# Patient Record
Sex: Female | Born: 1985 | Race: White | Hispanic: No | Smoking: Current every day smoker
Health system: Southern US, Community
[De-identification: ages and names within clinical notes are randomized; demographics above are authoritative.]

## PROBLEM LIST (undated history)

## (undated) DIAGNOSIS — C799 Secondary malignant neoplasm of unspecified site: Secondary | ICD-10-CM

## (undated) DIAGNOSIS — F419 Anxiety disorder, unspecified: Secondary | ICD-10-CM

## (undated) DIAGNOSIS — E079 Disorder of thyroid, unspecified: Secondary | ICD-10-CM

## (undated) DIAGNOSIS — T7840XA Allergy, unspecified, initial encounter: Secondary | ICD-10-CM

## (undated) HISTORY — DX: Allergy, unspecified, initial encounter: T78.40XA

## (undated) HISTORY — DX: Anxiety disorder, unspecified: F41.9

## (undated) HISTORY — DX: Disorder of thyroid, unspecified: E07.9

---
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
## 2006-05-11 ENCOUNTER — Inpatient Hospital Stay (HOSPITAL_COMMUNITY): Admission: AD | Admit: 2006-05-11 | Discharge: 2006-05-13 | Payer: Self-pay | Admitting: Obstetrics & Gynecology

## 2006-11-30 ENCOUNTER — Emergency Department (HOSPITAL_COMMUNITY): Admission: EM | Admit: 2006-11-30 | Discharge: 2006-11-30 | Payer: Self-pay | Admitting: Emergency Medicine

## 2007-07-12 ENCOUNTER — Emergency Department (HOSPITAL_COMMUNITY): Admission: EM | Admit: 2007-07-12 | Discharge: 2007-07-13 | Payer: Self-pay | Admitting: Emergency Medicine

## 2007-07-14 ENCOUNTER — Emergency Department (HOSPITAL_COMMUNITY): Admission: EM | Admit: 2007-07-14 | Discharge: 2007-07-14 | Payer: Self-pay | Admitting: Emergency Medicine

## 2007-09-23 ENCOUNTER — Emergency Department (HOSPITAL_COMMUNITY): Admission: EM | Admit: 2007-09-23 | Discharge: 2007-09-23 | Payer: Self-pay | Admitting: Emergency Medicine

## 2007-12-09 ENCOUNTER — Emergency Department (HOSPITAL_COMMUNITY): Admission: EM | Admit: 2007-12-09 | Discharge: 2007-12-09 | Payer: Self-pay | Admitting: Emergency Medicine

## 2008-09-08 ENCOUNTER — Emergency Department (HOSPITAL_COMMUNITY): Admission: EM | Admit: 2008-09-08 | Discharge: 2008-09-08 | Payer: Self-pay | Admitting: Emergency Medicine

## 2009-02-15 ENCOUNTER — Emergency Department (HOSPITAL_COMMUNITY): Admission: EM | Admit: 2009-02-15 | Discharge: 2009-02-15 | Payer: Self-pay | Admitting: Emergency Medicine

## 2011-03-25 LAB — I-STAT 8, (EC8 V) (CONVERTED LAB)
Acid-Base Excess: 1
BUN: 6
Bicarbonate: 25.5 — ABNORMAL HIGH
Glucose, Bld: 141 — ABNORMAL HIGH
HCT: 35 — ABNORMAL LOW
HCT: 43
Hemoglobin: 11.9 — ABNORMAL LOW
Hemoglobin: 14.6
Potassium: 3.4 — ABNORMAL LOW
Sodium: 136
Sodium: 137
pH, Ven: 7.41 — ABNORMAL HIGH

## 2011-03-25 LAB — CBC
Hemoglobin: 10.3 — ABNORMAL LOW
MCHC: 33.2
Platelets: 265
WBC: 12.5 — ABNORMAL HIGH

## 2011-03-25 LAB — POCT I-STAT CREATININE
Creatinine, Ser: 0.9
Creatinine, Ser: 0.9
Operator id: 294341

## 2011-03-25 LAB — URINALYSIS, ROUTINE W REFLEX MICROSCOPIC
Bilirubin Urine: NEGATIVE
Glucose, UA: NEGATIVE
Hgb urine dipstick: NEGATIVE
Ketones, ur: NEGATIVE
Nitrite: NEGATIVE
Protein, ur: NEGATIVE
Specific Gravity, Urine: 1.016
Urobilinogen, UA: 1
pH: 7.5

## 2011-03-25 LAB — DIFFERENTIAL
Basophils Absolute: 0
Eosinophils Absolute: 0
Eosinophils Absolute: 0.2
Eosinophils Relative: 0
Eosinophils Relative: 3
Monocytes Absolute: 0.3
Neutro Abs: 3.3

## 2011-03-25 LAB — WET PREP, GENITAL: Yeast Wet Prep HPF POC: NONE SEEN

## 2011-03-25 LAB — GC/CHLAMYDIA PROBE AMP, GENITAL: Chlamydia, DNA Probe: POSITIVE — AB

## 2011-03-29 LAB — URINALYSIS, ROUTINE W REFLEX MICROSCOPIC
Glucose, UA: NEGATIVE
Hgb urine dipstick: NEGATIVE
Ketones, ur: NEGATIVE
Protein, ur: NEGATIVE
Urobilinogen, UA: 0.2

## 2011-03-29 LAB — CBC
HCT: 40.5
Hemoglobin: 13.4
MCHC: 33.2
Platelets: 218
RDW: 16 — ABNORMAL HIGH
WBC: 5

## 2011-03-29 LAB — DIFFERENTIAL
Basophils Relative: 0
Eosinophils Absolute: 0.1
Eosinophils Relative: 3
Lymphs Abs: 1.8
Monocytes Relative: 6
Neutrophils Relative %: 55

## 2011-03-29 LAB — I-STAT 8, (EC8 V) (CONVERTED LAB)
BUN: 8
Glucose, Bld: 83
Hemoglobin: 14.6
Potassium: 4
Sodium: 141
pH, Ven: 7.354 — ABNORMAL HIGH

## 2011-03-29 LAB — POCT PREGNANCY, URINE: Preg Test, Ur: NEGATIVE

## 2011-03-29 LAB — POCT I-STAT CREATININE: Operator id: 265201

## 2011-04-01 LAB — DIFFERENTIAL
Basophils Relative: 0
Eosinophils Relative: 0
Lymphocytes Relative: 8 — ABNORMAL LOW
Monocytes Absolute: 0.5
Monocytes Relative: 4
Neutro Abs: 10 — ABNORMAL HIGH

## 2011-04-01 LAB — COMPREHENSIVE METABOLIC PANEL
AST: 20
Albumin: 4.1
Alkaline Phosphatase: 55
BUN: 6
GFR calc Af Amer: >60
Potassium: 3.5
Total Protein: 7.2

## 2011-04-01 LAB — POCT PREGNANCY, URINE
Operator id: 151321
Preg Test, Ur: NEGATIVE

## 2011-04-01 LAB — URINALYSIS, ROUTINE W REFLEX MICROSCOPIC
Ketones, ur: 15 — AB
Nitrite: NEGATIVE
Protein, ur: NEGATIVE
Urobilinogen, UA: 1

## 2011-04-01 LAB — CBC
HCT: 36.1
Platelets: 199
RDW: 13.8
WBC: 11.4 — ABNORMAL HIGH

## 2011-04-01 LAB — MONONUCLEOSIS SCREEN: Mono Screen: NEGATIVE

## 2019-05-06 ENCOUNTER — Emergency Department (HOSPITAL_COMMUNITY)
Admission: EM | Admit: 2019-05-06 | Discharge: 2019-05-06 | Disposition: A | Discharge status: Home / Self Care | Payer: Self-pay | Attending: Emergency Medicine | Admitting: Emergency Medicine

## 2019-05-06 ENCOUNTER — Encounter (HOSPITAL_COMMUNITY): Payer: Self-pay | Admitting: Emergency Medicine

## 2019-05-06 ENCOUNTER — Other Ambulatory Visit: Payer: Self-pay

## 2019-05-06 DIAGNOSIS — M25561 Pain in right knee: Secondary | ICD-10-CM | POA: Insufficient documentation

## 2019-05-06 DIAGNOSIS — Y999 Unspecified external cause status: Secondary | ICD-10-CM | POA: Insufficient documentation

## 2019-05-06 DIAGNOSIS — F17200 Nicotine dependence, unspecified, uncomplicated: Secondary | ICD-10-CM | POA: Insufficient documentation

## 2019-05-06 DIAGNOSIS — S39012A Strain of muscle, fascia and tendon of lower back, initial encounter: Secondary | ICD-10-CM | POA: Insufficient documentation

## 2019-05-06 DIAGNOSIS — Y92481 Parking lot as the place of occurrence of the external cause: Secondary | ICD-10-CM | POA: Insufficient documentation

## 2019-05-06 DIAGNOSIS — Y9389 Activity, other specified: Secondary | ICD-10-CM | POA: Insufficient documentation

## 2019-05-06 DIAGNOSIS — M25512 Pain in left shoulder: Secondary | ICD-10-CM | POA: Insufficient documentation

## 2019-05-06 MED ORDER — OXYCODONE HCL 5 MG PO TABS
5.0000 mg | ORAL_TABLET | Freq: Once | ORAL | Status: AC
  Administered 2019-05-06: 08:00:00 5 mg via ORAL
  Filled 2019-05-06: qty 1

## 2019-05-06 MED ORDER — ACETAMINOPHEN 500 MG PO TABS
1000.0000 mg | ORAL_TABLET | Freq: Once | ORAL | Status: AC
  Administered 2019-05-06: 08:00:00 1000 mg via ORAL
  Filled 2019-05-06: qty 2

## 2019-05-06 MED ORDER — KETOROLAC TROMETHAMINE 60 MG/2ML IM SOLN
15.0000 mg | Freq: Once | INTRAMUSCULAR | Status: DC
  Filled 2019-05-06: qty 2
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
## 2019-05-06 NOTE — ED Notes (Signed)
Patient verbalizes understanding of discharge instructions . Opportunity for questions and answers were provided . Armband removed by staff ,Pt discharged from ED. W/C  offered at D/C  and Declined W/C at D/C and was escorted to lobby by RN.  

## 2019-05-06 NOTE — Discharge Instructions (Signed)
Take 4 over the counter ibuprofen tablets 3 times a day or 2 over-the-counter naproxen tablets twice a day for pain. Also take tylenol 1000mg (2 extra strength) four times a day.   If your pain continues after a week then it may be reasonable to obtain imaging.  Please try and rest the areas as best you can.  Your back is something that needs to be up and moving so you cannot stay in a bed all day have to get up and move around.  Try not to lift anything greater than 10 pounds or bend or twist at the waist.
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
## 2019-12-18 ENCOUNTER — Emergency Department (HOSPITAL_COMMUNITY)
Admission: EM | Admit: 2019-12-18 | Discharge: 2019-12-18 | Discharge status: Against Medical Advice | Payer: Self-pay | Attending: Emergency Medicine | Admitting: Emergency Medicine

## 2019-12-18 ENCOUNTER — Emergency Department (HOSPITAL_COMMUNITY): Payer: Self-pay

## 2019-12-18 ENCOUNTER — Encounter (HOSPITAL_COMMUNITY): Payer: Self-pay | Admitting: Emergency Medicine

## 2019-12-18 ENCOUNTER — Other Ambulatory Visit: Payer: Self-pay

## 2019-12-18 DIAGNOSIS — R0789 Other chest pain: Secondary | ICD-10-CM | POA: Insufficient documentation

## 2019-12-18 DIAGNOSIS — Z5321 Procedure and treatment not carried out due to patient leaving prior to being seen by health care provider: Secondary | ICD-10-CM | POA: Insufficient documentation

## 2019-12-18 LAB — BASIC METABOLIC PANEL
Anion gap: 10 (ref 5–15)
BUN: <5 mg/dL — ABNORMAL LOW (ref 6–20)
CO2: 24 mmol/L (ref 22–32)
Calcium: 9 mg/dL (ref 8.9–10.3)
Chloride: 105 mmol/L (ref 98–111)
Creatinine, Ser: 0.54 mg/dL (ref 0.44–1.00)
GFR calc Af Amer: >60 mL/min (ref >60)
GFR calc non Af Amer: >60 mL/min (ref >60)
Glucose, Bld: 85 mg/dL (ref 70–99)
Potassium: 4 mmol/L (ref 3.5–5.1)
Sodium: 139 mmol/L (ref 135–145)

## 2019-12-18 LAB — CBC
HCT: 45.7 % (ref 36.0–46.0)
Hemoglobin: 15.6 g/dL — ABNORMAL HIGH (ref 12.0–15.0)
MCH: 31.6 pg (ref 26.0–34.0)
MCHC: 34.1 g/dL (ref 30.0–36.0)
MCV: 92.5 fL (ref 80.0–100.0)
Platelets: 203 10*3/uL (ref 150–400)
RBC: 4.94 MIL/uL (ref 3.87–5.11)
RDW: 12.8 % (ref 11.5–15.5)
WBC: 5.6 10*3/uL (ref 4.0–10.5)
nRBC: 0 % (ref 0.0–0.2)

## 2019-12-18 LAB — TROPONIN I (HIGH SENSITIVITY): Troponin I (High Sensitivity): 3 ng/L (ref <18)

## 2019-12-18 MED ORDER — SODIUM CHLORIDE 0.9% FLUSH: 3.0000 mL | Freq: Once | INTRAVENOUS | Status: DC

## 2019-12-18 NOTE — ED Triage Notes (Signed)
Patient arrives to ED with complaints of left sided hand pain starting last week. Patient states since the hand pain started she has gotten chest pains intermittently. The hand pain is constant. Patient stated that her left hand started to hurt today as well.

## 2019-12-18 NOTE — ED Notes (Signed)
Registration staff reported that patient left the ER.

## 2019-12-19 ENCOUNTER — Encounter (HOSPITAL_COMMUNITY): Payer: Self-pay

## 2019-12-19 ENCOUNTER — Emergency Department (HOSPITAL_COMMUNITY)
Admission: EM | Admit: 2019-12-19 | Discharge: 2019-12-19 | Disposition: A | Discharge status: Home / Self Care | Payer: PRIVATE HEALTH INSURANCE | Attending: Emergency Medicine | Admitting: Emergency Medicine

## 2019-12-19 ENCOUNTER — Other Ambulatory Visit: Payer: Self-pay

## 2019-12-19 DIAGNOSIS — Z880 Allergy status to penicillin: Secondary | ICD-10-CM | POA: Insufficient documentation

## 2019-12-19 DIAGNOSIS — G5601 Carpal tunnel syndrome, right upper limb: Secondary | ICD-10-CM

## 2019-12-19 DIAGNOSIS — F17210 Nicotine dependence, cigarettes, uncomplicated: Secondary | ICD-10-CM | POA: Insufficient documentation

## 2019-12-19 MED ORDER — DEXAMETHASONE SODIUM PHOSPHATE 10 MG/ML IJ SOLN
10.0000 mg | Freq: Once | INTRAMUSCULAR | Status: DC
  Filled 2019-12-19: qty 1

## 2019-12-19 MED ORDER — DEXAMETHASONE SODIUM PHOSPHATE 10 MG/ML IJ SOLN
10.0000 mg | Freq: Once | INTRAMUSCULAR | Status: AC
  Administered 2019-12-19: 10 mg via INTRAMUSCULAR

## 2019-12-19 NOTE — ED Triage Notes (Addendum)
Patient reports right and numbness and swelling and causes sharp pain to radiate up her arm. Patient states now her left hand is hurting.  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   10/10 pain   Patient seen yesterday at [place]. Blood work, Editor, commissioning, and chest xray in chart.

## 2019-12-19 NOTE — Discharge Instructions (Signed)
Please continue to take ibuprofen for management of your pain.  I would recommend 2 to 400 mg of ibuprofen 2-3 times per day.  Please be sure to take this medication with food to prevent stomach irritation.  Please wear the wrist splints as much as you can tolerate.  Particularly, while you are at work.  If your symptoms worsen you can always return to the emergency department.  Please follow-up with  community health and wellness regarding this visit as well as your symptoms.  It was a pleasure to meet you.

## 2019-12-19 NOTE — ED Provider Notes (Signed)
[place] [facility] DEPT Provider Note   CSN: [id number] Arrival date & time: 12/19/19  1248     History Chief Complaint  Patient presents with  . Hand Pain    Natalie Hinton is a 34 y.o. female.  HPI   Patient is a 34 year old female that is complaining with right greater than left wrist pain.  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  Her symptoms started shortly thereafter.  Patient reports 10/10 pain along the volar surface of the right wrist that radiates through her hand and upper arm.  In the last few days she additionally reports mild pain that began in the same region of her left hand.  Her symptoms are worst at night.  She reports associated numbness and tingling when the pain radiates.  She is right-hand dominant.  She denies fevers, chills, chest pain, shortness of breath.     History reviewed. No pertinent past medical history.  There are no problems to display for this patient.   History reviewed. No pertinent surgical history.   OB History   No obstetric history on file.     No family history on file.  Social History   Tobacco Use  . Smoking status: Current Every Day Smoker  . Smokeless tobacco: Never Used  Substance Use Topics  . Alcohol use: Never  . Drug use: Yes    Types: Marijuana    Home Medications Prior to Admission medications   Not on File    Allergies    Penicillins  Review of Systems   Review of Systems  Constitutional: Negative for chills and fever.  Respiratory: Negative for shortness of breath.   Cardiovascular: Negative for chest pain.  Musculoskeletal: Positive for arthralgias and myalgias.  Neurological: Positive for weakness and numbness.   Physical Exam Updated Vital Signs BP 116/81   Pulse 93   Temp 97.8 F (36.6 C) (Oral)   Resp 16   LMP 11/18/2019   SpO2 99%   Physical Exam Vitals and nursing note reviewed.  Constitutional:      General:  She is not in acute distress.    Appearance: Normal appearance. She is not ill-appearing, toxic-appearing or diaphoretic.  HENT:     Head: Normocephalic and atraumatic.     Right Ear: External ear normal.     Left Ear: External ear normal.     Nose: Nose normal.     Mouth/Throat:     Mouth: Mucous membranes are moist.     Pharynx: Oropharynx is clear. No oropharyngeal exudate or posterior oropharyngeal erythema.  Eyes:     Extraocular Movements: Extraocular movements intact.  Cardiovascular:     Rate and Rhythm: Normal rate and regular rhythm.     Pulses: Normal pulses.     Heart sounds: Normal heart sounds. No murmur heard.  No friction rub. No gallop.   Pulmonary:     Effort: Pulmonary effort is normal. No respiratory distress.     Breath sounds: Normal breath sounds. No stridor. No wheezing, rhonchi or rales.  Abdominal:     General: Abdomen is flat.     Tenderness: There is no abdominal tenderness.  Musculoskeletal:        General: Tenderness present. No swelling, deformity or signs of injury. Normal range of motion.     Cervical back: Normal range of motion and neck supple. No tenderness.     Comments: Exquisite TTP noted over the volar  surface of the bilateral wrists, right greater than left.  Positive Tinel's sign.  Positive Phalen test.  Full range of motion of the digits of both hands as well as both wrists.  Unable to assess grip strength of the right hand secondary to pain.  Skin:    General: Skin is warm and dry.  Neurological:     General: No focal deficit present.     Mental Status: She is alert and oriented to person, place, and time.     Comments: Distal sensation intact in all the fingers of the hands.  Psychiatric:        Mood and Affect: Mood normal.        Behavior: Behavior normal.    ED Results / Procedures / Treatments   Labs (all labs ordered are listed, but only abnormal results are displayed) Labs Reviewed - No data to  display  EKG None  Radiology DG Chest 2 View  Result Date: 12/18/2019 CLINICAL DATA:  Onset left hand pain 1 week ago. EXAM: CHEST - 2 VIEW COMPARISON:  PA and lateral chest 12/09/2007. FINDINGS: Lungs clear. Heart size normal. No pneumothorax or pleural fluid. No bony abnormality. IMPRESSION: Normal chest. Electronically Signed   By: Inge Rise M.D.   On: 12/18/2019 17:36   DG Hand 2 View Right  Result Date: 12/18/2019 CLINICAL DATA:  Hand pain EXAM: RIGHT HAND - 2 VIEW COMPARISON:  None. FINDINGS: Frontal and lateral views of the right hand are obtained. No fracture, subluxation, or dislocation. Joint spaces are well preserved. Soft tissues are normal. IMPRESSION: 1. Unremarkable right hand. Electronically Signed   By: Randa Ngo M.D.   On: 12/18/2019 17:53   Procedures Procedures   Medications Ordered in ED Medications - No data to display  ED Course  I have reviewed the triage vital signs and the nursing notes.  Pertinent labs & imaging results that were available during my care of the patient were reviewed by me and considered in my medical decision making (see chart for details).    MDM Rules/Calculators/A&P                          Patient is a 34 year old female that presents due to signs and symptoms consistent with carpal tunnel syndrome.  Patient does not have health insurance. She was seen yesterday in the emergency department but left prior to being evaluated by an EDP. She had an x-ray performed of the right hand which was negative. Physical exam today was significant for positive Tinel's sign and Phalen's test. Her symptoms seem to be worst at night.  I am going to give the patient a referral to [facility] community health and wellness so that she has PCP follow-up following this visit. She was given 10 mg of Decadron here in the emergency department. I recommended a course of ibuprofen for management of her pain and to reduce inflammation as well. We discussed  proper dosing. Patient was given bilateral wrist splints.  Patient was given return precautions. She understands she can return to the emergency department with any new or worsening symptoms. Her questions were answered and she was amicable at the time of discharge. Her vital signs are stable.  Patient discharged to home/self care.  Condition at discharge: Stable  Note: Portions of this report may have been transcribed using voice recognition software. Every effort was made to ensure accuracy; however, inadvertent computerized transcription errors may be present.  Final Clinical Impression(s) / ED Diagnoses Final diagnoses:  Carpal tunnel syndrome of right wrist    Rx / DC Orders ED Discharge Orders    None       Rayna Sexton, PA-C 12/19/19 1444    Long, [facility], MD 12/20/19 0740

## 2019-12-28 ENCOUNTER — Ambulatory Visit: Payer: Self-pay | Attending: Internal Medicine | Admitting: Family

## 2019-12-28 ENCOUNTER — Other Ambulatory Visit: Payer: Self-pay

## 2019-12-28 DIAGNOSIS — G5601 Carpal tunnel syndrome, right upper limb: Secondary | ICD-10-CM

## 2019-12-28 DIAGNOSIS — Z09 Encounter for follow-up examination after completed treatment for conditions other than malignant neoplasm: Secondary | ICD-10-CM

## 2019-12-28 NOTE — Progress Notes (Signed)
Virtual Visit via Telephone Note  I connected with Natalie Hinton, on 12/28/2019 at 9:49 AM by telephone due to the COVID-19 pandemic and verified that I am speaking with the correct person using two identifiers.  Due to current restrictions/limitations of in-office visits due to the COVID-19 pandemic, this scheduled clinical appointment was converted to a telehealth visit.   Consent: I discussed the limitations, risks, security and privacy concerns of performing an evaluation and management service by telephone and the availability of in person appointments. I also discussed with the patient that there may be a patient responsible charge related to this service. The patient expressed understanding and agreed to proceed.  Location of Patient: Home  Location of Provider: [facility] and [place] participating in Telemedicine visit: Sunday M Sedonia Small, NP Orlan Leavens, CMA  History of Present Illness: Natalie Hinton is a 34 year old female without significant medical history on file who presents for hospital follow-up.   1. ER FOLLOW UP: Time since discharge: 9 days Hospital/facility: [facility] emergency department  Diagnosis: carpal tunnel syndrome of right wrist Procedures/tests: chest x-ray, right hand x-ray Consultants: none New medications: none Discharge instructions:  Ibuprofen for management of pain and to reduce inflammation also given bilateral wrist splints Status: stable  Today reports right hand is still numb and lasts all day. Radiation to right arm. Reports swelling is gone. Reports she has been using splints and taking Ibuprofen as prescribed and that it is helping some. Reports aggravating factor is when the right wrist is touched. Reports she does lay on hands at night. 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   No past medical history on file. Allergies  Allergen Reactions  . Penicillins     No  current outpatient medications on file prior to visit.   No current facility-administered medications on file prior to visit.    Observations/Objective: Alert and oriented x 3. Not in acute distress. Physical examination not completed as this is a telemedicine visit.  Assessment and Plan: 1. Hospital discharge follow-up: -Last visit at the [facility] emergency department. During that encounter patient discharged home with Ibuprofen for management of pain and to reduce inflammation also given bilateral wrist splints. -Today patient reports she is in stable condition since discharge from hospital.  2. Carpal tunnel syndrome of right wrist: -Patient with right wrist carpal tunnel syndrome. Reports continued numbness with radiation to the right forearm. Numbness lasts all day. Denies swelling. 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 -Continue Ibuprofen, ice, rest, hand exercises, and wrist splint as needed. -Follow-up with primary physician if symptoms worsen and or becomes severe. Patient verbalized understanding. -Patient was given clear instructions to go to Emergency Department or return to medical center if symptoms don't improve, worsen, or new problems develop.The patient verbalized understanding.  Follow Up Instructions: Patient was given clear instructions to go to Emergency Department or return to medical center if symptoms don't improve, worsen, or new problems develop.The patient verbalized understanding.  I discussed the assessment and treatment plan with the patient. The patient was provided an opportunity to ask questions and all were answered. The patient agreed with the plan and demonstrated an understanding of the instructions.   The patient was advised to call back or seek an in-person evaluation if the symptoms worsen or if the condition fails to improve as anticipated.  I provided 10 minutes total of non-face-to-face time during this encounter  including median  intraservice time, reviewing previous notes, labs, imaging, medications, management and patient verbalized understanding.   Camillia Herter, NP  [facility] [place], [place]   12/28/2019, 9:37 AM

## 2019-12-28 NOTE — Patient Instructions (Addendum)
Carpal Tunnel Syndrome  Carpal tunnel syndrome is a condition that causes pain in your hand and arm. The carpal tunnel is a narrow area that is on the palm side of your wrist. Repeated wrist motion or certain diseases may cause swelling in the tunnel. This swelling can pinch the main nerve in the wrist (median nerve). What are the causes? This condition may be caused by:  Repeated wrist motions.  Wrist injuries.  Arthritis.  A sac of fluid (cyst) or abnormal growth (tumor) in the carpal tunnel.  Fluid buildup during pregnancy. Sometimes the cause is not known. What increases the risk? The following factors may make you more likely to develop this condition:  Having a job in which you move your wrist in the same way many times. This includes jobs like being a butcher or a cashier.  Being a woman.  Having other health conditions, such as: ? Diabetes. ? Obesity. ? A thyroid gland that is not active enough (hypothyroidism). ? Kidney failure. What are the signs or symptoms? Symptoms of this condition include:  A tingling feeling in your fingers.  Tingling or a loss of feeling (numbness) in your hand.  Pain in your entire arm. This pain may get worse when you bend your wrist and elbow for a long time.  Pain in your wrist that goes up your arm to your shoulder.  Pain that goes down into your palm or fingers.  A weak feeling in your hands. You may find it hard to grab and hold items. You may feel worse at night. How is this diagnosed? This condition is diagnosed with a medical history and physical exam. You may also have tests, such as:  Electromyogram (EMG). This test checks the signals that the nerves send to the muscles.  Nerve conduction study. This test checks how well signals pass through your nerves.  Imaging tests, such as X-rays, ultrasound, and MRI. These tests check for what might be the cause of your condition. How is this treated? This condition may be treated  with:  Lifestyle changes. You will be asked to stop or change the activity that caused your problem.  Doing exercise and activities that make bones and muscles stronger (physical therapy).  Learning how to use your hand again (occupational therapy).  Medicines for pain and swelling (inflammation). You may have injections in your wrist.  A wrist splint.  Surgery. Follow these instructions at home: If you have a splint:  Wear the splint as told by your doctor. Remove it only as told by your doctor.  Loosen the splint if your fingers: ? Tingle. ? Lose feeling (become numb). ? Turn cold and blue.  Keep the splint clean.  If the splint is not waterproof: ? Do not let it get wet. ? Cover it with a watertight covering when you take a bath or a shower. Managing pain, stiffness, and swelling   If told, put ice on the painful area: ? If you have a removable splint, remove it as told by your doctor. ? Put ice in a plastic bag. ? Place a towel between your skin and the bag. ? Leave the ice on for 20 minutes, 2-3 times per day. General instructions  Take over-the-counter and prescription medicines only as told by your doctor.  Rest your wrist from any activity that may cause pain. If needed, talk with your boss at work about changes that can help your wrist heal.  Do any exercises as told by your doctor,   physical therapist, or occupational therapist.  Keep all follow-up visits as told by your doctor. This is important. Contact a doctor if:  You have new symptoms.  Medicine does not help your pain.  Your symptoms get worse. Get help right away if:  You have very bad numbness or tingling in your wrist or hand. Summary  Carpal tunnel syndrome is a condition that causes pain in your hand and arm.  It is often caused by repeated wrist motions.  Lifestyle changes and medicines are used to treat this problem. Surgery may help in very bad cases.  Follow your doctor's  instructions about wearing a splint, resting your wrist, keeping follow-up visits, and calling for help. This information is not intended to replace advice given to you by your health care provider. Make sure you discuss any questions you have with your health care provider. Document Revised: 10/28/2017 Document Reviewed: 10/28/2017 Elsevier Patient Education  2020 [employer]  

## 2020-01-16 ENCOUNTER — Emergency Department (HOSPITAL_COMMUNITY): Payer: No Typology Code available for payment source

## 2020-01-16 ENCOUNTER — Encounter (HOSPITAL_COMMUNITY): Payer: Self-pay

## 2020-01-16 ENCOUNTER — Emergency Department (HOSPITAL_COMMUNITY)
Admission: EM | Admit: 2020-01-16 | Discharge: 2020-01-16 | Disposition: A | Discharge status: Home / Self Care | Payer: No Typology Code available for payment source | Attending: Emergency Medicine | Admitting: Emergency Medicine

## 2020-01-16 ENCOUNTER — Other Ambulatory Visit: Payer: Self-pay

## 2020-01-16 DIAGNOSIS — Y929 Unspecified place or not applicable: Secondary | ICD-10-CM | POA: Insufficient documentation

## 2020-01-16 DIAGNOSIS — Y939 Activity, unspecified: Secondary | ICD-10-CM | POA: Diagnosis not present

## 2020-01-16 DIAGNOSIS — F17200 Nicotine dependence, unspecified, uncomplicated: Secondary | ICD-10-CM | POA: Diagnosis not present

## 2020-01-16 DIAGNOSIS — S39012A Strain of muscle, fascia and tendon of lower back, initial encounter: Secondary | ICD-10-CM

## 2020-01-16 DIAGNOSIS — Y999 Unspecified external cause status: Secondary | ICD-10-CM | POA: Diagnosis not present

## 2020-01-16 DIAGNOSIS — W010XXA Fall on same level from slipping, tripping and stumbling without subsequent striking against object, initial encounter: Secondary | ICD-10-CM | POA: Diagnosis not present

## 2020-01-16 DIAGNOSIS — S34109A Unspecified injury to unspecified level of lumbar spinal cord, initial encounter: Secondary | ICD-10-CM | POA: Diagnosis present

## 2020-01-16 LAB — PREGNANCY, URINE: Preg Test, Ur: NEGATIVE

## 2020-01-16 MED ORDER — NAPROXEN 500 MG PO TABS
500.0000 mg | ORAL_TABLET | Freq: Two times a day (BID) | ORAL | 0 refills | Status: DC
Start: 2020-01-16 — End: 2022-07-14

## 2020-01-16 NOTE — Discharge Instructions (Signed)
Take the medications to help with your pain. Follow-up with your primary care provider. Return to the ER for worsening pain, additional injuries or falls, losing control of your bowels or bladder, numbness or weakness in your legs.

## 2020-01-16 NOTE — ED Triage Notes (Addendum)
Pt states Monday night at work she fell on her back. Pt states since then she has had lower right back pain that radiates to her tail bone. Pt states she took ibuprofen for her back pain yesterday with some relief. Pt denies hitting her head of LOC. Pt is ambulatory and A&Ox4.

## 2020-01-16 NOTE — ED Provider Notes (Signed)
[facility] DEPT Provider Note   CSN: [id number] Arrival date & time: 01/16/20  1537     History Chief Complaint  Patient presents with  . Back Pain    Natalie Hinton is a 34 y.o. female who presents to ED with a chief complaint of lower back pain and tailbone pain.  States that 2 days ago she slipped on a tomato on the floor at work and landed on her back.  She denies any head injury or loss of consciousness.  Since then she has had aching pain in her lower back near her tailbone.  She took ibuprofen yesterday with some improvement in her symptoms.  Pain is worse with palpation and ambulation.  Denies any loss of bowel or bladder function, fever, prior back surgeries, numbness in arms or legs, dysuria.  She denies possibility of pregnancy.  HPI     History reviewed. No pertinent past medical history.  There are no problems to display for this patient.   History reviewed. No pertinent surgical history.   OB History   No obstetric history on file.     History reviewed. No pertinent family history.  Social History   Tobacco Use  . Smoking status: Current Every Day Smoker  . Smokeless tobacco: Never Used  Substance Use Topics  . Alcohol use: Never  . Drug use: Yes    Types: Marijuana    Home Medications Prior to Admission medications   Medication Sig Start Date End Date Taking? Authorizing Provider  naproxen (NAPROSYN) 500 MG tablet Take 1 tablet (500 mg total) by mouth 2 (two) times daily. 01/16/20   Sherrye Puga, PA-C    Allergies    Penicillins  Review of Systems   Review of Systems  Constitutional: Negative for chills and fever.  Genitourinary: Negative for dysuria.  Musculoskeletal: Positive for back pain.  Skin: Negative for wound.  Neurological: Negative for weakness and numbness.    Physical Exam Updated Vital Signs BP 140/89   Pulse 97   Temp 98.1 F (36.7 C)   Ht 5' (1.524 m)   Wt 49.9 kg   SpO2 99%   BMI  21.48 kg/m   Physical Exam Vitals and nursing note reviewed.  Constitutional:      General: She is not in acute distress.    Appearance: She is well-developed. She is not diaphoretic.  HENT:     Head: Normocephalic and atraumatic.  Eyes:     General: No scleral icterus.    Conjunctiva/sclera: Conjunctivae normal.  Pulmonary:     Effort: Pulmonary effort is normal. No respiratory distress.  Musculoskeletal:     Cervical back: Normal range of motion.     Lumbar back: Tenderness present.       Back:     Comments: TTP of the lumbar spine at the midline.  No step-off palpated. No visible bruising, edema or temperature change noted. No objective signs of numbness present. No saddle anesthesia. 2+ DP pulses bilaterally. Sensation intact to light touch. Strength 5/5 in bilateral lower extremities.  Skin:    Findings: No rash.  Neurological:     Mental Status: She is alert.     ED Results / Procedures / Treatments   Labs (all labs ordered are listed, but only abnormal results are displayed) Labs Reviewed  PREGNANCY, URINE    EKG None  Radiology DG Lumbar Spine Complete  Result Date: 01/16/2020 CLINICAL DATA:  [place] at work 2 days ago, lower right back pain  radiating to coccyx EXAM: LUMBAR SPINE - COMPLETE 4+ VIEW COMPARISON:  None. FINDINGS: Frontal, bilateral oblique, lateral views of the lumbar spine are obtained. There are 5 non-rib-bearing lumbar type vertebral bodies in anatomic alignment. No fractures. Disc spaces are well preserved. Sacroiliac joints are normal. IMPRESSION: 1. Unremarkable lumbar spine. Electronically Signed   By: Randa Ngo M.D.   On: 01/16/2020 17:46   DG Sacrum/Coccyx  Result Date: 01/16/2020 CLINICAL DATA:  Fall on back. Fell Monday night at work. Lower right back pain radiating to the tailbone. EXAM: SACRUM AND COCCYX - 2+ VIEW COMPARISON:  None. FINDINGS: The cortical margins of the sacrum and coccyx are intact. The sacral ala are maintained. There  is no evidence of fracture or other focal bone lesions. Sacroiliac joints are congruent. IMPRESSION: Negative radiographs of the sacrum and coccyx. Electronically Signed   By: Keith Rake M.D.   On: 01/16/2020 17:45    Procedures Procedures (including critical care time)  Medications Ordered in ED Medications - No data to display  ED Course  I have reviewed the triage vital signs and the nursing notes.  Pertinent labs & imaging results that were available during my care of the patient were reviewed by me and considered in my medical decision making (see chart for details).    MDM Rules/Calculators/A&P                          34 year old female presenting to the ED with a chief complaint of lower back pain and tailbone pain after fall onto her back 2 days ago while at work.  Reports mechanical fall.  Denies any head injury or loss of consciousness.  Some improvement noted with NSAIDs.  No loss of bowel or bladder function, fever, prior back surgeries, numbness in arms or legs.  On exam there is tenderness palpation of the lower lumbar spine at the midline.  She remains ambulatory without weakness or objective signs of numbness noted in her lower extremities.  X-rays of the sacrum, coccyx and lumbar spine show no acute abnormalities.  Suspect that symptoms could be due to contusion.  No evidence of cauda equina, other infectious or vascular cause of symptoms.  Will treat with NSAIDs, heat and PCP follow-up. Pregnancy test negative here.  All imaging, if done today, including plain films, CT scans, and ultrasounds, independently reviewed by me, and interpretations confirmed via formal radiology reads.  Patient is hemodynamically stable, in NAD, and able to ambulate in the ED. Evaluation does not show pathology that would require ongoing emergent intervention or inpatient treatment. I explained the diagnosis to the patient. Pain has been managed and has no complaints prior to discharge.  Patient is comfortable with above plan and is stable for discharge at this time. All questions were answered prior to disposition. Strict return precautions for returning to the ED were discussed. Encouraged follow up with PCP.   An After Visit Summary was printed and given to the patient.   Portions of this note were generated with [employer]. Dictation errors may occur despite best attempts at proofreading.  Final Clinical Impression(s) / ED Diagnoses Final diagnoses:  Strain of lumbar region, initial encounter    Rx / DC Orders ED Discharge Orders         Ordered    naproxen (NAPROSYN) 500 MG tablet  2 times daily     Discontinue  Reprint     01/16/20 1820  Delia Heady, PA-C 01/16/20 1821    Lacretia Leigh, MD 01/17/20 [phone]

## 2020-02-09 ENCOUNTER — Encounter (HOSPITAL_COMMUNITY): Payer: Self-pay

## 2020-02-09 ENCOUNTER — Emergency Department (HOSPITAL_COMMUNITY): Payer: Self-pay

## 2020-02-09 ENCOUNTER — Emergency Department (HOSPITAL_COMMUNITY)
Admission: EM | Admit: 2020-02-09 | Discharge: 2020-02-10 | Disposition: A | Discharge status: Home / Self Care | Payer: Self-pay | Attending: Emergency Medicine | Admitting: Emergency Medicine

## 2020-02-09 DIAGNOSIS — Y9389 Activity, other specified: Secondary | ICD-10-CM | POA: Insufficient documentation

## 2020-02-09 DIAGNOSIS — R0781 Pleurodynia: Secondary | ICD-10-CM | POA: Insufficient documentation

## 2020-02-09 DIAGNOSIS — Y9289 Other specified places as the place of occurrence of the external cause: Secondary | ICD-10-CM | POA: Insufficient documentation

## 2020-02-09 DIAGNOSIS — Y998 Other external cause status: Secondary | ICD-10-CM | POA: Insufficient documentation

## 2020-02-09 DIAGNOSIS — F17200 Nicotine dependence, unspecified, uncomplicated: Secondary | ICD-10-CM | POA: Insufficient documentation
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
## 2020-02-10 MED ORDER — OXYCODONE-ACETAMINOPHEN 5-325 MG PO TABS
1.0000 | ORAL_TABLET | Freq: Once | ORAL | Status: AC
  Administered 2020-02-10: 1 via ORAL
  Filled 2020-02-10: qty 1

## 2020-02-10 MED ORDER — KETOROLAC TROMETHAMINE 60 MG/2ML IM SOLN
30.0000 mg | Freq: Once | INTRAMUSCULAR | Status: AC
  Administered 2020-02-10: 30 mg via INTRAMUSCULAR
  Filled 2020-02-10: qty 2

## 2020-02-10 MED ORDER — IBUPROFEN 400 MG PO TABS
400.0000 mg | ORAL_TABLET | Freq: Three times a day (TID) | ORAL | 0 refills | Status: AC
Start: 2020-02-10 — End: 2020-02-17

## 2020-02-10 NOTE — ED Notes (Signed)
Discharge instructions discussed with pt. Pt verbalized understanding. Pt stable and ambulatory. No signature pad avialable 
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
## 2021-11-20 IMAGING — CR DG LUMBAR SPINE COMPLETE 4+V
5 series · 5 of 5 positions shown · non-contrast
Comparison: None.

CLINICAL DATA: Fell at work 2 days ago, lower right back pain
radiating to coccyx

EXAM:
LUMBAR SPINE - COMPLETE 4+ VIEW

[t lumbar spine ap]
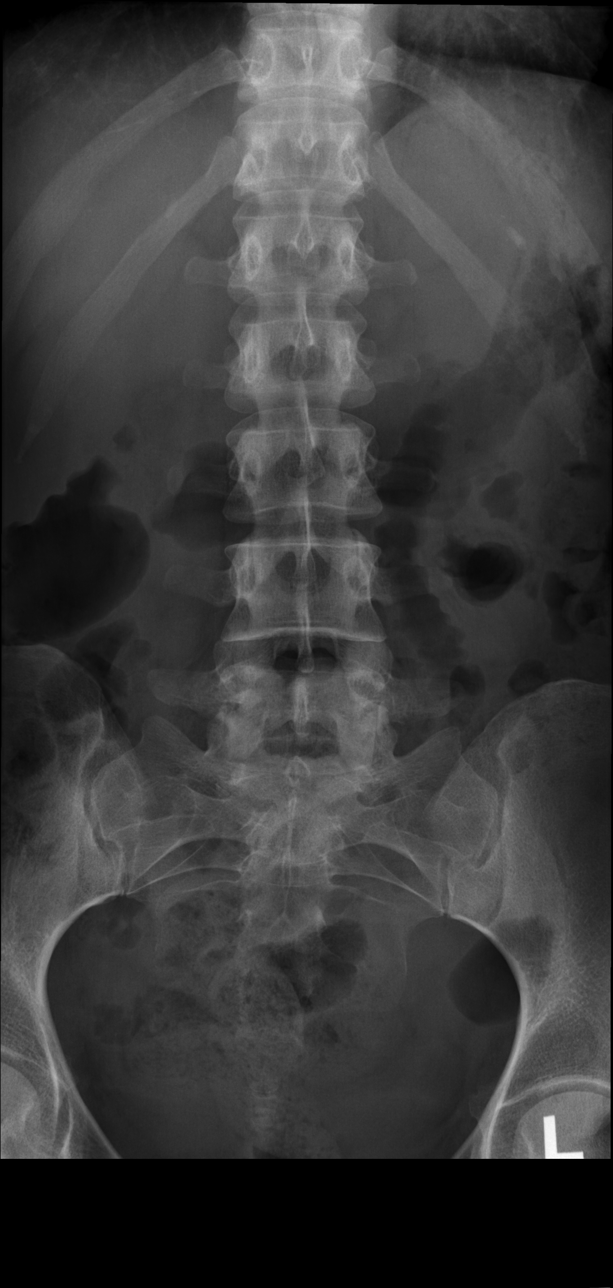

[t lumbar spine obl (1 of 2)]
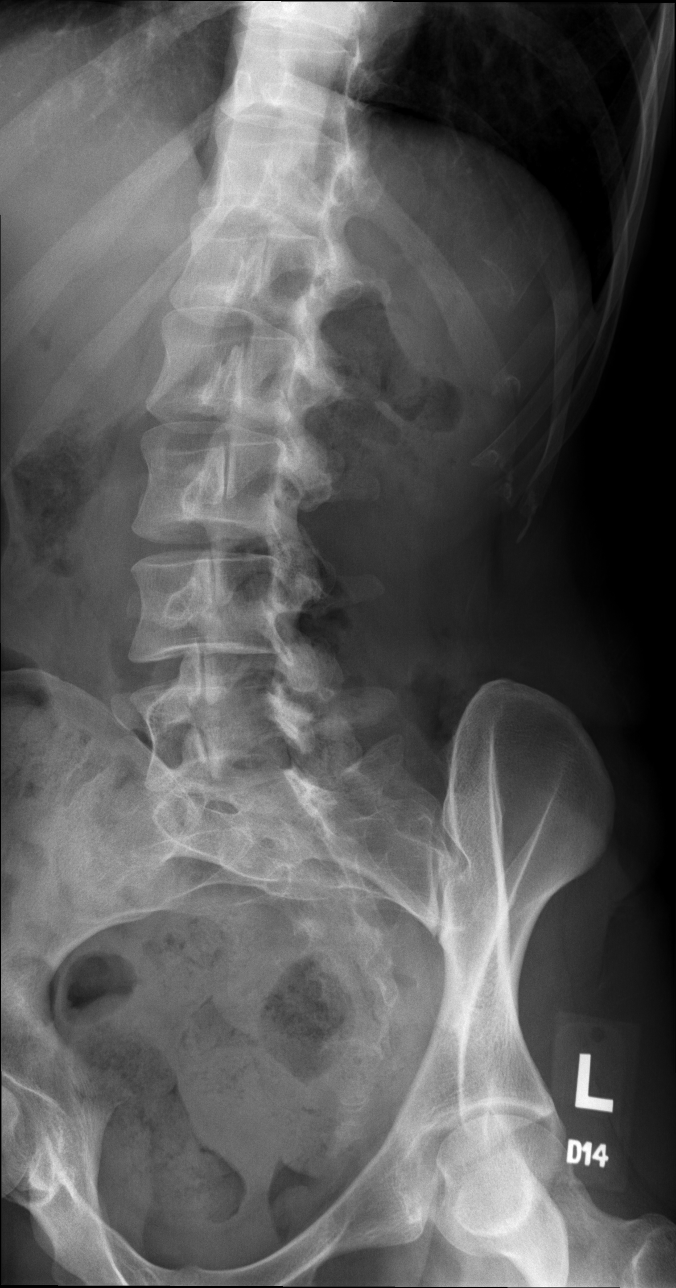

[t lumbar spine obl (2 of 2)]
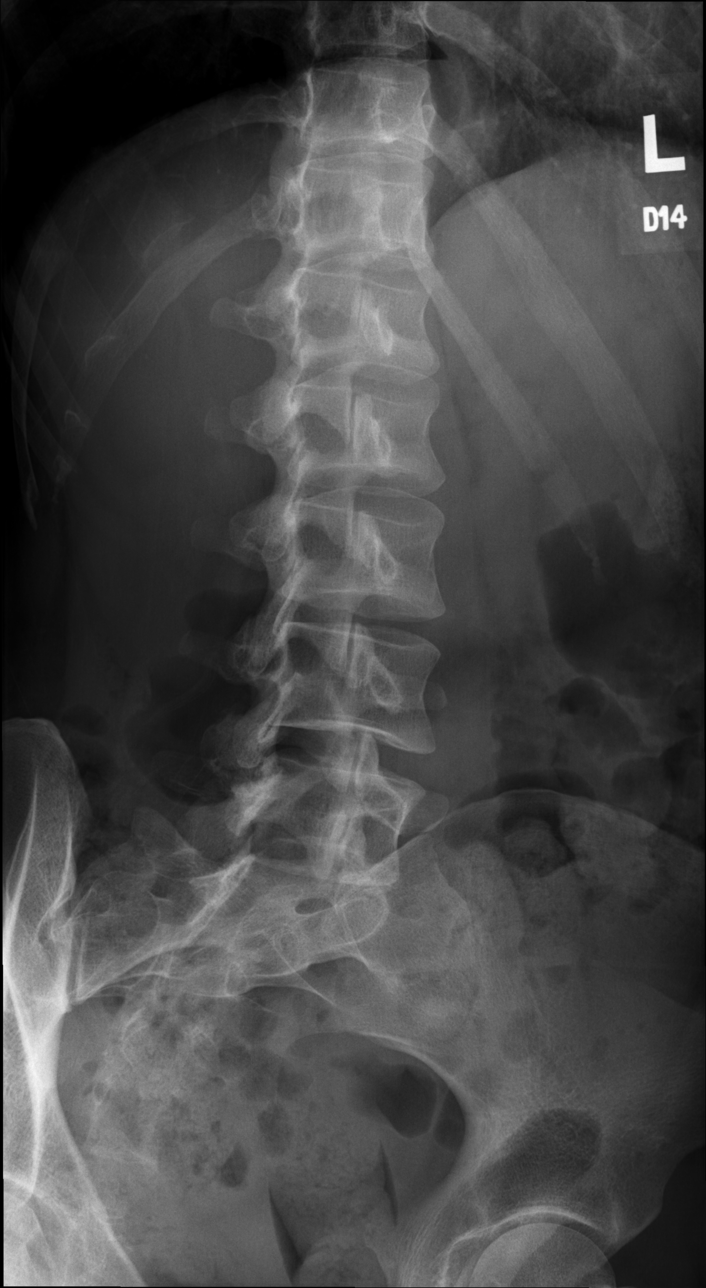

[t lumbar spine lat]
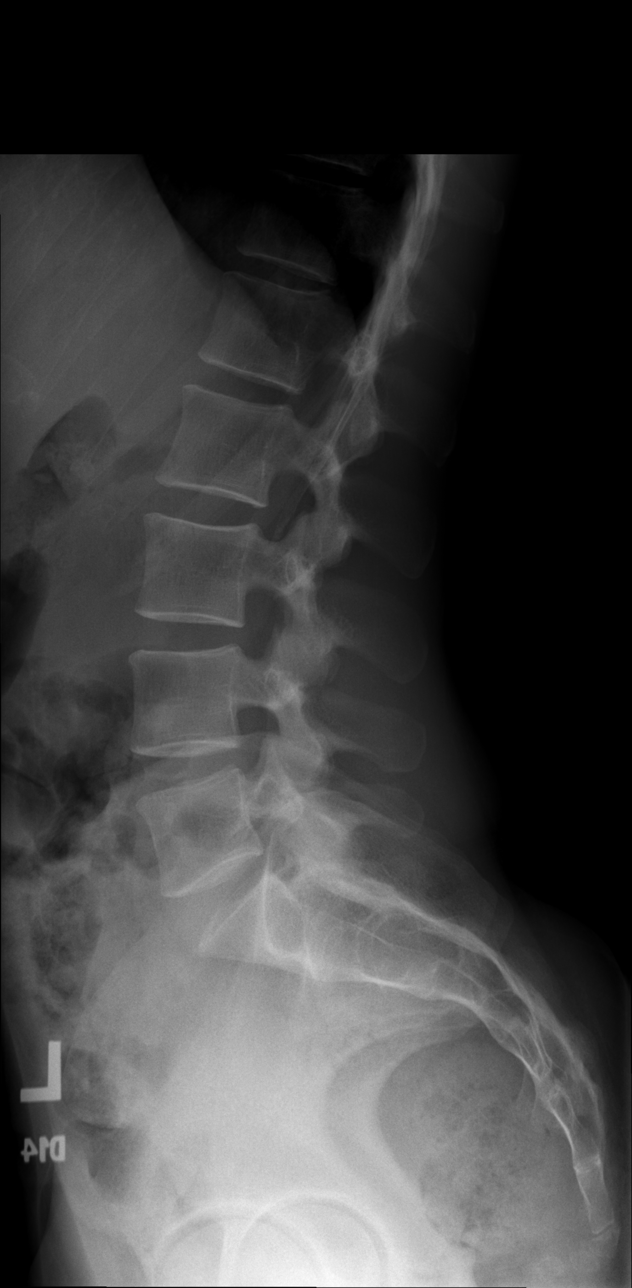

[t lumbar l-5 s-1 spot]
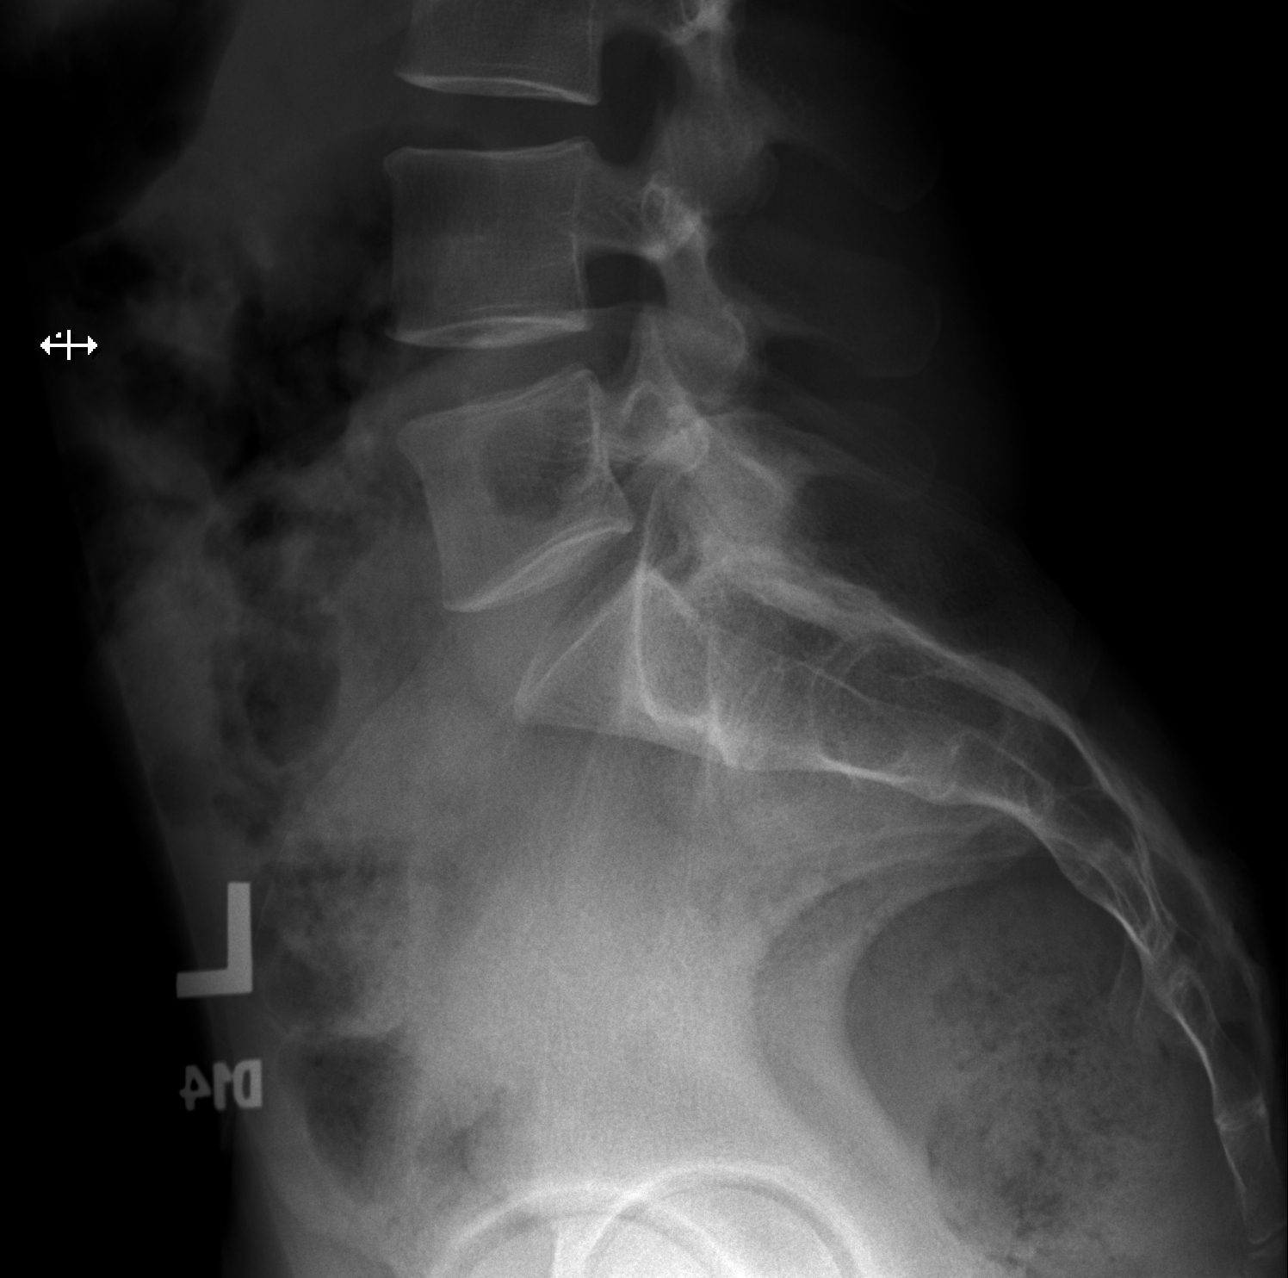

[5 of 5 positions shown; findings below may reference images not displayed]

FINDINGS: Frontal, bilateral oblique, lateral views of the lumbar spine are
obtained. There are 5 non-rib-bearing lumbar type vertebral bodies
in anatomic alignment. No fractures. Disc spaces are well preserved.
Sacroiliac joints are normal.
IMPRESSION: 1. Unremarkable lumbar spine.

## 2021-12-14 IMAGING — CR DG RIBS W/ CHEST 3+V*R*
3 series · 3 of 3 positions shown · non-contrast
Comparison: December 18, 2019

CLINICAL DATA: Pain.

EXAM:
RIGHT RIBS AND CHEST - 3+ VIEW

[chest pa]
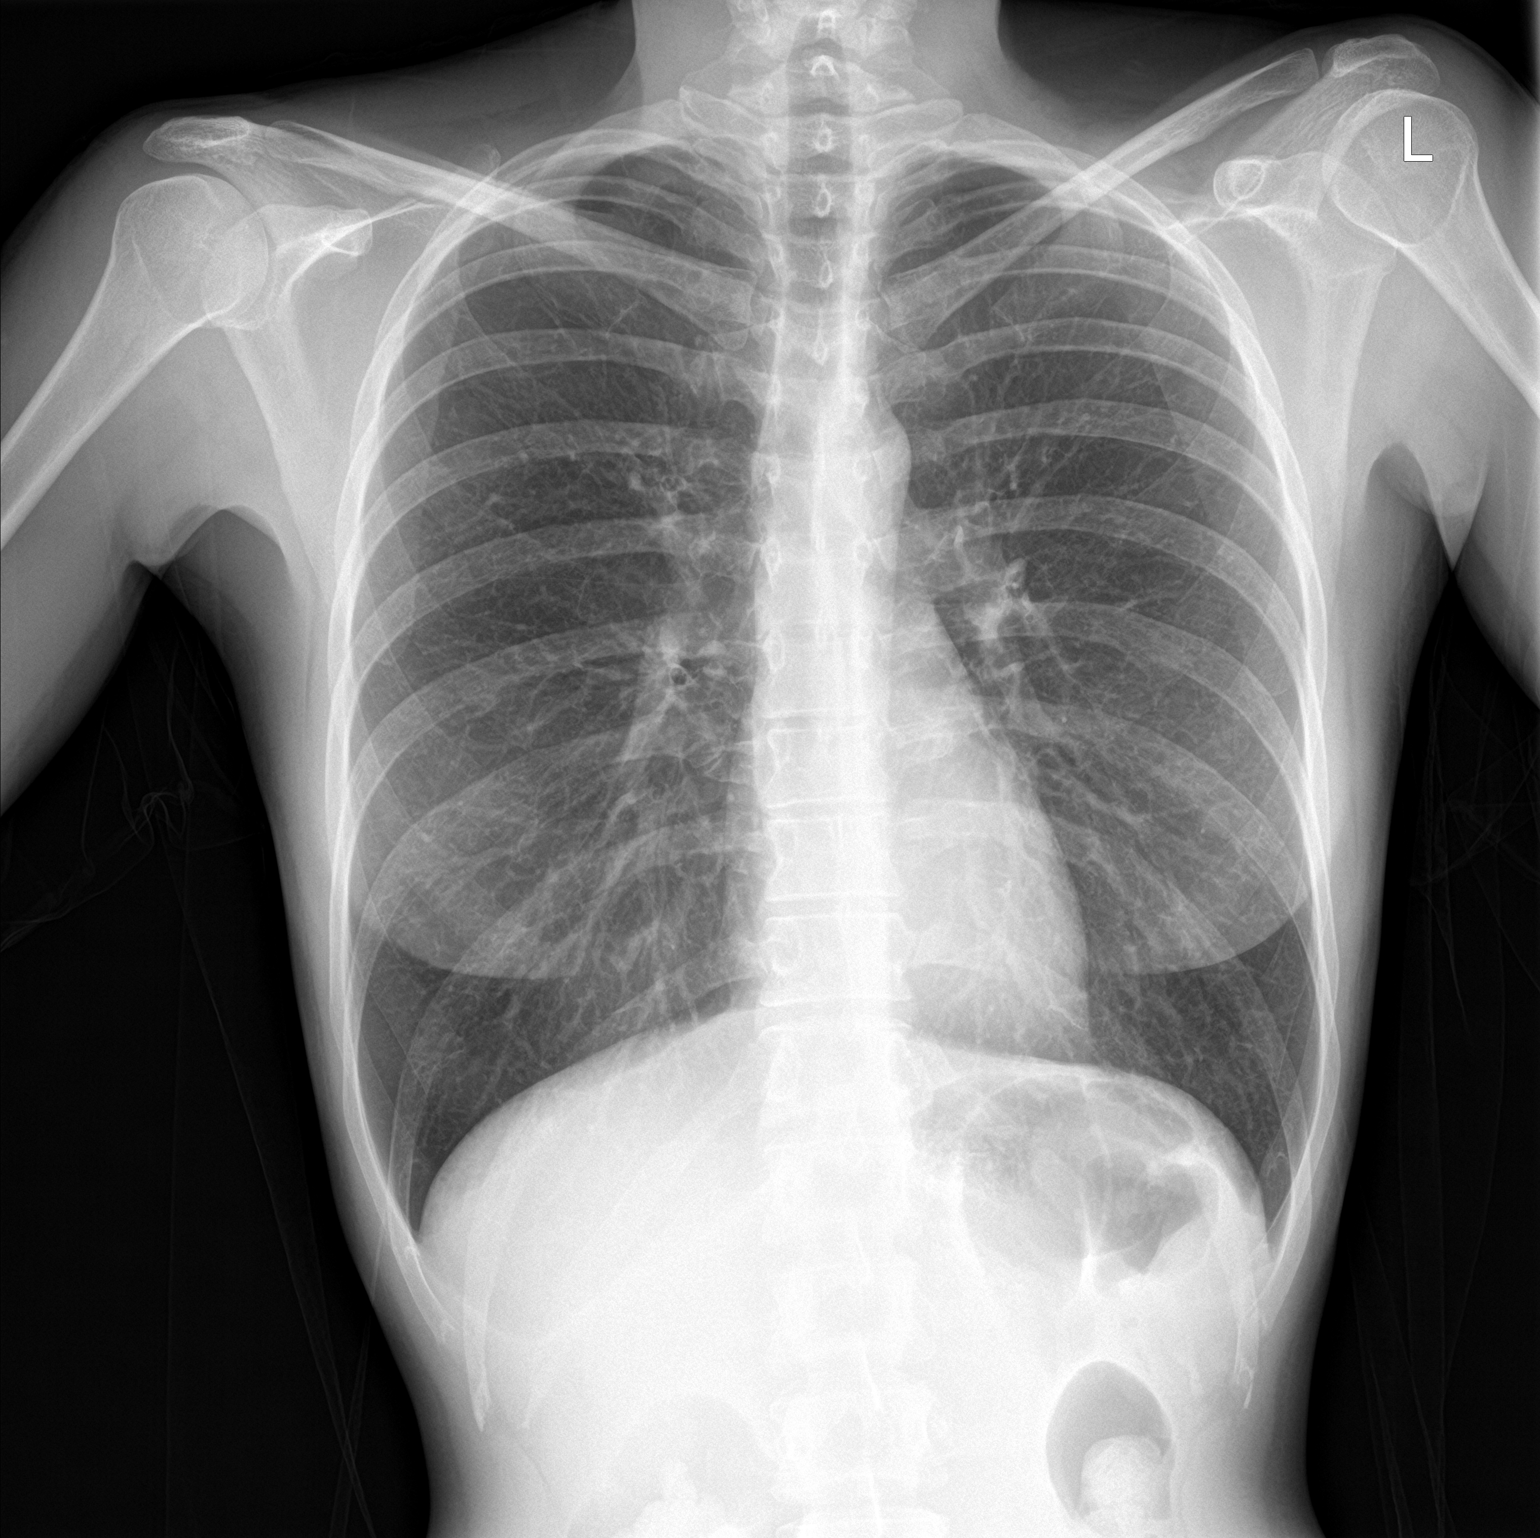

[rib pa]
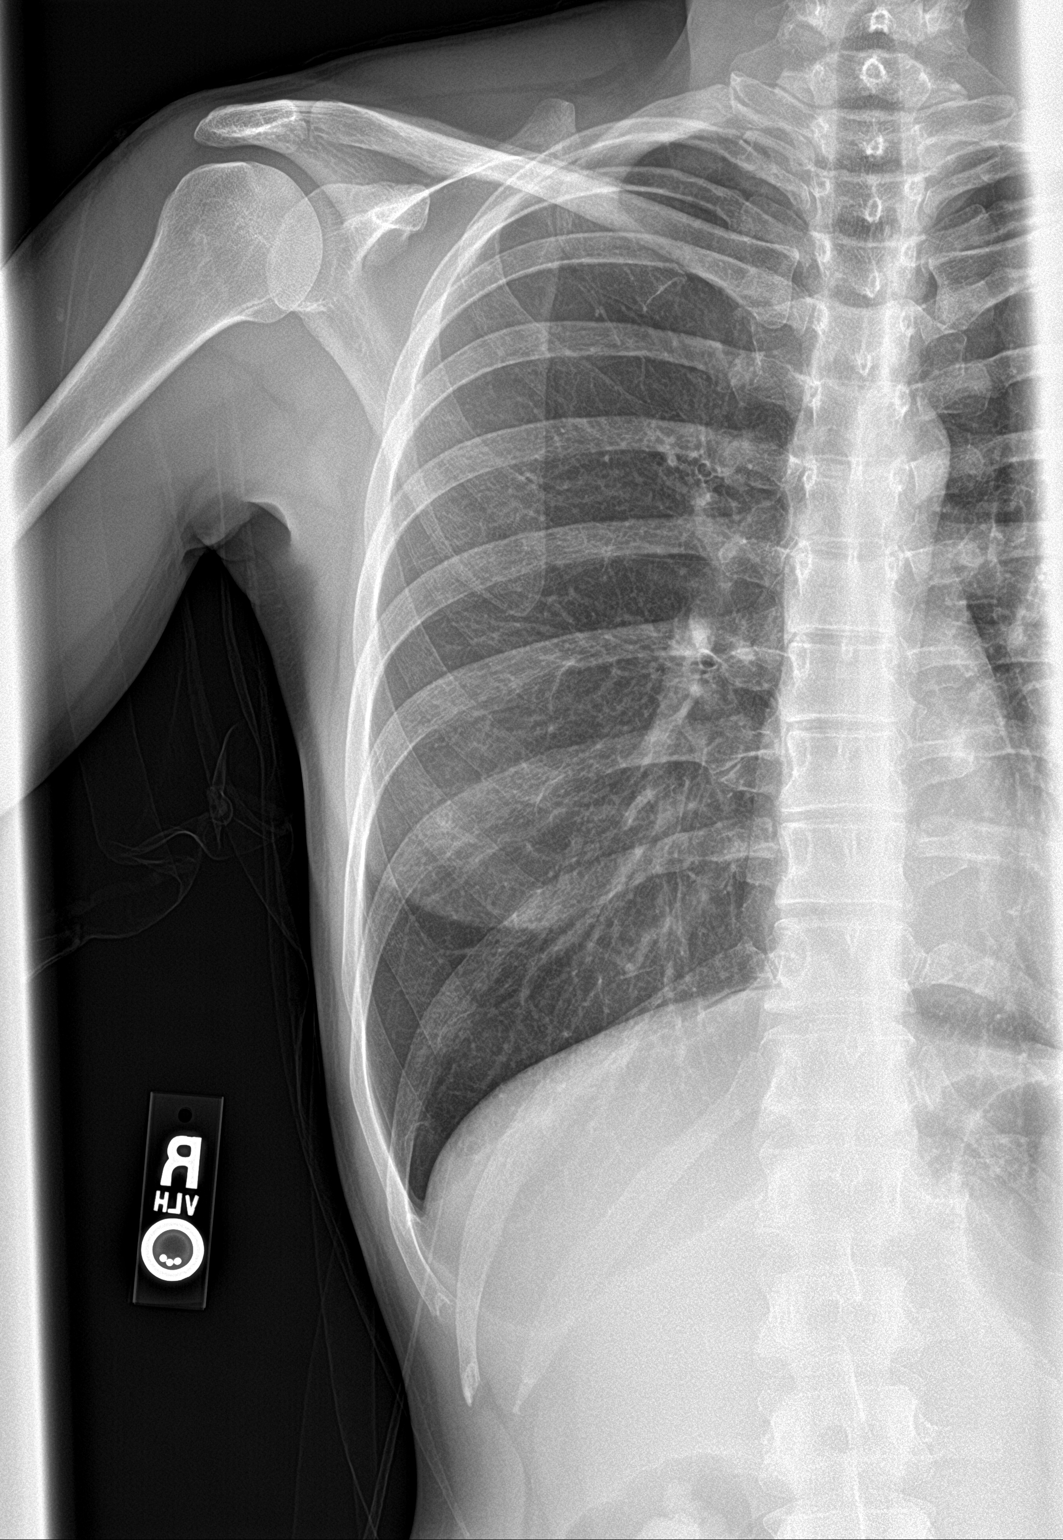

[rib pa obl]
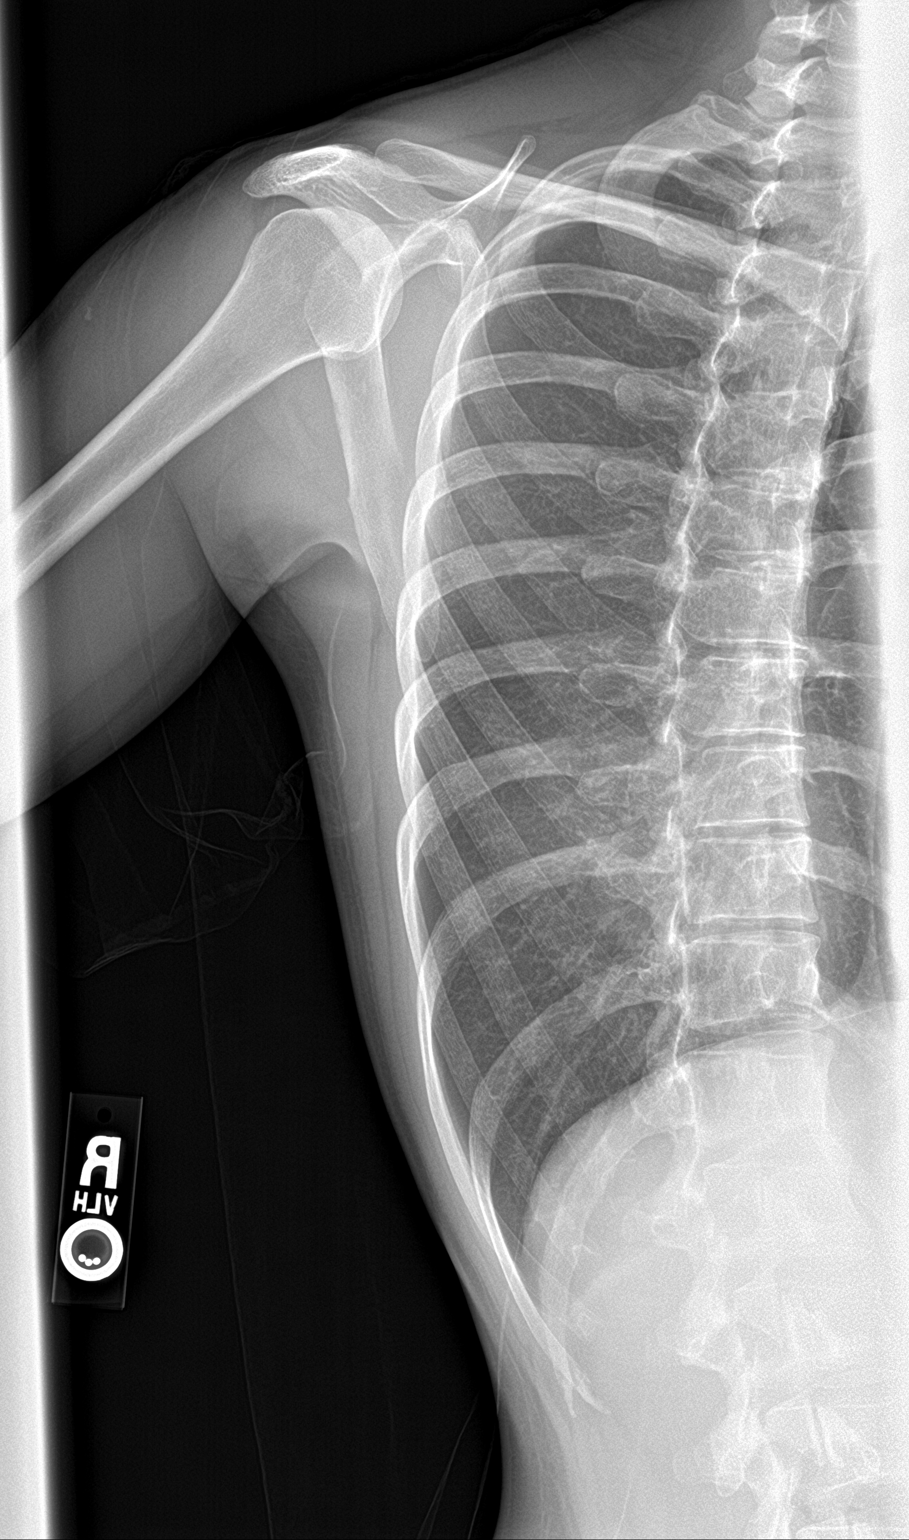

[3 of 3 positions shown; findings below may reference images not displayed]

FINDINGS: No fracture or other bone lesions are seen involving the ribs. There
is no evidence of pneumothorax or pleural effusion. Both lungs are
clear. Heart size and mediastinal contours are within normal limits.
IMPRESSION: Negative.

## 2022-07-05 ENCOUNTER — Ambulatory Visit (HOSPITAL_COMMUNITY): Payer: Self-pay

## 2022-07-07 ENCOUNTER — Encounter (HOSPITAL_COMMUNITY): Payer: Self-pay

## 2022-07-07 ENCOUNTER — Other Ambulatory Visit: Payer: Self-pay

## 2022-07-07 ENCOUNTER — Emergency Department (HOSPITAL_COMMUNITY): Payer: Medicaid Other

## 2022-07-07 ENCOUNTER — Emergency Department (HOSPITAL_COMMUNITY)
Admission: EM | Admit: 2022-07-07 | Discharge: 2022-07-08 | Disposition: A | Discharge status: Home / Self Care | Payer: Medicaid Other | Attending: Emergency Medicine | Admitting: Emergency Medicine

## 2022-07-07 ENCOUNTER — Encounter (HOSPITAL_COMMUNITY): Payer: Self-pay | Admitting: Emergency Medicine

## 2022-07-07 ENCOUNTER — Ambulatory Visit (HOSPITAL_COMMUNITY)
Admission: RE | Admit: 2022-07-07 | Discharge: 2022-07-07 | Disposition: A | Discharge status: Home / Self Care | Payer: Commercial Managed Care - HMO | Source: Ambulatory Visit | Attending: Emergency Medicine | Admitting: Emergency Medicine

## 2022-07-07 ENCOUNTER — Ambulatory Visit (INDEPENDENT_AMBULATORY_CARE_PROVIDER_SITE_OTHER): Payer: Commercial Managed Care - HMO

## 2022-07-07 ENCOUNTER — Other Ambulatory Visit (HOSPITAL_COMMUNITY): Payer: Commercial Managed Care - HMO

## 2022-07-07 VITALS — BP 145/84 | HR 110 | Temp 98.6°F | Resp 16 | Ht 60.0 in

## 2022-07-07 DIAGNOSIS — E079 Disorder of thyroid, unspecified: Secondary | ICD-10-CM | POA: Diagnosis not present

## 2022-07-07 DIAGNOSIS — R059 Cough, unspecified: Secondary | ICD-10-CM | POA: Diagnosis present

## 2022-07-07 DIAGNOSIS — N926 Irregular menstruation, unspecified: Secondary | ICD-10-CM | POA: Diagnosis not present

## 2022-07-07 DIAGNOSIS — R1900 Intra-abdominal and pelvic swelling, mass and lump, unspecified site: Secondary | ICD-10-CM | POA: Insufficient documentation

## 2022-07-07 DIAGNOSIS — R531 Weakness: Secondary | ICD-10-CM | POA: Diagnosis not present

## 2022-07-07 DIAGNOSIS — Z20822 Contact with and (suspected) exposure to covid-19: Secondary | ICD-10-CM | POA: Insufficient documentation

## 2022-07-07 DIAGNOSIS — R911 Solitary pulmonary nodule: Secondary | ICD-10-CM | POA: Diagnosis not present

## 2022-07-07 DIAGNOSIS — R918 Other nonspecific abnormal finding of lung field: Secondary | ICD-10-CM

## 2022-07-07 LAB — CBC WITH DIFFERENTIAL/PLATELET
Abs Immature Granulocytes: 0.07 10*3/uL (ref 0.00–0.07)
Basophils Absolute: 0.1 10*3/uL (ref 0.0–0.1)
Basophils Relative: 0 %
Eosinophils Absolute: 0 10*3/uL (ref 0.0–0.5)
Eosinophils Relative: 0 %
HCT: 40.8 % (ref 36.0–46.0)
Hemoglobin: 13.3 g/dL (ref 12.0–15.0)
Immature Granulocytes: 0 %
Lymphocytes Relative: 16 %
Lymphs Abs: 2.5 10*3/uL (ref 0.7–4.0)
MCH: 29.2 pg (ref 26.0–34.0)
MCHC: 32.6 g/dL (ref 30.0–36.0)
MCV: 89.5 fL (ref 80.0–100.0)
Monocytes Absolute: 0.7 10*3/uL (ref 0.1–1.0)
Monocytes Relative: 4 %
Neutro Abs: 12.3 10*3/uL — ABNORMAL HIGH (ref 1.7–7.7)
Neutrophils Relative %: 80 %
Platelets: 493 10*3/uL — ABNORMAL HIGH (ref 150–400)
RBC: 4.56 MIL/uL (ref 3.87–5.11)
RDW: 13 % (ref 11.5–15.5)
WBC: 15.7 10*3/uL — ABNORMAL HIGH (ref 4.0–10.5)
nRBC: 0 % (ref 0.0–0.2)

## 2022-07-07 LAB — RESP PANEL BY RT-PCR (RSV, FLU A&B, COVID)  RVPGX2
Influenza A by PCR: NEGATIVE
Influenza B by PCR: NEGATIVE
Resp Syncytial Virus by PCR: NEGATIVE
SARS Coronavirus 2 by RT PCR: NEGATIVE

## 2022-07-07 LAB — POCT URINALYSIS DIPSTICK, ED / UC
Bilirubin Urine: NEGATIVE
Glucose, UA: NEGATIVE mg/dL
Hgb urine dipstick: NEGATIVE
Ketones, ur: NEGATIVE mg/dL
Leukocytes,Ua: NEGATIVE
Nitrite: NEGATIVE
Protein, ur: NEGATIVE mg/dL
Specific Gravity, Urine: 1.01 (ref 1.005–1.030)
Urobilinogen, UA: 0.2 mg/dL (ref 0.0–1.0)
pH: 7 (ref 5.0–8.0)

## 2022-07-07 LAB — COMPREHENSIVE METABOLIC PANEL
ALT: 12 U/L (ref 0–44)
AST: 25 U/L (ref 15–41)
Albumin: 3.4 g/dL — ABNORMAL LOW (ref 3.5–5.0)
Alkaline Phosphatase: 83 U/L (ref 38–126)
Anion gap: 13 (ref 5–15)
BUN: 7 mg/dL (ref 6–20)
CO2: 22 mmol/L (ref 22–32)
Calcium: 9.7 mg/dL (ref 8.9–10.3)
Chloride: 100 mmol/L (ref 98–111)
Creatinine, Ser: 0.54 mg/dL (ref 0.44–1.00)
GFR, Estimated: >60 mL/min (ref >60)
Glucose, Bld: 141 mg/dL — ABNORMAL HIGH (ref 70–99)
Potassium: 4.6 mmol/L (ref 3.5–5.1)
Sodium: 135 mmol/L (ref 135–145)
Total Bilirubin: 0.4 mg/dL (ref 0.3–1.2)
Total Protein: 8.3 g/dL — ABNORMAL HIGH (ref 6.5–8.1)

## 2022-07-07 LAB — POC URINE PREG, ED: Preg Test, Ur: NEGATIVE

## 2022-07-07 LAB — CBG MONITORING, ED: Glucose-Capillary: 124 mg/dL — ABNORMAL HIGH (ref 70–99)

## 2022-07-07 MED ORDER — IOHEXOL 350 MG/ML SOLN
75.0000 mL | Freq: Once | INTRAVENOUS | Status: AC | PRN
  Administered 2022-07-07: 75 mL via INTRAVENOUS

## 2022-07-07 NOTE — ED Provider Notes (Signed)
[facility]    CSN: [id number] Arrival date & time: 07/07/22  1146     History   Chief Complaint Chief Complaint  Patient presents with   Generalized Body Aches   Headache   Late Period   Loss of Consciousness    3 weeks ago   Mass    Right side neck     HPI Natalie Hinton is a 37 y.o. female.  Here with multiple concerns Reports missed menstrual cycles, last was in October Usually has regular cycles. Does not have an ob/gyn  3 weeks ago she felt weak and fainted. Did not hit her head Has been feeling off, clammy and weak  Reports a lump on her neck for about 2-3 weeks Painful. Sometimes feels it with swallowing. No trouble breathing. No history of thyroid issues  1 month history of productive cough. Improving with nyquil. Denies fever. No congestion or sore throat. Denies vomiting/diarrhea. Current smoker  Additionally 3 weeks of burning with urination Feels dehydrated, has been trying to increase fluids No hematuria. Denies flank or abd pain  Took Asprin and tylenol with some relief  History reviewed. No pertinent past medical history.  There are no problems to display for this patient.   History reviewed. No pertinent surgical history.  OB History   No obstetric history on file.      Home Medications    Prior to Admission medications   Medication Sig Start Date End Date Taking? Authorizing Provider  naproxen (NAPROSYN) 500 MG tablet Take 1 tablet (500 mg total) by mouth 2 (two) times daily. 01/16/20   Delia Heady, PA-C    Family History History reviewed. No pertinent family history.  Social History Social History   Tobacco Use   Smoking status: Every Day    Types: Cigarettes   Smokeless tobacco: Never  Vaping Use   Vaping Use: Never used  Substance Use Topics   Alcohol use: Never   Drug use: Yes    Types: Marijuana     Allergies   Penicillins   Review of Systems Review of Systems As per HPI  Physical Exam Triage  Vital Signs ED Triage Vitals  Enc Vitals Group     BP 07/07/22 1214 (!) 145/84     Pulse Rate 07/07/22 1214 (!) 110     Resp 07/07/22 1214 16     Temp 07/07/22 1214 98.6 F (37 C)     Temp Source 07/07/22 1214 Oral     SpO2 07/07/22 1214 97 %     Weight --      Height 07/07/22 1215 5' (1.524 m)     Head Circumference --      Peak Flow --      Pain Score 07/07/22 1212 10     Pain Loc --      Pain Edu? --      Excl. in [place]? --    No data found.  Updated Vital Signs BP (!) 145/84 (BP Location: Left Arm)   Pulse (!) 110   Temp 98.6 F (37 C) (Oral)   Resp 16   Ht 5' (1.524 m)   LMP 04/04/2022 (Approximate)   SpO2 97%   BMI 21.48 kg/m    Physical Exam Vitals and nursing note reviewed.  Constitutional:      General: She is not in acute distress.    Appearance: She is ill-appearing (chronically).     Comments: Cachectic   HENT:     Mouth/Throat:  Mouth: Mucous membranes are moist.     Pharynx: Oropharynx is clear.  Neck:     Thyroid: Thyroid mass and thyroid tenderness present.     Trachea: Trachea and phonation normal.      Comments: Right side mass, tender Cardiovascular:     Rate and Rhythm: Normal rate and regular rhythm.     Heart sounds: Normal heart sounds.  Pulmonary:     Effort: Pulmonary effort is normal.     Breath sounds: Normal breath sounds.     Comments: Clear lungs throughout with normal WOB Abdominal:     Tenderness: There is no abdominal tenderness.  Lymphadenopathy:     Cervical: No cervical adenopathy.  Skin:    General: Skin is warm and dry.  Neurological:     Mental Status: She is alert and oriented to person, place, and time.     UC Treatments / Results  Labs (all labs ordered are listed, but only abnormal results are displayed) Labs Reviewed  CBG MONITORING, ED - Abnormal; Notable for the following components:      Result Value   Glucose-Capillary 124 (*)    All other components within normal limits  POC URINE PREG, ED   POCT URINALYSIS DIPSTICK, ED / UC    EKG  Radiology DG Chest 2 View  Result Date: 07/07/2022 CLINICAL DATA:  One month of cough. EXAM: CHEST - 2 VIEW COMPARISON:  Chest radiograph February 09, 2020. FINDINGS: The heart size and mediastinal contours are within normal limits. Innumerable rounded solid bilateral lower lobe predominant pulmonary nodules. No visible pleural effusion or pneumothorax. The visualized skeletal structures are unremarkable. IMPRESSION: Innumerable rounded solid bilateral, lower lobe predominant pulmonary nodules, highly suspicious for metastatic disease. Recommend further evaluation with chest CT. These results will be called to the ordering clinician or representative by the Radiologist Assistant, and communication documented in the PACS or [employer]. Electronically Signed   By: Dahlia Bailiff M.D.   On: 07/07/2022 13:17    Procedures Procedures   Medications Ordered in UC Medications - No data to display  Initial Impression / Assessment and Plan / UC Course  I have reviewed the triage vital signs and the nursing notes.  Pertinent labs & imaging results that were available during my care of the patient were reviewed by me and considered in my medical decision making (see chart for details).  Concern for thyroid mass.  UA unremarkable UPT negative. CBG 124  CXR obtained due to 1 month history of cough. Results with multiple pulmonary nodules, radiology with high concern for metastatic disease. This provider discussed findings with patient. Patient was accepting of information and reports she felt something was wrong. Patient sent to the emergency department for CT and further evaluation.   We did have a chance to schedule her with a PCP, will be available to her for referrals and follow ups.  Final Clinical Impressions(s) / UC Diagnoses   Final diagnoses:  Missed periods  Thyroid mass  Abnormal chest x-ray with multiple lung nodules      Discharge Instructions      Please go to the emergency department for further evaluation.      ED Prescriptions   None    PDMP not reviewed this encounter.   Makinlee Awwad, Wells Guiles, [place] 07/07/22 1405

## 2022-07-07 NOTE — Discharge Instructions (Addendum)
Please go to the emergency department for further evaluation.

## 2022-07-07 NOTE — ED Triage Notes (Addendum)
Chief Complaint: back pain, knee pain, shoulder pain, headache. Late period normally regular. LMP was in October.  Patient having bad body aches. Patient has a productive cough with yellow mucus. No fever, runny nose, or sore throat. Patient states within the last month she fainted twice and fell. States she got weak and clammy. Patient has a lump/mass on the right side of the neck after falling 3 weeks ago. Area is raised and painful.   Onset: 1 month   Prescriptions or OTC medications tried: Yes- aspirin, otc pain relievers, tylenol    with mild relief  Sick exposure: No  New foods, medications, or products: No  Recent Travel: No

## 2022-07-07 NOTE — ED Triage Notes (Signed)
Pt reports she was sent from urgent care for cough and abnormal chest x-ray. PT also reporting emesis, back pain, and dysuria.

## 2022-07-07 NOTE — ED Notes (Signed)
Patient is being discharged from the Urgent Care and sent to the Emergency Department via personal vehicle. Per [facility] PA, patient is in need of higher level of care due to abnormal findings on chest x-ray needing further STAT imaging. Patient is aware and verbalizes understanding of plan of care.  Vitals:   07/07/22 1214  BP: (!) 145/84  Pulse: (!) 110  Resp: 16  Temp: 98.6 F (37 C)  SpO2: 97%

## 2022-07-07 NOTE — ED Provider Triage Note (Signed)
Emergency Medicine Provider Triage Evaluation Note  Natalie Hinton , a 37 y.o. female  was evaluated in triage.  Pt complains of cough, back pain, dysuria.  Patient presents from urgent care after abnormal chest x-ray was performed which showed recommended follow-up with CT chest for concern of possible metastatic lesions.  Patient reports weight loss, night sweats, diffuse body pain, bone pain..  Review of Systems  Positive: As above Negative: As above  Physical Exam  BP (!) 150/100 (BP Location: Right Arm)   Pulse 99   Temp 98.1 F (36.7 C) (Oral)   Resp 18   LMP 04/04/2022 (Approximate)   SpO2 99%  Gen:   Awake, no distress Resp:  Normal effort  MSK:   Moves extremities without difficulty  Other:    Medical Decision Making  Medically screening exam initiated at 3:18 PM.  Appropriate orders placed.  Devorah M Lockner was informed that the remainder of the evaluation will be completed by another provider, this initial triage assessment does not replace that evaluation, and the importance of remaining in the ED until their evaluation is complete.     Luvenia Heller, PA-C 07/07/22 1519

## 2022-07-08 DIAGNOSIS — R911 Solitary pulmonary nodule: Secondary | ICD-10-CM | POA: Diagnosis not present

## 2022-07-08 MED ORDER — FENTANYL CITRATE PF 50 MCG/ML IJ SOSY
50.0000 ug | PREFILLED_SYRINGE | Freq: Once | INTRAMUSCULAR | Status: AC
  Administered 2022-07-08: 50 ug via INTRAVENOUS
  Filled 2022-07-08: qty 1

## 2022-07-08 NOTE — Discharge Instructions (Signed)
I have contacted oncology-- they should be arranging follow-up for you. If you do not hear from them in the next day or two, please call and follow-up on appt. Return here for any new/acute changes.

## 2022-07-08 NOTE — ED Provider Notes (Signed)
[facility] EMERGENCY DEPARTMENT Provider Note   CSN: [id number] Arrival date & time: 07/07/22  1348     History  Chief Complaint  Patient presents with   Cough    Natalie Hinton is a 37 y.o. female.  The history is provided by the patient and medical records.  Cough  37 year old female with no significant past medical history presenting to the ED with cough.  She reports cough over the past month, has been intermittently productive with yellow phlegm.  She denies any hemoptysis.  She has not had any fever but states she has been waking up a lot sweaty in the morning.  She denies any known sick contacts, no COVID or flu exposures.  Additionally, patient has swelling in the right side of her neck and near her right clavicle.  Also has pain in her left hip and in the left groin.  This has been going on for a few weeks now.  She has been taking Tylenol and Motrin without relief.  Initially had attributed this to a fall but symptoms seem to be worsening.  She has not had any focal numbness or weakness of the legs.  No bowel or bladder incontinence.  Patient does admit to approximately 25 pound weight loss over the past month or so.  She has been eating regularly.  She is a smoker, up to 1PPD if stressed.  Has been smoking this way since she was a teenager.  Family history of lung cancer-- grandmother died in 66's (lifelong smoker).  Home Medications Prior to Admission medications   Medication Sig Start Date End Date Taking? Authorizing Provider  naproxen (NAPROSYN) 500 MG tablet Take 1 tablet (500 mg total) by mouth 2 (two) times daily. 01/16/20   Khatri, Hina, PA-C      Allergies    Penicillins    Review of Systems   Review of Systems  Constitutional:  Positive for unexpected weight change.  Respiratory:  Positive for cough.   Musculoskeletal:  Positive for arthralgias.  All other systems reviewed and are negative.   Physical Exam Updated Vital Signs BP (!)  143/98   Pulse (!) 118   Temp 98.9 F (37.2 C)   Resp 18   LMP 04/04/2022 (Approximate)   SpO2 100%   Physical Exam Vitals and nursing note reviewed.  Constitutional:      Appearance: She is well-developed.     Comments: thin  HENT:     Head: Normocephalic and atraumatic.     Mouth/Throat:     Comments: No dentition Eyes:     Conjunctiva/sclera: Conjunctivae normal.     Pupils: Pupils are equal, round, and reactive to light.  Cardiovascular:     Rate and Rhythm: Normal rate and regular rhythm.     Heart sounds: Normal heart sounds.  Pulmonary:     Effort: Pulmonary effort is normal.     Breath sounds: Normal breath sounds.  Abdominal:     General: Bowel sounds are normal.     Palpations: Abdomen is soft.  Musculoskeletal:        General: Normal range of motion.     Cervical back: Normal range of motion.  Lymphadenopathy:     Comments: Fairly diffuse lymphadenopathy, right neck, right subclavicular, bilateral groin  Skin:    General: Skin is warm and dry.  Neurological:     Mental Status: She is alert and oriented to person, place, and time.     ED Results / Procedures /  Treatments   Labs (all labs ordered are listed, but only abnormal results are displayed) Labs Reviewed  COMPREHENSIVE METABOLIC PANEL - Abnormal; Notable for the following components:      Result Value   Glucose, Bld 141 (*)    Total Protein 8.3 (*)    Albumin 3.4 (*)    All other components within normal limits  CBC WITH DIFFERENTIAL/PLATELET - Abnormal; Notable for the following components:   WBC 15.7 (*)    Platelets 493 (*)    Neutro Abs 12.3 (*)    All other components within normal limits  RESP PANEL BY RT-PCR (RSV, FLU A&B, COVID)  [id number]    EKG None  Radiology CT CHEST ABDOMEN PELVIS W CONTRAST  Result Date: 07/07/2022 CLINICAL DATA:  Metastatic disease evaluation, occult malignancy. Back pain, knee pain, shoulder pain and headache. This area. Patient reports weight loss, night  sweats, body pain and bone pain. EXAM: CT CHEST, ABDOMEN, AND PELVIS WITH CONTRAST TECHNIQUE: Multidetector CT imaging of the chest, abdomen and pelvis was performed following the standard protocol during bolus administration of intravenous contrast. RADIATION DOSE REDUCTION: This exam was performed according to the departmental dose-optimization program which includes automated exposure control, adjustment of the mA and/or kV according to patient size and/or use of iterative reconstruction technique. CONTRAST:  75m OMNIPAQUE IOHEXOL 350 MG/ML SOLN COMPARISON:  Noncontrast chest CT earlier today. FINDINGS: CT CHEST FINDINGS Cardiovascular: Normal caliber thoracic aorta. No central pulmonary embolus on this exam not tailored to pulmonary artery assessment. No pericardial effusion or thickening. Mediastinum/Nodes: 2.6 cm lesion with central hypodensity in the anterior right paratracheal station is likely necrotic node series 3, image 19. Presumed necrotic right thoracic inlet node measuring 2.6 cm series 3, image 7. there additional necrotic nodes in the highest mediastinum. Ill-defined right hilar soft tissue density likely represents adenopathy, series 3, image 26. There is also a 10 mm right hilar node series 3, image 28. No esophageal wall thickening. No discrete thyroid nodule. No axillary adenopathy. Lungs/Pleura: Mild apical emphysema or blebs. Numeral wall bilateral pulmonary nodules within both lungs typical of metastatic disease. Index lesion in the right lower lobe measures 19 x 16 mm, series 5, image 128. No pleural effusion or significant pleural thickening. Minimal retained mucus within the central bronchi. There is slight narrowing of the right upper lobe bronchus due to mediastinal adenopathy. Mild right lower lobe bronchiectasis with occasional mucoid impaction. Musculoskeletal: No obvious destructive lytic or blastic osseous lesion. No dominant breast mass. Generalized paucity of body fat. CT  ABDOMEN PELVIS FINDINGS Hepatobiliary: Water density 12 mm lesion in the right hepatic dome series 3, image 51, typically cyst. No evidence of enhancing liver lesion. Gallbladder physiologically distended, no calcified stone. No biliary dilatation. Pancreas: Suboptimally assessed due to lack of enteric contrast and paucity of intra-abdominal fat. There is no obvious pancreatic mass. No ductal dilatation or inflammation. Spleen: Normal in size. Punctate calcified granuloma. No splenic lesion. Adrenals/Urinary Tract: No adrenal nodule. Dilatation of both renal pelvis, left greater than right. No perinephric edema. Intermittent ureteral dilatation. The urinary bladder is slightly displaced into the left pelvis by pelvic mass. No bladder wall thickening. No suspicious renal abnormality. No definite renal calculi, although excreted contrast in the renal collecting systems obscured assessment. Stomach/Bowel: Bowel assessment is limited in the absence of enteric contrast and paucity of intra-abdominal fat. Fluid/ingested material within the stomach. No bowel dilatation or evidence of obstruction moderate volume of colonic stool within the ascending, transverse and descending colon.  Sigmoid colon is mildly redundant and displaced by pelvic mass. Vascular/Lymphatic: Patent portal, splenic and mesenteric veins. Normal caliber abdominal aorta. Limited assessment for adenopathy in the absence of enteric contrast and paucity of intra-abdominal fat. There is no convincing enlarged lymph nodes in the abdomen or pelvis. Reproductive: Large midline cystic pelvic mass measures 14 x 10.8 x 11.3 cm, series 3, image 100. Extending superiorly from this on the right is a dilated fluid-filled tubular structure that may represent hydrosalpinx query separate cystic component. This may arise from the right ovary which is not discretely visualized. No convincing solid components. The left ovary is also not discretely visualized. The uterus is  deviated into the right pelvis. Posterior to the uterus in the posterior pelvic midline the heterogeneous lobulated heterogeneously enhancing lesion measuring 5.6 x 4.9 cm, series 3, image 104, possible connection to the lower aspect of the uterus but not definite. There may be heterogeneity of the lower cervix and vagina. Other: Suspect minimal free fluid in the right pelvis. No upper abdominal ascites or free fluid. No convincing omental thickening, although paucity of body fat limits assessment. Musculoskeletal: Destructive lesion involving the left superior pubic ramus approaches the pubic body with cortical destruction, series 3, image 116. there is an extraosseous soft tissue component extending into the adjacent obturator musculature. No definite additional bone lesion. IMPRESSION: 1. Findings typical of metastatic disease in the thorax with innumerable pulmonary nodules, necrotic mediastinal and right thoracic inlet adenopathy. 2. Cystic mass in the pelvic midline, given size, suspicious for neoplasm. There is also heterogeneous lesion posterior to the uterus, unclear if this represents a primary lesion, metastatic deposit or arises from the uterus has a pedunculated lesion. 3. Destructive bone lesion in the left superior pubic ramus typical of metastatic disease. No definite additional osseous metastatic disease seen by CT, however PET scan or bone scan may be helpful for a complete osseous assessment. 4. Site of primary neoplasm is not confidently confirmed, although given size of cystic pelvic mass and adjacent enhancing structure, GYN neoplasm is considered. Recommend oncologic referral for consideration of multi disciplinary assessment. Tissue sampling may be helpful. The right thoracic inlet node may be an accessible site for biopsy. Electronically Signed   By: Keith Rake M.D.   On: 07/07/2022 18:27   CT Soft Tissue Neck W Contrast  Result Date: 07/07/2022 CLINICAL DATA:  Metastatic disease  evaluation; multiple lung nodules EXAM: CT HEAD WITHOUT AND WITH CONTRAST CT NECK WITH CONTRAST TECHNIQUE: Contiguous axial images were obtained from the base of the skull through the vertex without and with intravenous contrast. Multidetector CT imaging of the and neck was performed using the standard protocol following the bolus administration of intravenous contrast. RADIATION DOSE REDUCTION: This exam was performed according to the departmental dose-optimization program which includes automated exposure control, adjustment of the mA and/or kV according to patient size and/or use of iterative reconstruction technique. CONTRAST:  87m OMNIPAQUE IOHEXOL 350 MG/ML SOLN COMPARISON:  CT chest 07/07/2022; no prior head or neck CT FINDINGS: CT HEAD FINDINGS Brain: No evidence of acute infarct, hemorrhage, mass, mass effect, or midline shift. No hydrocephalus or extra-axial fluid collection. No abnormal enhancement. Vascular: No hyperdense vessel. Normal arterial and venous enhancement. Skull: Normal. Negative for fracture or focal lesion. Sinuses/Orbits: Mucosal thickening in the right sphenoid sinus. Otherwise clear paranasal sinuses. No acute finding in the orbits. Other: The mastoid air cells are well aerated. CT NECK FINDINGS Pharynx and larynx: Normal. No mass or swelling. Salivary glands: No  inflammation, mass, or stone. Thyroid: Hypoenhancing nodule in the right thyroid lobe, which measures up to 7 mm (series 9, image 76), for which no follow-up is currently indicated. (Reference: J Am Coll Radiol. 2015 Feb;12(2): 143-50) Lymph nodes: Centrally necrotic right level 4 lymph node measures 2.8 x 2.5 x 3.6 cm (AP x TR x CC) (series 9, image 79 and series 8, image 62). Prominent adjacent right level 4 lymph node (series 9, image 73), which is not significantly enlarged or centrally necrotic. No other abnormal cervical lymph nodes. Please see same-day CT chest report for subclavicular lymph nodes. Vascular: Patent.  Skeleton: No acute osseous abnormality. Edentulous. Upper chest: Please see same-day CT chest Other: Somewhat asymmetric enlargement of the right sternocleidomastoid muscle, without focal abnormal enhancement or collection, which may be reactive to the aforementioned right level 4 necrotic lymph node. IMPRESSION: 1. Centrally necrotic right level 4 lymph node, consistent with metastatic disease. Please see same-day CT chest report for additional subclavicular lymph nodes. 2. No acute intracranial process. 3. Somewhat asymmetric enlargement of the right sternocleidomastoid muscle, without focal abnormal enhancement or collection, which may be reactive. 4. No evidence of metastatic disease in the brain or skull base. 5. No additional acute process in the neck. Electronically Signed   By: Merilyn Baba M.D.   On: 07/07/2022 18:14   CT Head W or Wo Contrast  Result Date: 07/07/2022 CLINICAL DATA:  Metastatic disease evaluation; multiple lung nodules EXAM: CT HEAD WITHOUT AND WITH CONTRAST CT NECK WITH CONTRAST TECHNIQUE: Contiguous axial images were obtained from the base of the skull through the vertex without and with intravenous contrast. Multidetector CT imaging of the and neck was performed using the standard protocol following the bolus administration of intravenous contrast. RADIATION DOSE REDUCTION: This exam was performed according to the departmental dose-optimization program which includes automated exposure control, adjustment of the mA and/or kV according to patient size and/or use of iterative reconstruction technique. CONTRAST:  55m OMNIPAQUE IOHEXOL 350 MG/ML SOLN COMPARISON:  CT chest 07/07/2022; no prior head or neck CT FINDINGS: CT HEAD FINDINGS Brain: No evidence of acute infarct, hemorrhage, mass, mass effect, or midline shift. No hydrocephalus or extra-axial fluid collection. No abnormal enhancement. Vascular: No hyperdense vessel. Normal arterial and venous enhancement. Skull: Normal. Negative  for fracture or focal lesion. Sinuses/Orbits: Mucosal thickening in the right sphenoid sinus. Otherwise clear paranasal sinuses. No acute finding in the orbits. Other: The mastoid air cells are well aerated. CT NECK FINDINGS Pharynx and larynx: Normal. No mass or swelling. Salivary glands: No inflammation, mass, or stone. Thyroid: Hypoenhancing nodule in the right thyroid lobe, which measures up to 7 mm (series 9, image 76), for which no follow-up is currently indicated. (Reference: J Am Coll Radiol. 2015 Feb;12(2): 143-50) Lymph nodes: Centrally necrotic right level 4 lymph node measures 2.8 x 2.5 x 3.6 cm (AP x TR x CC) (series 9, image 79 and series 8, image 62). Prominent adjacent right level 4 lymph node (series 9, image 73), which is not significantly enlarged or centrally necrotic. No other abnormal cervical lymph nodes. Please see same-day CT chest report for subclavicular lymph nodes. Vascular: Patent. Skeleton: No acute osseous abnormality. Edentulous. Upper chest: Please see same-day CT chest Other: Somewhat asymmetric enlargement of the right sternocleidomastoid muscle, without focal abnormal enhancement or collection, which may be reactive to the aforementioned right level 4 necrotic lymph node. IMPRESSION: 1. Centrally necrotic right level 4 lymph node, consistent with metastatic disease. Please see same-day CT chest  report for additional subclavicular lymph nodes. 2. No acute intracranial process. 3. Somewhat asymmetric enlargement of the right sternocleidomastoid muscle, without focal abnormal enhancement or collection, which may be reactive. 4. No evidence of metastatic disease in the brain or skull base. 5. No additional acute process in the neck. Electronically Signed   By: Merilyn Baba M.D.   On: 07/07/2022 18:14   CT CHEST WO CONTRAST  Addendum Date: 07/07/2022   ADDENDUM REPORT: 07/07/2022 16:25 CLINICAL DATA:  Cough, back pain dysuria EXAM: CT CHEST WITHOUT CONTRAST TECHNIQUE:  Multidetector CT imaging of the chest was performed following the standard protocol without IV contrast. RADIATION DOSE REDUCTION: This exam was performed according to the departmental dose-optimization program which includes automated exposure control, adjustment of the mA and/or kV according to patient size and/or use of iterative reconstruction technique. COMPARISON:  Chest x-ray 07/07/2022 FINDINGS: Cardiovascular: Grossly normal caliber thoracic aorta. No vascular calcifications. Heart is not enlarged. Small pericardial effusion. Mediastinum/Nodes: No abnormal lymph nodes present in the axillary region on this noncontrast exam. Evaluation of the hila is limited. There is presumed abnormal lymph nodes right paratracheal. On series 3, image 17 area measures 3.2 by 3.4 cm. Additional presumed lymph node though the thoracic inlet on the right side lateral to the right thyroid lobe measuring 3.2 by 2.6 cm on series 3, image 4. Normal caliber thoracic esophagus. Thyroid gland is unremarkable. Lungs/Pleura: As seen on the x-ray there are numerous lung masses identified. At least 25 in the left lung and close to 30 on the right. These are of varying size. Larger foci seen in the right lower lobe on image 109 of series 6 measures 19 by 18 mm. Larger foci on left seen posterior to the left hilum on image 84 of series 6 measuring 15 by 15 mm. These are diffusely distributed. Are some areas of mild debris along the right main bronchus. No pleural effusion or pneumothorax. Few subpleural blebs at the apices bilaterally. Upper Abdomen: Adrenal glands are grossly preserved. There appears to be some high density material or possible contrast in the colon. Presumed hepatic cyst. With the lung findings unless there are specific history contrast study of the chest abdomen pelvis may be of some benefit. Musculoskeletal: No chest wall mass or suspicious bone lesions identified. With the lung findings of whole-body bone scan may  also be of benefit when clinically appropriate IMPRESSION: Multiple bilateral soft tissue lung nodules, up to 30 on the right and 25 on the left measuring up to 2 cm. Multiple areas of abnormal lymph nodes in the right thoracic inlet, mediastinum. Please correlate for any known history of malignancy or further workup is recommended Case to be discussed with the emergency room physician by myself on the afternoon of 07/07/2022 Case discussed with PA Rica Mote by myself on the afternoon of 07/07/2022 at 1623 pm EST Electronically Signed   By: Jill Side M.D.   On: 07/07/2022 16:25   Result Date: 07/07/2022 CLINICAL DATA:  Cough, back pain dysuria EXAM: CT CHEST WITHOUT CONTRAST TECHNIQUE: Multidetector CT imaging of the chest was performed following the standard protocol without IV contrast. RADIATION DOSE REDUCTION: This exam was performed according to the departmental dose-optimization program which includes automated exposure control, adjustment of the mA and/or kV according to patient size and/or use of iterative reconstruction technique. COMPARISON:  Chest x-ray 07/07/2022 FINDINGS: Cardiovascular: Mostly normal caliber thoracic aorta. No vascular calcifications. Heart is not enlarged. Small pericardial effusion. Mediastinum/Nodes: No abnormal dorsum and present in  the axillary region on this noncontrast exam. Virus hila is limited. There is presumed abnormal lymph nodes right paratracheal. On series 3, image 17 area measures 3.2 by 3.4 cm. Additional presumed lymph node though the thoracic inlet on the right side lateral to the right thyroid lobe measuring 3.2 by 2.6 cm on series 3, image 4. Normal caliber thoracic esophagus. Thyroid gland is unremarkable. Lungs/Pleura: As seen on the x-ray there are numerous lung masses identified. At least 25% in the left lung and close to 30 on the right. These are of varying size. Larger foci seen in the right lower lobe on image 109 of series 6 measures 19 by 18 mm. Larger  foci on left seen posterior to the left hilum on image 84 of series 6 measuring 15 by 15 mm. These are diffusely distributed. Are some areas of mild debris along the right main bronchus. No pleural effusion or pneumothorax. Few subpleural blebs at the apices bilaterally. Upper Abdomen: Adrenal glands are grossly preserved. There appears to be some high density material or possible contrast in the colon. Presumed hepatic cysts. With the lung findings unless there are specific history contrast study of the chest abdomen pelvis may be of some benefit. Musculoskeletal: No chest wall mass or suspicious bone lesions identified. With the lung findings of whole-body bone scan may also be of benefit when clinically appropriate IMPRESSION: Multiple bilateral soft tissue lung nodules, up to 30 on the right and 25 on the left measuring up to 2 cm. Multiple areas of abnormal lymph nodes in the right thoracic inlet, mediastinum. Please correlate for any known history of malignancy or further workup is recommended Case to be discussed with the emergency room physician by myself on the afternoon of 07/07/2022 Electronically Signed: By: Jill Side M.D. On: 07/07/2022 16:18   DG Chest 2 View  Result Date: 07/07/2022 CLINICAL DATA:  One month of cough. EXAM: CHEST - 2 VIEW COMPARISON:  Chest radiograph February 09, 2020. FINDINGS: The heart size and mediastinal contours are within normal limits. Innumerable rounded solid bilateral lower lobe predominant pulmonary nodules. No visible pleural effusion or pneumothorax. The visualized skeletal structures are unremarkable. IMPRESSION: Innumerable rounded solid bilateral, lower lobe predominant pulmonary nodules, highly suspicious for metastatic disease. Recommend further evaluation with chest CT. These results will be called to the ordering clinician or representative by the Radiologist Assistant, and communication documented in the PACS or [employer]. Electronically Signed   By:  Dahlia Bailiff M.D.   On: 07/07/2022 13:17    Procedures Procedures    Medications Ordered in ED Medications  iohexol (OMNIPAQUE) 350 MG/ML injection 75 mL (75 mLs Intravenous Contrast Given 07/07/22 1751)  fentaNYL (SUBLIMAZE) injection 50 mcg (50 mcg Intravenous Given 07/08/22 0052)    ED Course/ Medical Decision Making/ A&P                           Medical Decision Making Amount and/or Complexity of Data Reviewed Radiology: ordered and independent interpretation performed. ECG/medicine tests: ordered and independent interpretation performed.  Risk Prescription drug management.   37 year old female presenting to the ED with 1 month of cough, hip pain, pelvic pain, and lymphadenopathy.  Seen in urgent care earlier today and transferred here due to innumerable pulmonary nodules.  She is a daily smoker, up to 1 pack/day since she was a teenager.  Does have family history of lung cancer.  She is afebrile, nontoxic.  She is thin in  appearance but not cachectic.  She does have fairly diffuse lymphadenopathy including right neck, right subclavicular area, bilateral groin.  CTs unfortunately with signs of malignancy--pulmonary nodules, pelvic mass, sclerotic lesion left pelvis.  Unknown primary at this time.  Will discuss with oncology for recommendations.  1:30 AM Spoke with oncology, Dr. Lorenso Courier-- will arrange OP follow-up for tissue biopsy.  Does not recommend any further labs for now, they will be collected at her follow-up. I have inboxed chart to him with patient information, she will also be given office information if not heard from them in just a few days.  All results and plan of care discussed with patient and fianc at bedside, they acknowledge understanding.  She understands importance of close follow-up, she will call cancer center if she does not hear from them in the next day or so.  She will return here for any new or acute changes.  Final Clinical Impression(s) / ED  Diagnoses Final diagnoses:  Pulmonary nodules  Pelvic mass    Rx / DC Orders ED Discharge Orders     None         Larene Pickett, PA-C 07/08/22 0133    Mesner, Corene Cornea, MD 07/08/22 [phone]

## 2022-07-12 ENCOUNTER — Telehealth: Payer: Self-pay | Admitting: *Deleted

## 2022-07-12 NOTE — Telephone Encounter (Signed)
Pt called regarding [facility] not receiving referral for her care.  RNCM reviewed chart to find information was inboxed to MD, who will handle OP follow-up.  Pt appreciative of information.

## 2022-07-13 ENCOUNTER — Telehealth: Payer: Self-pay | Admitting: Physician Assistant

## 2022-07-13 ENCOUNTER — Ambulatory Visit: Payer: Commercial Managed Care - HMO | Admitting: Family Medicine

## 2022-07-13 NOTE — Telephone Encounter (Signed)
Scheduled appointment per referral. Patient is aware of appointment date and time. Patient is aware to arrive 15 mins prior to appointment time and to bring updated insurance cards. Patient is aware of location.   

## 2022-07-14 ENCOUNTER — Inpatient Hospital Stay: Payer: Medicaid Other

## 2022-07-14 ENCOUNTER — Other Ambulatory Visit: Payer: Self-pay | Admitting: Radiology

## 2022-07-14 ENCOUNTER — Encounter: Payer: Self-pay | Admitting: Physician Assistant

## 2022-07-14 ENCOUNTER — Inpatient Hospital Stay: Payer: Medicaid Other | Attending: Physician Assistant | Admitting: Physician Assistant

## 2022-07-14 ENCOUNTER — Other Ambulatory Visit: Payer: Self-pay

## 2022-07-14 ENCOUNTER — Telehealth: Payer: Self-pay | Admitting: Physician Assistant

## 2022-07-14 ENCOUNTER — Other Ambulatory Visit: Payer: Self-pay | Admitting: Internal Medicine

## 2022-07-14 VITALS — BP 123/89 | HR 106 | Resp 16

## 2022-07-14 DIAGNOSIS — Z801 Family history of malignant neoplasm of trachea, bronchus and lung: Secondary | ICD-10-CM | POA: Insufficient documentation

## 2022-07-14 DIAGNOSIS — N63 Unspecified lump in unspecified breast: Secondary | ICD-10-CM | POA: Insufficient documentation

## 2022-07-14 DIAGNOSIS — C778 Secondary and unspecified malignant neoplasm of lymph nodes of multiple regions: Secondary | ICD-10-CM | POA: Diagnosis not present

## 2022-07-14 DIAGNOSIS — C7800 Secondary malignant neoplasm of unspecified lung: Secondary | ICD-10-CM | POA: Diagnosis not present

## 2022-07-14 DIAGNOSIS — F17210 Nicotine dependence, cigarettes, uncomplicated: Secondary | ICD-10-CM | POA: Insufficient documentation

## 2022-07-14 DIAGNOSIS — M25552 Pain in left hip: Secondary | ICD-10-CM | POA: Insufficient documentation

## 2022-07-14 DIAGNOSIS — R112 Nausea with vomiting, unspecified: Secondary | ICD-10-CM | POA: Diagnosis not present

## 2022-07-14 DIAGNOSIS — C801 Malignant (primary) neoplasm, unspecified: Secondary | ICD-10-CM

## 2022-07-14 DIAGNOSIS — R918 Other nonspecific abnormal finding of lung field: Secondary | ICD-10-CM | POA: Insufficient documentation

## 2022-07-14 DIAGNOSIS — C7951 Secondary malignant neoplasm of bone: Secondary | ICD-10-CM | POA: Diagnosis not present

## 2022-07-14 DIAGNOSIS — Z79899 Other long term (current) drug therapy: Secondary | ICD-10-CM | POA: Insufficient documentation

## 2022-07-14 DIAGNOSIS — R1900 Intra-abdominal and pelvic swelling, mass and lump, unspecified site: Secondary | ICD-10-CM

## 2022-07-14 DIAGNOSIS — R6881 Early satiety: Secondary | ICD-10-CM | POA: Diagnosis not present

## 2022-07-14 DIAGNOSIS — R590 Localized enlarged lymph nodes: Secondary | ICD-10-CM | POA: Insufficient documentation

## 2022-07-14 DIAGNOSIS — G893 Neoplasm related pain (acute) (chronic): Secondary | ICD-10-CM | POA: Diagnosis not present

## 2022-07-14 DIAGNOSIS — R634 Abnormal weight loss: Secondary | ICD-10-CM | POA: Diagnosis not present

## 2022-07-14 DIAGNOSIS — R102 Pelvic and perineal pain: Secondary | ICD-10-CM | POA: Insufficient documentation

## 2022-07-14 DIAGNOSIS — F1190 Opioid use, unspecified, uncomplicated: Secondary | ICD-10-CM | POA: Diagnosis not present

## 2022-07-14 DIAGNOSIS — M899 Disorder of bone, unspecified: Secondary | ICD-10-CM

## 2022-07-14 DIAGNOSIS — C799 Secondary malignant neoplasm of unspecified site: Secondary | ICD-10-CM

## 2022-07-14 LAB — CBC WITH DIFFERENTIAL (CANCER CENTER ONLY)
Abs Immature Granulocytes: 0.07 10*3/uL (ref 0.00–0.07)
Basophils Absolute: 0 10*3/uL (ref 0.0–0.1)
Basophils Relative: 0 %
Eosinophils Absolute: 0.1 10*3/uL (ref 0.0–0.5)
Eosinophils Relative: 0 %
HCT: 45.1 % (ref 36.0–46.0)
Hemoglobin: 14.8 g/dL (ref 12.0–15.0)
Immature Granulocytes: 0 %
Lymphocytes Relative: 16 %
Lymphs Abs: 2.6 10*3/uL (ref 0.7–4.0)
MCH: 28.7 pg (ref 26.0–34.0)
MCHC: 32.8 g/dL (ref 30.0–36.0)
MCV: 87.6 fL (ref 80.0–100.0)
Monocytes Absolute: 0.9 10*3/uL (ref 0.1–1.0)
Monocytes Relative: 5 %
Neutro Abs: 12.8 10*3/uL — ABNORMAL HIGH (ref 1.7–7.7)
Neutrophils Relative %: 79 %
Platelet Count: 405 10*3/uL — ABNORMAL HIGH (ref 150–400)
RBC: 5.15 MIL/uL — ABNORMAL HIGH (ref 3.87–5.11)
RDW: 13.4 % (ref 11.5–15.5)
WBC Count: 16.5 10*3/uL — ABNORMAL HIGH (ref 4.0–10.5)
nRBC: 0 % (ref 0.0–0.2)

## 2022-07-14 LAB — CMP (CANCER CENTER ONLY)
ALT: 17 U/L (ref 0–44)
AST: 25 U/L (ref 15–41)
Albumin: 3.6 g/dL (ref 3.5–5.0)
Alkaline Phosphatase: 87 U/L (ref 38–126)
Anion gap: 10 (ref 5–15)
BUN: 8 mg/dL (ref 6–20)
CO2: 27 mmol/L (ref 22–32)
Calcium: 9.1 mg/dL (ref 8.9–10.3)
Chloride: 100 mmol/L (ref 98–111)
Creatinine: 0.49 mg/dL (ref 0.44–1.00)
GFR, Estimated: >60 mL/min (ref >60)
Glucose, Bld: 91 mg/dL (ref 70–99)
Potassium: 4.3 mmol/L (ref 3.5–5.1)
Sodium: 137 mmol/L (ref 135–145)
Total Bilirubin: <0.1 mg/dL — ABNORMAL LOW (ref 0.3–1.2)
Total Protein: 8 g/dL (ref 6.5–8.1)

## 2022-07-14 MED ORDER — HYDROMORPHONE HCL 1 MG/ML IJ SOLN
1.0000 mg | Freq: Once | INTRAMUSCULAR | Status: AC
  Administered 2022-07-14: 1 mg via INTRAVENOUS
  Filled 2022-07-14: qty 1

## 2022-07-14 MED ORDER — ONDANSETRON HCL 8 MG PO TABS: 8.0000 mg | ORAL_TABLET | Freq: Three times a day (TID) | ORAL | 0 refills | Status: DC | PRN

## 2022-07-14 MED ORDER — HYDROCODONE-ACETAMINOPHEN 7.5-325 MG PO TABS: 1.0000 | ORAL_TABLET | Freq: Four times a day (QID) | ORAL | 0 refills | Status: DC | PRN

## 2022-07-14 MED ORDER — SODIUM CHLORIDE 0.9 % IV SOLN: Freq: Once | INTRAVENOUS | Status: AC

## 2022-07-14 NOTE — Telephone Encounter (Signed)
Called patient per 1/10 high priority in basket. Patient notified of new upcoming appointments.

## 2022-07-14 NOTE — Progress Notes (Signed)
[place] [phone]   [phone]  INITIAL CONSULTATION:  Patient Care Team: Crecencio Mc, MD as PCP - General (Internal Medicine)  CHIEF COMPLAINTS/PURPOSE OF CONSULTATION:  Abnormal CT scan concerning for metastatic disease  ONCOLOGIC HISTORY: 07/07/2022: Presented to ED with cough, swelling of the right neck and pelvic pain. CT neck: centrally necrotic right level 4 lymph node. Somewhat asymmetric enlargement of the right sternocleidomastoid muscle without focal abnormal enhancement or collection.  CT head: no acute intracranial process CT CAP: Innumerable pulmonary nodules, necrotic mediastinal and right thoracic inlet adenopathy. Destructive bone lesion in the left superior pubic ramus. Cystic mass in the pelvic midline and heterogeneous lesion posterior to the uterus.  1/101/2024: Establish care with hematology/oncology  HISTORY OF PRESENTING ILLNESS:  Natalie Hinton 37 y.o. female presents to the diagnostic clinic for recent CT imaging concerning for metastatic disease. She is accompanied by her fiance for this visit.   On exam today, Ms. Hamor reports having 30-40 lb weight loss over the last 3 months which was unintentional. She has no appetite and has early satiety. She reports intermittent episodes of nausea and vomiting. She has persistent pelvic pain for the last one month that is severe and not relief with OTC analgesics. She takes several doses of Advil with no relief. She reports pain with urination and bowel movement. She has noticed a yellow/white vaginal discharge. She has left hip pain that is persistent and worse with ambulation. She has persistent shortness of breath with exertion and at rest. She reports night sweats but denies any infectious symptoms such as fevers or chills. She has no other complaints. Rest of the 10 point ROS is below.  MEDICAL HISTORY:  History reviewed. No pertinent past medical  history.  SURGICAL HISTORY: History reviewed. No pertinent surgical history.  SOCIAL HISTORY: Social History   Socioeconomic History   Marital status: Single    Spouse name: Not on file   Number of children: Not on file   Years of education: Not on file   Highest education level: Not on file  Occupational History   Not on file  Tobacco Use   Smoking status: Every Day    Packs/day: 0.50    Years: 20.00    Total pack years: 10.00    Types: Cigarettes   Smokeless tobacco: Never  Vaping Use   Vaping Use: Never used  Substance and Sexual Activity   Alcohol use: Never   Drug use: Yes    Types: Marijuana   Sexual activity: Yes    Birth control/protection: None  Other Topics Concern   Not on file  Social History Narrative   Not on file   Social Determinants of Health   Financial Resource Strain: Not on file  Food Insecurity: Not on file  Transportation Needs: Not on file  Physical Activity: Not on file  Stress: Not on file  Social Connections: Not on file  Intimate Partner Violence: Not on file    FAMILY HISTORY: Family History  Problem Relation Age of Onset   Lung cancer Maternal Grandfather     ALLERGIES:  is allergic to penicillins.  MEDICATIONS:  Current Outpatient Medications  Medication Sig Dispense Refill   HYDROcodone-acetaminophen (NORCO) 7.5-325 MG tablet Take 1 tablet by mouth every 6 (six) hours as needed for moderate pain. 120 tablet 0   ondansetron (ZOFRAN) 8 MG tablet Take 1 tablet (8 mg total) by mouth every 8 (eight) hours as needed for nausea  or vomiting. 90 tablet 0   naproxen (NAPROSYN) 500 MG tablet Take 1 tablet (500 mg total) by mouth 2 (two) times daily. (Patient not taking: Reported on 07/14/2022) 30 tablet 0   No current facility-administered medications for this visit.   Facility-Administered Medications Ordered in Other Visits  Medication Dose Route Frequency Provider Last Rate Last Admin   HYDROmorphone (DILAUDID) injection 1 mg  1  mg Intravenous Once Vonnie Ligman T, PA-C        REVIEW OF SYSTEMS:   Constitutional: ( - ) fevers, ( - )  chills , ( +) night sweats Eyes: ( - ) blurriness of vision, ( - ) double vision, ( - ) watery eyes Ears, nose, mouth, throat, and face: ( - ) mucositis, ( - ) sore throat Respiratory: (+) cough, ( + ) dyspnea, ( - ) wheezes Cardiovascular: ( - ) palpitation, ( - ) chest discomfort, ( - ) lower extremity swelling Gastrointestinal:  ( + ) nausea, ( - ) heartburn, ( - ) change in bowel habits Skin: ( - ) abnormal skin rashes Lymphatics: ( - ) new lymphadenopathy, ( - ) easy bruising Neurological: ( - ) numbness, ( - ) tingling, ( - ) new weaknesses Behavioral/Psych: ( - ) mood change, ( - ) new changes  All other systems were reviewed with the patient and are negative.  PHYSICAL EXAMINATION: ECOG PERFORMANCE STATUS: 2 - Symptomatic, <50% confined to bed  Vitals:   07/14/22 1102  BP: (!) 136/93  Pulse: (!) 123  Resp: 18  Temp: 99 F (37.2 C)  SpO2: 98%   Filed Weights   07/14/22 1102  Weight: 87 lb 4.8 oz (39.6 kg)    GENERAL: thin appearing female  SKIN: skin color, texture, turgor are normal, no rashes or significant lesions EYES: conjunctiva are pink and non-injected, sclera clear OROPHARYNX: no exudate, no erythema; lips, buccal mucosa, and tongue normal  NECK: supple, non-tender LYMPH:  +Firm palpable right neck lymph node LUNGS: clear to auscultation and percussion with normal breathing effort HEART: regular rate & rhythm and no murmurs and no lower extremity edema ABDOMEN: +Abdominal distention in the pelvic region that is tender to palpation.  Musculoskeletal: no cyanosis of digits. +Clubbing of fingers PSYCH: alert & oriented x 3, fluent speech NEURO: no focal motor/sensory deficits  LABORATORY DATA:  I have reviewed the data as listed    Latest Ref Rng & Units 07/07/2022    3:29 PM 12/18/2019    7:00 PM 12/09/2007    4:53 PM  CBC  WBC 4.0 - 10.5 K/uL 15.7   5.6  11.4   Hemoglobin 12.0 - 15.0 g/dL 13.3  15.6  12.4   Hematocrit 36.0 - 46.0 % 40.8  45.7  36.1   Platelets 150 - 400 K/uL 493  203  199        Latest Ref Rng & Units 07/07/2022    3:29 PM 12/18/2019    8:10 PM 12/09/2007    4:53 PM  CMP  Glucose 70 - 99 mg/dL 141  85  88   BUN 6 - 20 mg/dL 7  <5  6   Creatinine 0.44 - 1.00 mg/dL 0.54  0.54  0.69   Sodium 135 - 145 mmol/L 135  139  135   Potassium 3.5 - 5.1 mmol/L 4.6  4.0  3.5   Chloride 98 - 111 mmol/L 100  105  102   CO2 22 - 32 mmol/L 22  24  24  Calcium 8.9 - 10.3 mg/dL 9.7  9.0  9.1   Total Protein 6.5 - 8.1 g/dL 8.3   7.2   Total Bilirubin 0.3 - 1.2 mg/dL 0.4   0.8   Alkaline Phos 38 - 126 U/L 83   55   AST 15 - 41 U/L 25   20   ALT 0 - 44 U/L 12   11      RADIOGRAPHIC STUDIES: I have personally reviewed the radiological images as listed and agreed with the findings in the report. CT CHEST ABDOMEN PELVIS W CONTRAST  Result Date: 07/07/2022 CLINICAL DATA:  Metastatic disease evaluation, occult malignancy. Back pain, knee pain, shoulder pain and headache. This area. Patient reports weight loss, night sweats, body pain and bone pain. EXAM: CT CHEST, ABDOMEN, AND PELVIS WITH CONTRAST TECHNIQUE: Multidetector CT imaging of the chest, abdomen and pelvis was performed following the standard protocol during bolus administration of intravenous contrast. RADIATION DOSE REDUCTION: This exam was performed according to the departmental dose-optimization program which includes automated exposure control, adjustment of the mA and/or kV according to patient size and/or use of iterative reconstruction technique. CONTRAST:  3m OMNIPAQUE IOHEXOL 350 MG/ML SOLN COMPARISON:  Noncontrast chest CT earlier today. FINDINGS: CT CHEST FINDINGS Cardiovascular: Normal caliber thoracic aorta. No central pulmonary embolus on this exam not tailored to pulmonary artery assessment. No pericardial effusion or thickening. Mediastinum/Nodes: 2.6 cm lesion with  central hypodensity in the anterior right paratracheal station is likely necrotic node series 3, image 19. Presumed necrotic right thoracic inlet node measuring 2.6 cm series 3, image 7. there additional necrotic nodes in the highest mediastinum. Ill-defined right hilar soft tissue density likely represents adenopathy, series 3, image 26. There is also a 10 mm right hilar node series 3, image 28. No esophageal wall thickening. No discrete thyroid nodule. No axillary adenopathy. Lungs/Pleura: Mild apical emphysema or blebs. Numeral wall bilateral pulmonary nodules within both lungs typical of metastatic disease. Index lesion in the right lower lobe measures 19 x 16 mm, series 5, image 128. No pleural effusion or significant pleural thickening. Minimal retained mucus within the central bronchi. There is slight narrowing of the right upper lobe bronchus due to mediastinal adenopathy. Mild right lower lobe bronchiectasis with occasional mucoid impaction. Musculoskeletal: No obvious destructive lytic or blastic osseous lesion. No dominant breast mass. Generalized paucity of body fat. CT ABDOMEN PELVIS FINDINGS Hepatobiliary: Water density 12 mm lesion in the right hepatic dome series 3, image 51, typically cyst. No evidence of enhancing liver lesion. Gallbladder physiologically distended, no calcified stone. No biliary dilatation. Pancreas: Suboptimally assessed due to lack of enteric contrast and paucity of intra-abdominal fat. There is no obvious pancreatic mass. No ductal dilatation or inflammation. Spleen: Normal in size. Punctate calcified granuloma. No splenic lesion. Adrenals/Urinary Tract: No adrenal nodule. Dilatation of both renal pelvis, left greater than right. No perinephric edema. Intermittent ureteral dilatation. The urinary bladder is slightly displaced into the left pelvis by pelvic mass. No bladder wall thickening. No suspicious renal abnormality. No definite renal calculi, although excreted contrast in  the renal collecting systems obscured assessment. Stomach/Bowel: Bowel assessment is limited in the absence of enteric contrast and paucity of intra-abdominal fat. Fluid/ingested material within the stomach. No bowel dilatation or evidence of obstruction moderate volume of colonic stool within the ascending, transverse and descending colon. Sigmoid colon is mildly redundant and displaced by pelvic mass. Vascular/Lymphatic: Patent portal, splenic and mesenteric veins. Normal caliber abdominal aorta. Limited assessment for adenopathy in the  absence of enteric contrast and paucity of intra-abdominal fat. There is no convincing enlarged lymph nodes in the abdomen or pelvis. Reproductive: Large midline cystic pelvic mass measures 14 x 10.8 x 11.3 cm, series 3, image 100. Extending superiorly from this on the right is a dilated fluid-filled tubular structure that may represent hydrosalpinx query separate cystic component. This may arise from the right ovary which is not discretely visualized. No convincing solid components. The left ovary is also not discretely visualized. The uterus is deviated into the right pelvis. Posterior to the uterus in the posterior pelvic midline the heterogeneous lobulated heterogeneously enhancing lesion measuring 5.6 x 4.9 cm, series 3, image 104, possible connection to the lower aspect of the uterus but not definite. There may be heterogeneity of the lower cervix and vagina. Other: Suspect minimal free fluid in the right pelvis. No upper abdominal ascites or free fluid. No convincing omental thickening, although paucity of body fat limits assessment. Musculoskeletal: Destructive lesion involving the left superior pubic ramus approaches the pubic body with cortical destruction, series 3, image 116. there is an extraosseous soft tissue component extending into the adjacent obturator musculature. No definite additional bone lesion. IMPRESSION: 1. Findings typical of metastatic disease in the  thorax with innumerable pulmonary nodules, necrotic mediastinal and right thoracic inlet adenopathy. 2. Cystic mass in the pelvic midline, given size, suspicious for neoplasm. There is also heterogeneous lesion posterior to the uterus, unclear if this represents a primary lesion, metastatic deposit or arises from the uterus has a pedunculated lesion. 3. Destructive bone lesion in the left superior pubic ramus typical of metastatic disease. No definite additional osseous metastatic disease seen by CT, however PET scan or bone scan may be helpful for a complete osseous assessment. 4. Site of primary neoplasm is not confidently confirmed, although given size of cystic pelvic mass and adjacent enhancing structure, GYN neoplasm is considered. Recommend oncologic referral for consideration of multi disciplinary assessment. Tissue sampling may be helpful. The right thoracic inlet node may be an accessible site for biopsy. Electronically Signed   By: Keith Rake M.D.   On: 07/07/2022 18:27   CT Soft Tissue Neck W Contrast  Result Date: 07/07/2022 CLINICAL DATA:  Metastatic disease evaluation; multiple lung nodules EXAM: CT HEAD WITHOUT AND WITH CONTRAST CT NECK WITH CONTRAST TECHNIQUE: Contiguous axial images were obtained from the base of the skull through the vertex without and with intravenous contrast. Multidetector CT imaging of the and neck was performed using the standard protocol following the bolus administration of intravenous contrast. RADIATION DOSE REDUCTION: This exam was performed according to the departmental dose-optimization program which includes automated exposure control, adjustment of the mA and/or kV according to patient size and/or use of iterative reconstruction technique. CONTRAST:  20m OMNIPAQUE IOHEXOL 350 MG/ML SOLN COMPARISON:  CT chest 07/07/2022; no prior head or neck CT FINDINGS: CT HEAD FINDINGS Brain: No evidence of acute infarct, hemorrhage, mass, mass effect, or midline shift.  No hydrocephalus or extra-axial fluid collection. No abnormal enhancement. Vascular: No hyperdense vessel. Normal arterial and venous enhancement. Skull: Normal. Negative for fracture or focal lesion. Sinuses/Orbits: Mucosal thickening in the right sphenoid sinus. Otherwise clear paranasal sinuses. No acute finding in the orbits. Other: The mastoid air cells are well aerated. CT NECK FINDINGS Pharynx and larynx: Normal. No mass or swelling. Salivary glands: No inflammation, mass, or stone. Thyroid: Hypoenhancing nodule in the right thyroid lobe, which measures up to 7 mm (series 9, image 76), for which no follow-up is  currently indicated. (Reference: J Am Coll Radiol. 2015 Feb;12(2): 143-50) Lymph nodes: Centrally necrotic right level 4 lymph node measures 2.8 x 2.5 x 3.6 cm (AP x TR x CC) (series 9, image 79 and series 8, image 62). Prominent adjacent right level 4 lymph node (series 9, image 73), which is not significantly enlarged or centrally necrotic. No other abnormal cervical lymph nodes. Please see same-day CT chest report for subclavicular lymph nodes. Vascular: Patent. Skeleton: No acute osseous abnormality. Edentulous. Upper chest: Please see same-day CT chest Other: Somewhat asymmetric enlargement of the right sternocleidomastoid muscle, without focal abnormal enhancement or collection, which may be reactive to the aforementioned right level 4 necrotic lymph node. IMPRESSION: 1. Centrally necrotic right level 4 lymph node, consistent with metastatic disease. Please see same-day CT chest report for additional subclavicular lymph nodes. 2. No acute intracranial process. 3. Somewhat asymmetric enlargement of the right sternocleidomastoid muscle, without focal abnormal enhancement or collection, which may be reactive. 4. No evidence of metastatic disease in the brain or skull base. 5. No additional acute process in the neck. Electronically Signed   By: Merilyn Baba M.D.   On: 07/07/2022 18:14   CT Head W  or Wo Contrast  Result Date: 07/07/2022 CLINICAL DATA:  Metastatic disease evaluation; multiple lung nodules EXAM: CT HEAD WITHOUT AND WITH CONTRAST CT NECK WITH CONTRAST TECHNIQUE: Contiguous axial images were obtained from the base of the skull through the vertex without and with intravenous contrast. Multidetector CT imaging of the and neck was performed using the standard protocol following the bolus administration of intravenous contrast. RADIATION DOSE REDUCTION: This exam was performed according to the departmental dose-optimization program which includes automated exposure control, adjustment of the mA and/or kV according to patient size and/or use of iterative reconstruction technique. CONTRAST:  7m OMNIPAQUE IOHEXOL 350 MG/ML SOLN COMPARISON:  CT chest 07/07/2022; no prior head or neck CT FINDINGS: CT HEAD FINDINGS Brain: No evidence of acute infarct, hemorrhage, mass, mass effect, or midline shift. No hydrocephalus or extra-axial fluid collection. No abnormal enhancement. Vascular: No hyperdense vessel. Normal arterial and venous enhancement. Skull: Normal. Negative for fracture or focal lesion. Sinuses/Orbits: Mucosal thickening in the right sphenoid sinus. Otherwise clear paranasal sinuses. No acute finding in the orbits. Other: The mastoid air cells are well aerated. CT NECK FINDINGS Pharynx and larynx: Normal. No mass or swelling. Salivary glands: No inflammation, mass, or stone. Thyroid: Hypoenhancing nodule in the right thyroid lobe, which measures up to 7 mm (series 9, image 76), for which no follow-up is currently indicated. (Reference: J Am Coll Radiol. 2015 Feb;12(2): 143-50) Lymph nodes: Centrally necrotic right level 4 lymph node measures 2.8 x 2.5 x 3.6 cm (AP x TR x CC) (series 9, image 79 and series 8, image 62). Prominent adjacent right level 4 lymph node (series 9, image 73), which is not significantly enlarged or centrally necrotic. No other abnormal cervical lymph nodes. Please see  same-day CT chest report for subclavicular lymph nodes. Vascular: Patent. Skeleton: No acute osseous abnormality. Edentulous. Upper chest: Please see same-day CT chest Other: Somewhat asymmetric enlargement of the right sternocleidomastoid muscle, without focal abnormal enhancement or collection, which may be reactive to the aforementioned right level 4 necrotic lymph node. IMPRESSION: 1. Centrally necrotic right level 4 lymph node, consistent with metastatic disease. Please see same-day CT chest report for additional subclavicular lymph nodes. 2. No acute intracranial process. 3. Somewhat asymmetric enlargement of the right sternocleidomastoid muscle, without focal abnormal enhancement or collection, which  may be reactive. 4. No evidence of metastatic disease in the brain or skull base. 5. No additional acute process in the neck. Electronically Signed   By: Merilyn Baba M.D.   On: 07/07/2022 18:14   CT CHEST WO CONTRAST  Addendum Date: 07/07/2022   ADDENDUM REPORT: 07/07/2022 16:25 CLINICAL DATA:  Cough, back pain dysuria EXAM: CT CHEST WITHOUT CONTRAST TECHNIQUE: Multidetector CT imaging of the chest was performed following the standard protocol without IV contrast. RADIATION DOSE REDUCTION: This exam was performed according to the departmental dose-optimization program which includes automated exposure control, adjustment of the mA and/or kV according to patient size and/or use of iterative reconstruction technique. COMPARISON:  Chest x-ray 07/07/2022 FINDINGS: Cardiovascular: Grossly normal caliber thoracic aorta. No vascular calcifications. Heart is not enlarged. Small pericardial effusion. Mediastinum/Nodes: No abnormal lymph nodes present in the axillary region on this noncontrast exam. Evaluation of the hila is limited. There is presumed abnormal lymph nodes right paratracheal. On series 3, image 17 area measures 3.2 by 3.4 cm. Additional presumed lymph node though the thoracic inlet on the right side  lateral to the right thyroid lobe measuring 3.2 by 2.6 cm on series 3, image 4. Normal caliber thoracic esophagus. Thyroid gland is unremarkable. Lungs/Pleura: As seen on the x-ray there are numerous lung masses identified. At least 25 in the left lung and close to 30 on the right. These are of varying size. Larger foci seen in the right lower lobe on image 109 of series 6 measures 19 by 18 mm. Larger foci on left seen posterior to the left hilum on image 84 of series 6 measuring 15 by 15 mm. These are diffusely distributed. Are some areas of mild debris along the right main bronchus. No pleural effusion or pneumothorax. Few subpleural blebs at the apices bilaterally. Upper Abdomen: Adrenal glands are grossly preserved. There appears to be some high density material or possible contrast in the colon. Presumed hepatic cyst. With the lung findings unless there are specific history contrast study of the chest abdomen pelvis may be of some benefit. Musculoskeletal: No chest wall mass or suspicious bone lesions identified. With the lung findings of whole-body bone scan may also be of benefit when clinically appropriate IMPRESSION: Multiple bilateral soft tissue lung nodules, up to 30 on the right and 25 on the left measuring up to 2 cm. Multiple areas of abnormal lymph nodes in the right thoracic inlet, mediastinum. Please correlate for any known history of malignancy or further workup is recommended Case to be discussed with the emergency room physician by myself on the afternoon of 07/07/2022 Case discussed with PA Rica Mote by myself on the afternoon of 07/07/2022 at 1623 pm EST Electronically Signed   By: Jill Side M.D.   On: 07/07/2022 16:25   Result Date: 07/07/2022 CLINICAL DATA:  Cough, back pain dysuria EXAM: CT CHEST WITHOUT CONTRAST TECHNIQUE: Multidetector CT imaging of the chest was performed following the standard protocol without IV contrast. RADIATION DOSE REDUCTION: This exam was performed according to  the departmental dose-optimization program which includes automated exposure control, adjustment of the mA and/or kV according to patient size and/or use of iterative reconstruction technique. COMPARISON:  Chest x-ray 07/07/2022 FINDINGS: Cardiovascular: Mostly normal caliber thoracic aorta. No vascular calcifications. Heart is not enlarged. Small pericardial effusion. Mediastinum/Nodes: No abnormal dorsum and present in the axillary region on this noncontrast exam. Virus hila is limited. There is presumed abnormal lymph nodes right paratracheal. On series 3, image 17 area measures 3.2  by 3.4 cm. Additional presumed lymph node though the thoracic inlet on the right side lateral to the right thyroid lobe measuring 3.2 by 2.6 cm on series 3, image 4. Normal caliber thoracic esophagus. Thyroid gland is unremarkable. Lungs/Pleura: As seen on the x-ray there are numerous lung masses identified. At least 25% in the left lung and close to 30 on the right. These are of varying size. Larger foci seen in the right lower lobe on image 109 of series 6 measures 19 by 18 mm. Larger foci on left seen posterior to the left hilum on image 84 of series 6 measuring 15 by 15 mm. These are diffusely distributed. Are some areas of mild debris along the right main bronchus. No pleural effusion or pneumothorax. Few subpleural blebs at the apices bilaterally. Upper Abdomen: Adrenal glands are grossly preserved. There appears to be some high density material or possible contrast in the colon. Presumed hepatic cysts. With the lung findings unless there are specific history contrast study of the chest abdomen pelvis may be of some benefit. Musculoskeletal: No chest wall mass or suspicious bone lesions identified. With the lung findings of whole-body bone scan may also be of benefit when clinically appropriate IMPRESSION: Multiple bilateral soft tissue lung nodules, up to 30 on the right and 25 on the left measuring up to 2 cm. Multiple areas  of abnormal lymph nodes in the right thoracic inlet, mediastinum. Please correlate for any known history of malignancy or further workup is recommended Case to be discussed with the emergency room physician by myself on the afternoon of 07/07/2022 Electronically Signed: By: Jill Side M.D. On: 07/07/2022 16:18   DG Chest 2 View  Result Date: 07/07/2022 CLINICAL DATA:  One month of cough. EXAM: CHEST - 2 VIEW COMPARISON:  Chest radiograph February 09, 2020. FINDINGS: The heart size and mediastinal contours are within normal limits. Innumerable rounded solid bilateral lower lobe predominant pulmonary nodules. No visible pleural effusion or pneumothorax. The visualized skeletal structures are unremarkable. IMPRESSION: Innumerable rounded solid bilateral, lower lobe predominant pulmonary nodules, highly suspicious for metastatic disease. Recommend further evaluation with chest CT. These results will be called to the ordering clinician or representative by the Radiologist Assistant, and communication documented in the PACS or [employer]. Electronically Signed   By: Dahlia Bailiff M.D.   On: 07/07/2022 13:17    ASSESSMENT & PLAN Novelle M Mittleman is a 37 y.o. female who presents to the diagnostic clinic for evaluation of recent CT imaging concerning for metastatic disease. I review the CT imaging with the patient that details the areas of concerning which includes the pelvic masses, innumerable lung nodules, adenopathy and destructive bone lesion in the left superior pubic ramus. We discussed the recommended workup including laboratory evaluation today followed by urgent core needle biopsy of one of the target lesions. I discussed case with IR who recommended needle biopsy of right cervical lymph node and aspiration of cystic pelvic mass. We will follow up with the patient once biopsy is obtained to confirm diagnosis and discuss treatment recommendations.   #Abnormal CT scan concerning for metastatic disease  involving the lungs, lymph nodes, pelvic masses: --Seen on CT neck and CT CAP from 07/07/2022 --Need STAT biopsy for tissue diagnosis. Disussed with IR and plan for core needle biopsy of right cervical lymph node and aspiration of cystic pelvic mass.  --Labs today to CBC, CMP and CA 125 marker --RTC once workup is complete  #Pelvic pain/left hip pain: --Likely seondary pelvic  masses and destructive bonel esion in left superior pubic ramus --Referral sent to GYN ONC surgery to evaluate for cytoreductive surgery of pelvic masses as patient is symptomatic with pain.  --Referral sent to radiation oncology to evaluate for palliative radiation to bone lesion --Gave IV dilaudid 1 mg during clinic visit and sent prescription for Norco 7.5 mg/325 mg q 4-6 hours for pain relief.  #Weight loss: --Sent urgent referral to [facility] nutrition team --Advised patient to eat small, frequent meals and supplement with protein shakes  #Nausea/vomiting: --sent prescription for zofran 8 mg q 8 hours  #Age of diagnosis: --Sent referral to genetics team due to young onset of presumed cancer diagnosis   Orders Placed This Encounter  Procedures   [place] CORE BIOPSY (LYMPH NODES)    Standing Status:   Future    Standing Expiration Date:   07/15/2023    Order Specific Question:   Lab orders requested (DO NOT place separate lab orders, these will be automatically ordered during procedure specimen collection):    Answer:   Surgical Pathology    Order Specific Question:   Reason for Exam (SYMPTOM  OR DIAGNOSIS REQUIRED)    Answer:   biopsy of right cervical lymph node. CT scan concerning for diffuse metastatic disease    Order Specific Question:   Preferred location?    Answer:   [facility]   [place] FNA SOFT TISSUE    Standing Status:   Future    Standing Expiration Date:   07/15/2023    Order Specific Question:   Lab orders requested (DO NOT place separate lab orders, these will be automatically ordered during  procedure specimen collection):    Answer:   Cytology - Non Pap    Order Specific Question:   Reason for Exam (SYMPTOM  OR DIAGNOSIS REQUIRED)    Answer:   large cystic lesion    Order Specific Question:   Preferred location?    Answer:   [facility]   CBC with Differential ([place] Only)    Standing Status:   Future    Number of Occurrences:   1    Standing Expiration Date:   07/15/2023   CMP ([place] only)    Standing Status:   Future    Number of Occurrences:   1    Standing Expiration Date:   07/15/2023   CA 125    Standing Status:   Future    Number of Occurrences:   1    Standing Expiration Date:   07/14/2023   Ambulatory referral to Gynecologic Oncology    Referral Priority:   Urgent    Referral Type:   Consultation    Referral Reason:   Specialty Services Required    Requested Specialty:   Gynecologic Oncology    Number of Visits Requested:   1   Ambulatory referral to Radiation Oncology    Referral Priority:   Routine    Referral Type:   Consultation    Referral Reason:   Specialty Services Required    Requested Specialty:   Radiation Oncology    Number of Visits Requested:   1   Ambulatory Referral to [facility] Nutrition    Referral Priority:   Urgent    Referral Type:   Consultation    Referral Reason:   Specialty Services Required    Number of Visits Requested:   1   Ambulatory referral to Genetics    Referral Priority:   Routine  Referral Type:   Consultation    Referral Reason:   Specialty Services Required    Number of Visits Requested:   1    All questions were answered. The patient knows to call the clinic with any problems, questions or concerns.  I have spent a total of 60 minutes minutes of face-to-face and non-face-to-face time, preparing to see the patient, obtaining and/or reviewing separately obtained history, performing a medically appropriate examination, counseling and educating the patient, ordering medications/tests/procedures,  referring and communicating with other health care professionals, documenting clinical information in the electronic health record, and care coordination.   Dede Query, PA-C Department of Hematology/Oncology [place] at [facility] Phone: [phone]  Patient was seen with Dr. Lorenso Courier  I have read the above note and personally examined the patient. I agree with the assessment and plan as noted above.  Briefly Mrs. Natalie Hinton is a 37 year old female with medical history significant for widely metastatic disease of unclear etiology.  At this time she has numerous lesions throughout the skeleton, lungs, and lymph nodes concerning for metastatic disease.  She is in intractable pain during her visit today and therefore we are sending her to symptom management for IV pain control.  She will require immediate tissue sampling in order to help confirm the diagnosis.  The patient voiced her understanding of our plan moving forward.  We will additionally order the CA125 tumor marker on suspicious for GYN origin of her malignancy.   Ledell Peoples, MD Department of Hematology/Oncology [place] at [facility] Phone: [phone] Pager: [phone] Email: [email or web address]

## 2022-07-14 NOTE — Patient Instructions (Signed)

## 2022-07-15 ENCOUNTER — Ambulatory Visit (HOSPITAL_COMMUNITY): Payer: Commercial Managed Care - HMO

## 2022-07-15 ENCOUNTER — Telehealth (HOSPITAL_COMMUNITY): Payer: Self-pay

## 2022-07-15 ENCOUNTER — Ambulatory Visit (HOSPITAL_COMMUNITY): Admission: RE | Admit: 2022-07-15 | Payer: Commercial Managed Care - HMO | Source: Ambulatory Visit

## 2022-07-15 LAB — CA 125: Cancer Antigen (CA) 125: 24.6 U/mL (ref 0.0–38.1)

## 2022-07-15 NOTE — Telephone Encounter (Signed)
Called pt twice to find out if she was coming for procedure today at [facility], no answer, left vm. AW

## 2022-07-16 ENCOUNTER — Telehealth: Payer: Self-pay | Admitting: *Deleted

## 2022-07-16 NOTE — Telephone Encounter (Signed)
Spoke with the patient regarding the referral to GYN oncology. Patient scheduled as new patient with Dr Ernestina Patches on 1/22 at 11:15 am. Patient given an arrival time of *10:45 am.  Explained to the patient the the doctor will perform a pelvic exam at this visit. Patient given the policy that no visitors under the 16 yrs are allowed in the [place]. Patient given the address/phone number for the clinic and that the center offers free valet service. Patient aware of the new mask mandate.

## 2022-07-20 ENCOUNTER — Other Ambulatory Visit: Payer: Self-pay | Admitting: Radiology

## 2022-07-20 ENCOUNTER — Inpatient Hospital Stay: Payer: Medicaid Other

## 2022-07-20 ENCOUNTER — Inpatient Hospital Stay (HOSPITAL_BASED_OUTPATIENT_CLINIC_OR_DEPARTMENT_OTHER): Payer: Medicaid Other | Admitting: Genetic Counselor

## 2022-07-20 ENCOUNTER — Inpatient Hospital Stay (HOSPITAL_BASED_OUTPATIENT_CLINIC_OR_DEPARTMENT_OTHER): Payer: Medicaid Other | Admitting: Physician Assistant

## 2022-07-20 ENCOUNTER — Other Ambulatory Visit: Payer: Self-pay | Admitting: Physician Assistant

## 2022-07-20 ENCOUNTER — Inpatient Hospital Stay: Payer: Medicaid Other | Admitting: Dietician

## 2022-07-20 ENCOUNTER — Other Ambulatory Visit: Payer: Self-pay | Admitting: Genetic Counselor

## 2022-07-20 ENCOUNTER — Other Ambulatory Visit (HOSPITAL_COMMUNITY): Payer: Self-pay | Admitting: Student

## 2022-07-20 ENCOUNTER — Other Ambulatory Visit: Payer: Self-pay

## 2022-07-20 VITALS — BP 144/93 | HR 104 | Temp 98.2°F | Resp 16

## 2022-07-20 DIAGNOSIS — R1900 Intra-abdominal and pelvic swelling, mass and lump, unspecified site: Secondary | ICD-10-CM

## 2022-07-20 DIAGNOSIS — Z170 Estrogen receptor positive status [ER+]: Secondary | ICD-10-CM

## 2022-07-20 LAB — GENETIC SCREENING ORDER

## 2022-07-20 MED ORDER — SODIUM CHLORIDE 0.9 % IV SOLN: Freq: Once | INTRAVENOUS | Status: DC

## 2022-07-20 MED ORDER — OXYCODONE HCL 10 MG PO TABS: 5.0000 mg | ORAL_TABLET | ORAL | 0 refills | Status: DC | PRN

## 2022-07-20 MED ORDER — HYDROMORPHONE HCL 1 MG/ML IJ SOLN
1.0000 mg | Freq: Once | INTRAMUSCULAR | Status: AC
  Administered 2022-07-20: 1 mg via INTRAVENOUS
  Filled 2022-07-20: qty 1

## 2022-07-20 NOTE — Progress Notes (Signed)
Symptom Management Consult note [place]    Patient Care Team: Crecencio Mc, MD as PCP - General (Internal Medicine)    Name of the patient: Natalie Hinton  [id number]  May 27, 1986   Date of visit: 07/20/2022   Chief Complaint/Reason for visit: uncontrolled pain    ASSESSMENT & PLAN: Patient is a 37 y.o. female currently undergoing work up of a pelvic mass and cervical lymphadenopathy.  I have viewed most recent diagnostic clinic note and lab work.    #) Pelvic mass and cervical lymphadenopathy -Patient currently undergoing work up for this. -Is scheduled to have [place] biopsies tomorrow, missed her last appointment. Discussed the importance of having these biopsies performed.   #) Pelvic pain -from known mass. -Multiple RNs attempted IV multiple times for IVF and pain medication. They were unable to obtain access and IV team consult ordered. Patient is agreeable to IM pain medication because of the severity of pain and not wanting to wait longer. -Patient given IM dilaudid. On reassessment she is resting comfortably. She is tolerating PO intake and prefers to hold off on future IV attempts today for IVF. -Pain medication oxycodone called in by onc PA Murray Hodgkins earlier today.      Heme/Onc History: Oncology History   No history exists.      Interval history-: Natalie Hinton is a 37 y.o. female currently undergoing workup for pelvic mass and cervical lymphadenopathy seen in [facility] today with chief complaint of uncontrollable pain.  Patient was here for visit with dietitian when she reported to be in severe pain.  She was unable to pick up her previously prescribed pain medication because of the cost.  Patient states she has 9 out of 10 pain in her pelvis.  This is the pain she has been previously experiencing.  The pain is constant.  She describes the pain as throbbing and aching.  Pain does not radiate.  She has been taking Tylenol without symptom relief.  She  denies any associated nausea or vomiting and is tolerating p.o. intake.  Patient states she missed her biopsy appointments because her car broke down and she had severe pain.  She denies any fever or chills.    ROS  All other systems are reviewed and are negative for acute change except as noted in the HPI.    Allergies  Allergen Reactions   Penicillins      No past medical history on file.   No past surgical history on file.  Social History   Socioeconomic History   Marital status: Single    Spouse name: Not on file   Number of children: Not on file   Years of education: Not on file   Highest education level: Not on file  Occupational History   Not on file  Tobacco Use   Smoking status: Every Day    Packs/day: 0.50    Years: 20.00    Total pack years: 10.00    Types: Cigarettes   Smokeless tobacco: Never  Vaping Use   Vaping Use: Never used  Substance and Sexual Activity   Alcohol use: Never   Drug use: Yes    Types: Marijuana   Sexual activity: Yes    Birth control/protection: None  Other Topics Concern   Not on file  Social History Narrative   Not on file   Social Determinants of Health   Financial Resource Strain: Not on file  Food Insecurity: Not on file  Transportation  Needs: Not on file  Physical Activity: Not on file  Stress: Not on file  Social Connections: Not on file  Intimate Partner Violence: Not on file    Family History  Problem Relation Age of Onset   Lung cancer Maternal Grandfather      Current Outpatient Medications:    ondansetron (ZOFRAN) 8 MG tablet, Take 1 tablet (8 mg total) by mouth every 8 (eight) hours as needed for nausea or vomiting., Disp: 90 tablet, Rfl: 0   oxyCODONE 10 MG TABS, Take 0.5 tablets (5 mg total) by mouth every 4 (four) hours as needed for severe pain., Disp: 90 tablet, Rfl: 0 No current facility-administered medications for this visit.  Facility-Administered Medications Ordered in Other Visits:     0.9 %  sodium chloride infusion, , Intravenous, Once, Thayil, Irene T, PA-C  PHYSICAL EXAM: ECOG FS:1 - Symptomatic but completely ambulatory  T: 98.2 BP: 144/97 HR: 104 Resp: 18 O2: 100% Physical Exam Vitals and nursing note reviewed.  Constitutional:      Appearance: She is underweight. She is not ill-appearing or toxic-appearing.  HENT:     Head: Normocephalic.     Nose: Nose normal.  Eyes:     Conjunctiva/sclera: Conjunctivae normal.  Neck:     Vascular: No JVD.  Cardiovascular:     Rate and Rhythm: Regular rhythm. Tachycardia present.     Pulses: Normal pulses.     Heart sounds: Normal heart sounds.  Pulmonary:     Effort: Pulmonary effort is normal.     Breath sounds: Normal breath sounds.  Abdominal:     General: There is distension.     Tenderness: There is abdominal tenderness. There is no right CVA tenderness or left CVA tenderness.  Musculoskeletal:     Cervical back: Normal range of motion.  Skin:    General: Skin is warm and dry.  Neurological:     Mental Status: She is oriented to person, place, and time.  Psychiatric:        Mood and Affect: Affect is tearful.        LABORATORY DATA: I have reviewed the data as listed    Latest Ref Rng & Units 07/14/2022    2:20 PM 07/07/2022    3:29 PM 12/18/2019    7:00 PM  CBC  WBC 4.0 - 10.5 K/uL 16.5  15.7  5.6   Hemoglobin 12.0 - 15.0 g/dL 14.8  13.3  15.6   Hematocrit 36.0 - 46.0 % 45.1  40.8  45.7   Platelets 150 - 400 K/uL 405  493  203         Latest Ref Rng & Units 07/14/2022    2:20 PM 07/07/2022    3:29 PM 12/18/2019    8:10 PM  CMP  Glucose 70 - 99 mg/dL 91  141  85   BUN 6 - 20 mg/dL 8  7  <5   Creatinine 0.44 - 1.00 mg/dL 0.49  0.54  0.54   Sodium 135 - 145 mmol/L 137  135  139   Potassium 3.5 - 5.1 mmol/L 4.3  4.6  4.0   Chloride 98 - 111 mmol/L 100  100  105   CO2 22 - 32 mmol/L '27  22  24   '$ Calcium 8.9 - 10.3 mg/dL 9.1  9.7  9.0   Total Protein 6.5 - 8.1 g/dL 8.0  8.3    Total Bilirubin  0.3 - 1.2 mg/dL <0.1  0.4    Alkaline  Phos 38 - 126 U/L 87  83    AST 15 - 41 U/L 25  25    ALT 0 - 44 U/L 17  12         RADIOGRAPHIC STUDIES (from last 24 hours if applicable) I have personally reviewed the radiological images as listed and agreed with the findings in the report. No results found.      Visit Diagnosis: 1. Pelvic mass      No orders of the defined types were placed in this encounter.   All questions were answered. The patient knows to call the clinic with any problems, questions or concerns. No barriers to learning was detected.  I have spent a total of 20 minutes minutes of face-to-face and non-face-to-face time, preparing to see the patient, obtaining and/or reviewing separately obtained history, performing a medically appropriate examination, counseling and educating the patient, ordering tests, documenting clinical information in the electronic health record, and care coordination (communications with other health care professionals or caregivers).    Thank you for allowing me to participate in the care of this patient.    Barrie Folk, PA-C Department of Hematology/Oncology [employer] at [facility] Phone: [phone]  [phone]    07/20/2022 5:01 PM

## 2022-07-20 NOTE — Progress Notes (Signed)
Multiple IV attempts made by several Rns with patient consent. Patient gave verbal permission to attempt IV in order to receive IV pain medication and fluids prior to IV team arrival. IV attempts were ultimately unsuccessful and patient agreed to IM dilaudid and to have PO fluids while in [facility] to avoid further IV attempts.

## 2022-07-20 NOTE — Progress Notes (Signed)
Nutrition Assessment   Reason for Assessment: Urgent referral    ASSESSMENT: 37 year old female with abnormal CT scan (1/3) that show pelvic masses, concerning for metastatic disease involving lungs, lymph nodes. Biopsies scheduled for 1/17. She is under the care of Dr. Lorenso Courier.   Patient arrived to appointment appearing to be in pain. She reports she has not taken anything for pain since this morning. Patient states she went to pick up pain medication from pharmacy prior to appointment. This was $400 out of pocket and she did not get this. Patient is tearful and would like to go home. She is agreeable/appreciative of wheelchair offered. RD took pt to lobby where fiance was waiting.    Nutrition Focused Physical Exam: unable to complete   Medications: reviewed    Labs: 1/10 labs reviewed    Anthropometrics: Per chart review, pt reports unintentional 30 lb wt loss in last 3 months  Height: 5' Weight: 87 lb 4.8 oz (1/10) UBW: 110 lb (l2021 - limited wt history per chart) BMI: 17.05   Estimated Energy Needs  Kcals: 6812-7517 Protein: 59-79 Fluid: >1.3 L   NUTRITION DIAGNOSIS: Unintentional weight loss related to pelvic mass suspected metastatic disease as evidenced by reported poor appetite, early satiety, 30 lb wt loss in the last 3 months per chart review (severe for time frame)   MALNUTRITION DIAGNOSIS: Patient likely meets criteria for severe malnutrition however unable to identify at this time without nutrition history and NFPE   INTERVENTION:  Secure chat with PA-C. Alternate pain medication called in to pharmacy. PA-C Arranged for pt to be seen in [facility] today for IV pain medication + fluids Provided one case Ensure Complete Handout with low fiber foods given Will complete nutrition assessment/education at f/u Contact information given    MONITORING, EVALUATION, GOAL: Patient will tolerate increased calories and protein to minimize further weight loss/promote wt  gain   Next Visit: Will reschedule as able - treatment plan under work-up

## 2022-07-20 NOTE — Progress Notes (Signed)
REFERRING PROVIDER: Lincoln Brigham, PA-C [place],  [facility] [address]  PRIMARY PROVIDER:  Crecencio Mc, MD  PRIMARY REASON FOR VISIT:  1. Pelvic mass      HISTORY OF PRESENT ILLNESS:   Natalie Hinton, a 37 y.o. female, was seen for a [place] cancer genetics consultation at the request of Dr. Charlies Silvers due to a personal history of a pelvic mass.  Natalie Hinton presents to clinic today to discuss the possibility of a hereditary predisposition to cancer, genetic testing, and to further clarify her future cancer risks, as well as potential cancer risks for family members.   In January 2024, at the age of 25, Natalie Hinton was diagnosed with a pelvic mass.  She has not seen any providers yet, but is scheduled with some upcoming visits, so a treatment plan has not been created.     CANCER HISTORY:  Oncology History   No history exists.     RISK FACTORS:  Menarche was at age 80.  First live birth at age 37.  OCP use for approximately 10 years.  Ovaries intact: yes.  Hysterectomy: no.  Menopausal status: premenopausal.  HRT use: 0 years. Colonoscopy: no; not examined. Mammogram within the last year: no. Number of breast biopsies: 0. Up to date with pelvic exams: yes. Any excessive radiation exposure in the past: no  No past medical history on file.  No past surgical history on file.  Social History   Socioeconomic History   Marital status: Single    Spouse name: Not on file   Number of children: Not on file   Years of education: Not on file   Highest education level: Not on file  Occupational History   Not on file  Tobacco Use   Smoking status: Every Day    Packs/day: 0.50    Years: 20.00    Total pack years: 10.00    Types: Cigarettes   Smokeless tobacco: Never  Vaping Use   Vaping Use: Never used  Substance and Sexual Activity   Alcohol use: Never   Drug use: Yes    Types: Marijuana   Sexual activity: Yes    Birth control/protection: None  Other  Topics Concern   Not on file  Social History Narrative   Not on file   Social Determinants of Health   Financial Resource Strain: Not on file  Food Insecurity: Not on file  Transportation Needs: Not on file  Physical Activity: Not on file  Stress: Not on file  Social Connections: Not on file     FAMILY HISTORY:  We obtained a detailed, 4-generation family history.  Significant diagnoses are listed below: Family History  Problem Relation Age of Onset   Lung cancer Maternal Grandfather      The patient has one son who is cancer free.  She has a sister who is cancer free.  Her mother is living and there is no information about her father or his family.  The patient's mother is living at 40.  She has one brother and one sister who are cancer free.  The maternal grandparents are deceased.  Natalie Hinton is unaware of previous family history of genetic testing for hereditary cancer risks. Patient's maternal ancestors are of Caucasian descent, and paternal ancestors are of Native American descent. There is no reported Ashkenazi Jewish ancestry. There is no known consanguinity.  GENETIC COUNSELING ASSESSMENT: Natalie Hinton is a 37 y.o. female with a personal history of a pelvic mass  which is somewhat suggestive of a hereditary cancer syndrome and predisposition to cancer given her young age. We, therefore, discussed and recommended the following at today's visit.   DISCUSSION: We discussed that, in general, most cancer is not inherited in families, but instead is sporadic or familial. Sporadic cancers occur by chance and typically happen at older ages (>50 years) as this type of cancer is caused by genetic changes acquired during an individual's lifetime. Some families have more cancers than would be expected by chance; however, the ages or types of cancer are not consistent with a known genetic mutation or known genetic mutations have been ruled out. This type of familial cancer is thought to be  due to a combination of multiple genetic, environmental, hormonal, and lifestyle factors. While this combination of factors likely increases the risk of cancer, the exact source of this risk is not currently identifiable or testable.  We discussed that up to 20% of ovarian cancer cases are hereditary, with most cases associated with BRCA mutations.  There are other genes that can be associated with hereditary ovarian cancer syndromes.  These include BRIP1, RAD51C/RAD51D and Lynch syndrome..  We discussed that testing is beneficial for several reasons including knowing how to follow individuals after completing their treatment, identifying whether potential treatment options such as PARP inhibitors would be beneficial, and understand if other family members could be at risk for cancer and allow them to undergo genetic testing.   We reviewed the characteristics, features and inheritance patterns of hereditary cancer syndromes. We also discussed genetic testing, including the appropriate family members to test, the process of testing, insurance coverage and turn-around-time for results. We discussed the implications of a negative, positive, carrier and/or variant of uncertain significant result. Natalie Hinton  was offered a common hereditary cancer panel (47 genes) and an expanded pan-cancer panel (77 genes). Natalie Hinton was informed of the benefits and limitations of each panel, including that expanded pan-cancer panels contain genes that do not have clear management guidelines at this point in time.  We also discussed that as the number of genes included on a panel increases, the chances of variants of uncertain significance increases. Natalie Hinton decided to pursue genetic testing for the CancerNext-Expanded+RNAinsight gene panel.   The CustomNext-Cancer gene panel offered by [facility] and includes sequencing and rearrangement analysis for the following 91 genes: AIP, ALK, APC*, ATM*, AXIN2, BAP1, BARD1,  BLM, BMPR1A, BRCA1*, BRCA2*, BRIP1*, CDC73, CDH1*, CDK4, CDKN1B, CDKN2A, CHEK2*, CTNNA1, DICER1, FANCC, FH, FLCN, GALNT12, KIF1B, LZTR1, MAX, MEN1, MET, MLH1*, MRE11A, MSH2*, MSH3, MSH6*, MUTYH*, NBN, NF1*, NF2, NTHL1, PALB2*, PHOX2B, PMS2*, POT1, PRKAR1A, PTCH1, PTEN*, RAD50, RAD51C*, RAD51D*, RB1, RECQL, RET, SDHA, SDHAF2, SDHB, SDHC, SDHD, SMAD4, SMARCA4, SMARCB1, SMARCE1, STK11, SUFU, TMEM127, TP53*, TSC1, TSC2, VHL and XRCC2 (sequencing and deletion/duplication); CASR, CFTR, CPA1, CTRC, EGFR, EGLN1, FAM175A, HOXB13, KIT, MITF, MLH3, PALLD, PDGFRA, POLD1, POLE, PRSS1, RINT1, RPS20, SPINK1 and TERT (sequencing only); EPCAM and GREM1 (deletion/duplication only). DNA and RNA analyses performed for * genes.  Based on Natalie Hinton personal history of cancer, she meets medical criteria for genetic testing. Despite that she meets criteria, she may still have an out of pocket cost. We discussed that if her out of pocket cost for testing is over $100, the laboratory will call and confirm whether she wants to proceed with testing.  If the out of pocket cost of testing is less than $100 she will be billed by the genetic testing laboratory.   We discussed that some  people do not want to undergo genetic testing due to fear of genetic discrimination.  A federal law called the Genetic Information Non-Discrimination Act (GINA) of 2008 helps protect individuals against genetic discrimination based on their genetic test results.  It impacts both health insurance and employment.  With health insurance, it protects against increased premiums, being kicked off insurance or being forced to take a test in order to be insured.  For employment it protects against hiring, firing and promoting decisions based on genetic test results.  GINA does not apply to those in the [employer], those who work for companies with less than 15 employees, and new life insurance or long-term disability insurance policies.  Health status due to a cancer  diagnosis is not protected under GINA.  PLAN: After considering the risks, benefits, and limitations, Natalie Hinton provided informed consent to pursue genetic testing and the blood sample was sent to [facility] for analysis of the CancerNext-Expanded+RNAinsight. Results should be available within approximately 2-3 weeks' time, at which point they will be disclosed by telephone to Natalie Hinton, as will any additional recommendations warranted by these results. Natalie Hinton will receive a summary of her genetic counseling visit and a copy of her results once available. This information will also be available in Epic.   Lastly, we encouraged Natalie Hinton to remain in contact with cancer genetics annually so that we can continuously update the family history and inform her of any changes in cancer genetics and testing that may be of benefit for this family.   Natalie Hinton questions were answered to her satisfaction today. Our contact information was provided should additional questions or concerns arise. Thank you for the referral and allowing [place] to share in the care of your patient.   Natalie Hinton P. Florene Glen, [place], [facility] Licensed, Insurance risk surveyor [email or web address] phone: [phone]  The patient was seen for a total of 40 minutes in face-to-face genetic counseling.  The patient was seen alone.  Drs. Michell Heinrich, and/or [place] were available for questions, if needed..    _______________________________________________________________________ For Office Staff:  Number of people involved in session: 1 Was an Intern/ student involved with case: no

## 2022-07-21 ENCOUNTER — Encounter: Payer: Self-pay | Admitting: Family Medicine

## 2022-07-21 ENCOUNTER — Other Ambulatory Visit: Payer: Self-pay | Admitting: Physician Assistant

## 2022-07-21 ENCOUNTER — Ambulatory Visit (HOSPITAL_COMMUNITY)
Admission: RE | Admit: 2022-07-21 | Discharge: 2022-07-21 | Disposition: A | Discharge status: Home / Self Care | Payer: Medicaid Other | Source: Ambulatory Visit | Attending: Physician Assistant | Admitting: Physician Assistant

## 2022-07-21 ENCOUNTER — Ambulatory Visit (INDEPENDENT_AMBULATORY_CARE_PROVIDER_SITE_OTHER): Payer: BLUE CROSS/BLUE SHIELD | Admitting: Family Medicine

## 2022-07-21 VITALS — BP 130/100 | HR 110 | Temp 99.2°F | Ht 60.0 in | Wt 83.1 lb

## 2022-07-21 DIAGNOSIS — R030 Elevated blood-pressure reading, without diagnosis of hypertension: Secondary | ICD-10-CM | POA: Diagnosis not present

## 2022-07-21 DIAGNOSIS — R1900 Intra-abdominal and pelvic swelling, mass and lump, unspecified site: Secondary | ICD-10-CM

## 2022-07-21 DIAGNOSIS — M899 Disorder of bone, unspecified: Secondary | ICD-10-CM

## 2022-07-21 DIAGNOSIS — C799 Secondary malignant neoplasm of unspecified site: Secondary | ICD-10-CM | POA: Insufficient documentation

## 2022-07-21 DIAGNOSIS — R634 Abnormal weight loss: Secondary | ICD-10-CM

## 2022-07-21 DIAGNOSIS — C775 Secondary and unspecified malignant neoplasm of intrapelvic lymph nodes: Secondary | ICD-10-CM | POA: Diagnosis not present

## 2022-07-21 MED ORDER — LIDOCAINE HCL (PF) 1 % IJ SOLN
INTRAMUSCULAR | Status: AC
  Filled 2022-07-21: qty 30

## 2022-07-21 MED ORDER — LIDOCAINE HCL (PF) 1 % IJ SOLN: 15.0000 mL | Freq: Once | INTRAMUSCULAR | Status: DC

## 2022-07-21 MED ORDER — NALOXONE HCL 4 MG/0.1ML NA LIQD: 1.0000 | Freq: Once | NASAL | 1 refills | Status: AC

## 2022-07-21 NOTE — Procedures (Signed)
Interventional Radiology Procedure Note  Procedure:  1) Ultrasound guided pelvic cyst aspiration 2) Ultrasound guided right cervical mass biopsy  Findings: Please refer to procedural dictation for full description. 900 mL translucent, lightly straw colored fluid from pelvic collection with near complete resolution, fluid sent for cytology.  18 ga core x2 from right cervical mass, samples placed in formalin.  Complications: None immediate  Estimated Blood Loss: < 5 mL  Recommendations: Follow up cytology and pathology.   Ruthann Cancer, MD

## 2022-07-21 NOTE — Patient Instructions (Addendum)
Welcome to [facility]! It was a pleasure meeting you today.  As discussed, Please schedule a prn month follow up visit today.  Bioteen for dry mouth Check blood pressure daily for now-different times.   PLEASE NOTE:  If you had any LAB tests please let us know if you have not heard back within a few days. You may see your results on MyChart before we have a chance to review them but we will give you a call once they are reviewed by [place]. If we ordered any REFERRALS today, please let us know if you have not heard from their office within the next week.  Let us know through MyChart if you are needing REFILLS, or have your pharmacy send [place] the request. You can also use MyChart to communicate with me or any office staff.  Please try these tips to maintain a healthy lifestyle:  Eat most of your calories during the day when you are active. Eliminate processed foods including packaged sweets (pies, cakes, cookies), reduce intake of potatoes, white bread, white pasta, and white rice. Look for whole grain options, oat flour or almond flour.  Each meal should contain half fruits/vegetables, one quarter protein, and one quarter carbs (no bigger than a computer mouse).  Cut down on sweet beverages. This includes juice, soda, and sweet tea. Also watch fruit intake, though this is a healthier sweet option, it still contains natural sugar! Limit to 3 servings daily.  Drink at least 1 glass of water with each meal and aim for at least 8 glasses per day  Exercise at least 150 minutes every week.

## 2022-07-21 NOTE — Progress Notes (Signed)
New Patient Office Visit  Subjective:  Patient ID: Natalie Hinton, female    DOB: 1986/06/03  Age: 37 y.o. MRN: [id number]  CC:  Chief Complaint  Patient presents with   Establish Care    Need new pcp Pain due cancer, need PA for meds      HPI-here w/cousin [name] Lakita M Folkes presents for new pt. Meds Went to ER for pain in abd/groin-had fallen about 3-4 wks ago after drinking moonshine.  Fell onto concrete ground. +LOC for 2-36mn or more-was by herself.  Pain B groin.  Went to RE on 1/3 and found pelvic mass, mult mets.  W/u in process.  Had bx today pelvic cyst and R cervical lymph node Has been losing wt past 2 mo-40# in 1 mo.    Irreg menses long time-monthly till Oct 2023-got worse. No menses since. U preg neg 07/07/22 Pain 20/10. Groin rad down both legs and back over boney areas. No h/o pain.  Taking advil-not working.  Got rx from onc for hydrocodone and $400 so changed to oxycodone and waiting on PA for it.  In a lot of pain and thinks bp elevated d/t pain Daily HA for 2 wks-constant-mostly behind R eye.  No visual change.  +photo.  Some nausea occ. CT neg Cough and wheeze and some DOE-ER-mult mets.  No f/c   Past Medical History:  Diagnosis Date   Thyroid disease     History reviewed. No pertinent surgical history.  Family History  Problem Relation Age of Onset   Cancer Maternal Grandfather 653-- 11      lung   Lung cancer Maternal Grandfather     Social History   Socioeconomic History   Marital status: Widowed    Spouse name: Not on file   Number of children: 1   Years of education: Not on file   Highest education level: Not on file  Occupational History   Not on file  Tobacco Use   Smoking status: Every Day    Packs/day: 0.50    Years: 20.00    Total pack years: 10.00    Types: Cigarettes   Smokeless tobacco: Never  Vaping Use   Vaping Use: Never used  Substance and Sexual Activity   Alcohol use: Never    Comment: occ   Drug use: Yes     Types: Marijuana   Sexual activity: Yes    Birth control/protection: None  Other Topics Concern   Not on file  Social History Narrative   1 son live w/gma      Unem.   Social Determinants of Health   Financial Resource Strain: Not on file  Food Insecurity: Not on file  Transportation Needs: Not on file  Physical Activity: Not on file  Stress: Not on file  Social Connections: Not on file  Intimate Partner Violence: Not on file    ROS  ROS: Gen: no fever, chills  Skin: no rash, itching ENT: no ear pain, ear drainage, nasal congestion, rhinorrhea, sinus pressure, sore throat Eyes: no blurry vision, double vision Resp: HPI CV: no CP, palpitations, LE edema,  GI: no heartburn. +ab pain GU: no dysuria, urgency, frequency, hematuria MSK: no joint pain, myalgias, back pain Neuro: no dizziness, weakness, vertigo Psych: no depression, anxiety, insomnia, SI   Objective:   Today's Vitals: BP (!) 130/100 (BP Location: Left Arm, Patient Position: Sitting, Cuff Size: Small)   Pulse (!) 110   Temp 99.2 F (37.3 C) (Temporal)  Ht 5' (1.524 m)   Wt 83 lb 2 oz (37.7 kg)   LMP 04/04/2022 (Approximate)   SpO2 96%   BMI 16.23 kg/m   Physical Exam  Gen: WDWN NAD thin/cachectic wf in pain.  Looks older.  HEENT: NCAT, conjunctiva not injected, sclera nonicteric OP dry no exudates, poor dentition  NECK:  supple, +mass on R near supraclavicular area CARDIAC: tachy RRR, S1S2+, no murmur.  LUNGS: CTAB. No wheezes EXT:  no edema MSK: no gross abnormalities.  Clubbing of fingers NEURO: A&O x3.  CN II-XII intact.  PSYCH: normal mood. Good eye contact   I had reviewed patient's chart for 10 minutes prior to arrival.  I spent 5 minutes discussing care with oncology due to issues with pain meds.  Assessment & Plan:   Problem List Items Addressed This Visit       Other   Pelvic mass - Primary   Other Visit Diagnoses     Elevated blood pressure reading         1.  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  Just had biopsies today.  Waiting on records for further plan.  Major issue today is pain management.  I discussed with oncology and 10 minutes prior, it was approved.  Patient was relieved to hear she could go to the pharmacy and pick it up.  We discussed keeping records of when she takes her pain meds as they may make her loopy and confused.  Do not want her to accidentally take too many.  We discussed Narcan.  Patient's that she is not at risk for overdose, however discussed others getting a hold of medicine etc.  Patient was amenable to Narcan.  This was sent to the pharmacy.  It is a problem, then she will pick it up. 2.  Elevated blood pressure/tachycardia-patient thinks her blood pressure is elevated due to the pain.  She does have a blood pressure cuff at home.  Advised to monitor blood pressures once pain is more manageable.  Also, tachycardia-which may be related to pain/dehydration/other.  We left follow-up open as patient will be undergoing multiple tests and treatment.  Being part of her care will be per oncology.  We are in the background and available if needed.  Outpatient Encounter Medications as of 07/21/2022  Medication Sig   naloxone (NARCAN) nasal spray 4 mg/0.1 mL Place 1 spray into the nose once for 1 dose.   ondansetron (ZOFRAN) 8 MG tablet Take 1 tablet (8 mg total) by mouth every 8 (eight) hours as needed for nausea or vomiting. (Patient not taking: Reported on 07/21/2022)   oxyCODONE 10 MG TABS Take 0.5 tablets (5 mg total) by mouth every 4 (four) hours as needed for severe pain. (Patient not taking: Reported on 07/21/2022)   Facility-Administered Encounter Medications as of 07/21/2022  Medication   lidocaine (PF) (XYLOCAINE) 1 % injection 15 mL   lidocaine (PF) (XYLOCAINE) 1 % injection    Follow-up: Return if symptoms worsen or fail to improve.   Wellington Hampshire, MD

## 2022-07-22 ENCOUNTER — Encounter: Payer: Self-pay | Admitting: Psychiatry

## 2022-07-26 ENCOUNTER — Encounter: Payer: Self-pay | Admitting: Psychiatry

## 2022-07-26 ENCOUNTER — Inpatient Hospital Stay: Payer: Medicaid Other

## 2022-07-26 ENCOUNTER — Other Ambulatory Visit: Payer: Self-pay

## 2022-07-26 ENCOUNTER — Inpatient Hospital Stay (HOSPITAL_BASED_OUTPATIENT_CLINIC_OR_DEPARTMENT_OTHER): Payer: Medicaid Other | Admitting: Psychiatry

## 2022-07-26 VITALS — BP 143/83 | HR 110 | Temp 98.3°F | Resp 16 | Wt 85.0 lb

## 2022-07-26 DIAGNOSIS — C7951 Secondary malignant neoplasm of bone: Secondary | ICD-10-CM

## 2022-07-26 DIAGNOSIS — C778 Secondary and unspecified malignant neoplasm of lymph nodes of multiple regions: Secondary | ICD-10-CM | POA: Diagnosis not present

## 2022-07-26 DIAGNOSIS — M792 Neuralgia and neuritis, unspecified: Secondary | ICD-10-CM

## 2022-07-26 DIAGNOSIS — R1900 Intra-abdominal and pelvic swelling, mass and lump, unspecified site: Secondary | ICD-10-CM

## 2022-07-26 DIAGNOSIS — G893 Neoplasm related pain (acute) (chronic): Secondary | ICD-10-CM

## 2022-07-26 DIAGNOSIS — C7801 Secondary malignant neoplasm of right lung: Secondary | ICD-10-CM

## 2022-07-26 DIAGNOSIS — N63 Unspecified lump in unspecified breast: Secondary | ICD-10-CM

## 2022-07-26 DIAGNOSIS — C7800 Secondary malignant neoplasm of unspecified lung: Secondary | ICD-10-CM

## 2022-07-26 LAB — LACTATE DEHYDROGENASE: LDH: 798 U/L — ABNORMAL HIGH (ref 98–192)

## 2022-07-26 LAB — CEA (ACCESS): CEA (CHCC): 1437.75 ng/mL — ABNORMAL HIGH (ref 0.00–5.00)

## 2022-07-26 NOTE — Progress Notes (Signed)
GYNECOLOGIC ONCOLOGY NEW PATIENT CONSULTATION  Date of Service: 07/26/2022 Referring Provider: Dede Query PA-C   ASSESSMENT AND PLAN: Natalie Hinton is a 37 y.o. woman with complex pelvis masses and imaging concerning for metastatic disease with innumerable pulmonary nodules, mediastinal, thoracic and cervical adenopathy and destructive osseous pubic ramus lesion.   Reviewed with the patient in detail her imaging findings.  Also reviewed her normal CA125.  Pathology pending from biopsies although verbal report from pathology that carcinoma is identified on cervical node core biopsy, but with additional stains pending to help delineate origin of disease.  Reviewed that her pelvic mass could represent the primary site of origin of her cancer or could be a metastatic site of a different cancer.  We will await pathology results to help clarify this.  Otherwise, based on exam patient's pelvic pain seems to originate at the pubic ramus where she has been found on imaging to have a destructive bone lesion with possible extension into adjacent soft tissue.  The radiating pain may be neuropathic in origin as nerves to the medial thigh run adjacent to the pubis including the obturator nerve and the genital branch of the gen/fem nerve.  Given this, would recommend consideration of neuropathic pain medication in addition to her narcotic pain medication, like consideration of gabapentin added to her regimen.  Discussed with patient that at this time I may defer to Dede Query PA-C for prescription so that her medications are coming from one location also considering that her final pathology is still pending.  Also reviewed that in cases like this, sometimes we will refer to palliative care for additional help and symptom management.  Globally, reviewed that surgery would not be the initial recommended treatment for this patient.  Do not feel that her pelvic masses are the main source of her pain and instead is  primarily from the osseous lesion on her pubic bone.  Systemic treatment and possible radiation treatment would be recommended to both help control disease as well as aid in symptom management.  Have ordered additional tumor markers to be collected today given that her CA125 was normal, including germ cell tumor markers. UPT in ED was negative. Will follow-up results of tumor markers and pathology to discuss further with patient if pathology returns with gynecologic origin.  A copy of this note was sent to the patient's referring provider.  Bernadene Bell, MD Gynecologic Oncology   Medical Decision Making I personally spent  TOTAL 60 minutes face-to-face and non-face-to-face in the care of this patient, which includes all pre, intra, and post visit time on the date of service.  -----------  CC: Metastatic cancer, pelvic masses  HISTORY OF PRESENT ILLNESS:  Natalie Hinton is a 37 y.o. woman who is seen in consultation at the request of Dede Query PA-C for evaluation of pelvic masses in the setting of imaging concerning for metastatic cancer.  On 07/07/22 the patient presented to the ED for concern of cough, swelling of the right neck and pelvic pain.  She was noted on CT neck to have centrally necrotic lymph node.  She also underwent a CT chest/abdomen/pelvis (07/07/22) which showed metastatic disease involve pulmonary nodules, mediastinal adenopathy, left pubic ramus osseous lesion. She was also noted to have a 14 x 10.8 x 11.3cm cystic pelvic mass, possibly arising from the right ovary and a 5.6 x 4.9cm heterogenous mass in the posterior cul-de-sac.   She was seen by Dede Query PA-C on 07/14/2022 at which time she recommended  biopsy for diagnosis.  He was also referred to genetics given young age. A CA125 on 07/14/22 was normal at 24.6. She underwent biopsies including pelvic cyst aspiration and right cervical lymph node core biopsy on 07/21/22.   Patient has also been seen by genetics and  nutrition on 1/16 with genetic testing collected and pending.   Today, patient reports that she has been experiencing decreased appetite, bloating, early satiety, pain, and a weight loss of 25 pounds over the last 3 to 4 months.  She reports that her pelvic pain overlies her pubic bone and radiates down bilateral medial thighs to her knees.  She describes the pain as sharp and stabbing and lightening bolt in nature.  She reports that wiping after voiding exacerbates the pain.  Heating pad and warm bath helps the pain.  She also notes lower back pain particularly over a bony prominence in her lower back which is exacerbated by laying flat on her back.  Up until October she was having regular monthly periods but has not had a period since October.  She reports that she has tried to start ensures as recommended by nutrition but does not like vanilla.  She thinks she would do better with chocolate.  She reports a possible breast lump or nodularity for several years on her right breast that has not been assessed.  She is taking oxycodone for her pain which helps some but does not completely cover the pain.  She is taking 10 mg every 4 hours.   PAST MEDICAL HISTORY: Past Medical History:  Diagnosis Date   Thyroid disease     PAST SURGICAL HISTORY: History reviewed. No pertinent surgical history.  OB/GYN HISTORY: OB History  Gravida Para Term Preterm AB Living  '2 1 1   1 1  '$ SAB IAB Ectopic Multiple Live Births    1     1    # Outcome Date GA Lbr Len/2nd Weight Sex Delivery Anes PTL Lv  2 IAB           1 Term      Vag-Spont   LIV      Age at menarche: 89 Age at menopause: N/A Hx of HRT: N/A Hx of STI: None Last pap: 2020, normal per pt report History of abnormal pap smears: none that she is aware of  SCREENING STUDIES:  Last mammogram: None Last colonoscopy: None  MEDICATIONS:  Current Outpatient Medications:    oxyCODONE 10 MG TABS, Take 0.5 tablets (5 mg total) by mouth every 4  (four) hours as needed for severe pain., Disp: 90 tablet, Rfl: 0   ondansetron (ZOFRAN) 8 MG tablet, Take 1 tablet (8 mg total) by mouth every 8 (eight) hours as needed for nausea or vomiting. (Patient not taking: Reported on 07/21/2022), Disp: 90 tablet, Rfl: 0  ALLERGIES: Allergies  Allergen Reactions   Penicillins     FAMILY HISTORY: Family History  Problem Relation Age of Onset   Lung cancer Maternal Grandfather 67 - 69   Breast cancer Neg Hx    Ovarian cancer Neg Hx    Endometrial cancer Neg Hx    Prostate cancer Neg Hx    Pancreatic cancer Neg Hx     SOCIAL HISTORY: Social History   Socioeconomic History   Marital status: Widowed    Spouse name: Not on file   Number of children: 1   Years of education: Not on file   Highest education level: Not on file  Occupational History  Not on file  Tobacco Use   Smoking status: Every Day    Packs/day: 0.50    Years: 20.00    Total pack years: 10.00    Types: Cigarettes    Start date: 2004   Smokeless tobacco: Never  Vaping Use   Vaping Use: Never used  Substance and Sexual Activity   Alcohol use: Never   Drug use: Yes    Types: Marijuana   Sexual activity: Yes    Partners: Male    Birth control/protection: None  Other Topics Concern   Not on file  Social History Narrative   1 son live w/gma      Unem.   Social Determinants of Health   Financial Resource Strain: Not on file  Food Insecurity: Not on file  Transportation Needs: Not on file  Physical Activity: Not on file  Stress: Not on file  Social Connections: Not on file  Intimate Partner Violence: Not on file    REVIEW OF SYSTEMS: New patient intake form was reviewed.  Complete 10-system review is negative except for the following: Voice change, shortness of breath, constipation, joint pain, rash, migraine, easy bruising/bleeding, lump in neck, pelvic pain, back pain, anxiety, fatigue, mouth sores, chest pain, abdominal pain, depression,  cough  PHYSICAL EXAM: BP (!) 143/83 (BP Location: Right Arm, Patient Position: Sitting)   Pulse (!) 110   Temp 98.3 F (36.8 C) (Oral)   Resp 16   Wt 85 lb (38.6 kg)   LMP 04/04/2022 (Approximate)   SpO2 100%   BMI 16.60 kg/m  Constitutional: Appears mildly uncomfortable sitting towards one side. Neuro/Psych: Alert, oriented.  Head and Neck: Normocephalic, atraumatic. Neck symmetric without masses. Sclera anicteric.  Breast: Normal skin and nipples bilaterally. Slight nodularity of the right breast near the areola at approximately 10-11 o'clock. No axillary adenopathy bilaterally.  Respiratory: Normal work of breathing.  Mild rhonchi in bilateral lung bases. Cardiovascular: Sinus tachycardia, no murmurs, rubs, or gallops. Abdomen: Normoactive bowel sounds. Soft, non-distended, non-tender to palpation.  Mass palpated in the lower abdomen. No evidence of hernia. No palpable fluid wave.  Extremities: Grossly normal range of motion. Warm, well perfused. No edema bilaterally. Skin: No rashes or lesions. Skeletal: Moderate TTP with gentle touch of the pubic bone. TTP of the coccyx bone. Lymphatic: No supraclavicular adenopathy. Enlarged and mildly tender right cervical lymph node. Shotty bilateral inguinal lymphadenopathy. Bilateral TTP of the groins.  Genitourinary: External genitalia without lesions. Urethral meatus without lesions or prolapse. On speculum exam, vagina and cervix without lesions. Think clear/white discharge. Bimanual exam reveals normal cervix. Uterus difficult to palpate discrete from pelvic mass. Minimally mobile pelvic mass that fills the lower pelvis. Also with fullness in the posterior cul-de-sac. Rectovaginal exam confirms the above findings and reveals normal sphincter tone and possible posterior cul-de-sac nodularity. Stool in rectum. Exam chaperoned by Joylene John, NP   LABORATORY AND RADIOLOGIC DATA: Outside medical records were reviewed to synthesize the above  history, along with the history and physical obtained during the visit.  Outside laboratory, pathology, and imaging reports were reviewed, with pertinent results below.  I personally reviewed the outside images.  WBC  Date Value Ref Range Status  07/07/2022 15.7 (H) 4.0 - 10.5 K/uL Final   WBC Count  Date Value Ref Range Status  07/14/2022 16.5 (H) 4.0 - 10.5 K/uL Final   Hemoglobin  Date Value Ref Range Status  07/14/2022 14.8 12.0 - 15.0 g/dL Final   HCT  Date Value Ref Range  Status  07/14/2022 45.1 36.0 - 46.0 % Final   Platelet Count  Date Value Ref Range Status  07/14/2022 405 (H) 150 - 400 K/uL Final   Creatinine  Date Value Ref Range Status  07/14/2022 0.49 0.44 - 1.00 mg/dL Final   AST  Date Value Ref Range Status  07/14/2022 25 15 - 41 U/L Final   ALT  Date Value Ref Range Status  07/14/2022 17 0 - 44 U/L Final   Lab Results  Component Value Date/Time   VVO160 24.6 07/14/2022 02:20 PM    CT CHEST ABDOMEN PELVIS W CONTRAST 07/07/2022  Narrative CLINICAL DATA:  Metastatic disease evaluation, occult malignancy. Back pain, knee pain, shoulder pain and headache. This area. Patient reports weight loss, night sweats, body pain and bone pain.  EXAM: CT CHEST, ABDOMEN, AND PELVIS WITH CONTRAST  TECHNIQUE: Multidetector CT imaging of the chest, abdomen and pelvis was performed following the standard protocol during bolus administration of intravenous contrast.  RADIATION DOSE REDUCTION: This exam was performed according to the departmental dose-optimization program which includes automated exposure control, adjustment of the mA and/or kV according to patient size and/or use of iterative reconstruction technique.  CONTRAST:  51m OMNIPAQUE IOHEXOL 350 MG/ML SOLN  COMPARISON:  Noncontrast chest CT earlier today.  FINDINGS: CT CHEST FINDINGS  Cardiovascular: Normal caliber thoracic aorta. No central pulmonary embolus on this exam not tailored to  pulmonary artery assessment. No pericardial effusion or thickening.  Mediastinum/Nodes: 2.6 cm lesion with central hypodensity in the anterior right paratracheal station is likely necrotic node series 3, image 19. Presumed necrotic right thoracic inlet node measuring 2.6 cm series 3, image 7. there additional necrotic nodes in the highest mediastinum. Ill-defined right hilar soft tissue density likely represents adenopathy, series 3, image 26. There is also a 10 mm right hilar node series 3, image 28. No esophageal wall thickening. No discrete thyroid nodule. No axillary adenopathy.  Lungs/Pleura: Mild apical emphysema or blebs. Numeral wall bilateral pulmonary nodules within both lungs typical of metastatic disease. Index lesion in the right lower lobe measures 19 x 16 mm, series 5, image 128. No pleural effusion or significant pleural thickening. Minimal retained mucus within the central bronchi. There is slight narrowing of the right upper lobe bronchus due to mediastinal adenopathy. Mild right lower lobe bronchiectasis with occasional mucoid impaction.  Musculoskeletal: No obvious destructive lytic or blastic osseous lesion. No dominant breast mass. Generalized paucity of body fat.  CT ABDOMEN PELVIS FINDINGS  Hepatobiliary: Water density 12 mm lesion in the right hepatic dome series 3, image 51, typically cyst. No evidence of enhancing liver lesion. Gallbladder physiologically distended, no calcified stone. No biliary dilatation.  Pancreas: Suboptimally assessed due to lack of enteric contrast and paucity of intra-abdominal fat. There is no obvious pancreatic mass. No ductal dilatation or inflammation.  Spleen: Normal in size. Punctate calcified granuloma. No splenic lesion.  Adrenals/Urinary Tract: No adrenal nodule. Dilatation of both renal pelvis, left greater than right. No perinephric edema. Intermittent ureteral dilatation. The urinary bladder is slightly displaced  into the left pelvis by pelvic mass. No bladder wall thickening. No suspicious renal abnormality. No definite renal calculi, although excreted contrast in the renal collecting systems obscured assessment.  Stomach/Bowel: Bowel assessment is limited in the absence of enteric contrast and paucity of intra-abdominal fat. Fluid/ingested material within the stomach. No bowel dilatation or evidence of obstruction moderate volume of colonic stool within the ascending, transverse and descending colon. Sigmoid colon is mildly redundant and displaced  by pelvic mass.  Vascular/Lymphatic: Patent portal, splenic and mesenteric veins. Normal caliber abdominal aorta. Limited assessment for adenopathy in the absence of enteric contrast and paucity of intra-abdominal fat. There is no convincing enlarged lymph nodes in the abdomen or pelvis.  Reproductive: Large midline cystic pelvic mass measures 14 x 10.8 x 11.3 cm, series 3, image 100. Extending superiorly from this on the right is a dilated fluid-filled tubular structure that may represent hydrosalpinx query separate cystic component. This may arise from the right ovary which is not discretely visualized. No convincing solid components. The left ovary is also not discretely visualized. The uterus is deviated into the right pelvis. Posterior to the uterus in the posterior pelvic midline the heterogeneous lobulated heterogeneously enhancing lesion measuring 5.6 x 4.9 cm, series 3, image 104, possible connection to the lower aspect of the uterus but not definite. There may be heterogeneity of the lower cervix and vagina.  Other: Suspect minimal free fluid in the right pelvis. No upper abdominal ascites or free fluid. No convincing omental thickening, although paucity of body fat limits assessment.  Musculoskeletal: Destructive lesion involving the left superior pubic ramus approaches the pubic body with cortical destruction, series 3, image 116.  there is an extraosseous soft tissue component extending into the adjacent obturator musculature. No definite additional bone lesion.  IMPRESSION: 1. Findings typical of metastatic disease in the thorax with innumerable pulmonary nodules, necrotic mediastinal and right thoracic inlet adenopathy. 2. Cystic mass in the pelvic midline, given size, suspicious for neoplasm. There is also heterogeneous lesion posterior to the uterus, unclear if this represents a primary lesion, metastatic deposit or arises from the uterus has a pedunculated lesion. 3. Destructive bone lesion in the left superior pubic ramus typical of metastatic disease. No definite additional osseous metastatic disease seen by CT, however PET scan or bone scan may be helpful for a complete osseous assessment. 4. Site of primary neoplasm is not confidently confirmed, although given size of cystic pelvic mass and adjacent enhancing structure, GYN neoplasm is considered. Recommend oncologic referral for consideration of multi disciplinary assessment. Tissue sampling may be helpful. The right thoracic inlet node may be an accessible site for biopsy.   Electronically Signed By: Keith Rake M.D. On: 07/07/2022 18:27   CT Head W or Wo Contrast 07/07/2022  Narrative CLINICAL DATA:  Metastatic disease evaluation; multiple lung nodules  EXAM: CT HEAD WITHOUT AND WITH CONTRAST  CT NECK WITH CONTRAST  TECHNIQUE: Contiguous axial images were obtained from the base of the skull through the vertex without and with intravenous contrast. Multidetector CT imaging of the and neck was performed using the standard protocol following the bolus administration of intravenous contrast.  RADIATION DOSE REDUCTION: This exam was performed according to the departmental dose-optimization program which includes automated exposure control, adjustment of the mA and/or kV according to patient size and/or use of iterative  reconstruction technique.  CONTRAST:  75m OMNIPAQUE IOHEXOL 350 MG/ML SOLN  COMPARISON:  CT chest 07/07/2022; no prior head or neck CT  FINDINGS: CT HEAD FINDINGS  Brain: No evidence of acute infarct, hemorrhage, mass, mass effect, or midline shift. No hydrocephalus or extra-axial fluid collection. No abnormal enhancement.  Vascular: No hyperdense vessel. Normal arterial and venous enhancement.  Skull: Normal. Negative for fracture or focal lesion.  Sinuses/Orbits: Mucosal thickening in the right sphenoid sinus. Otherwise clear paranasal sinuses. No acute finding in the orbits.  Other: The mastoid air cells are well aerated.  CT NECK FINDINGS  Pharynx and  larynx: Normal. No mass or swelling.  Salivary glands: No inflammation, mass, or stone.  Thyroid: Hypoenhancing nodule in the right thyroid lobe, which measures up to 7 mm (series 9, image 76), for which no follow-up is currently indicated. (Reference: J Am Coll Radiol. 2015 Feb;12(2): 143-50)  Lymph nodes: Centrally necrotic right level 4 lymph node measures 2.8 x 2.5 x 3.6 cm (AP x TR x CC) (series 9, image 79 and series 8, image 62). Prominent adjacent right level 4 lymph node (series 9, image 73), which is not significantly enlarged or centrally necrotic. No other abnormal cervical lymph nodes. Please see same-day CT chest report for subclavicular lymph nodes.  Vascular: Patent.  Skeleton: No acute osseous abnormality. Edentulous.  Upper chest: Please see same-day CT chest  Other: Somewhat asymmetric enlargement of the right sternocleidomastoid muscle, without focal abnormal enhancement or collection, which may be reactive to the aforementioned right level 4 necrotic lymph node.  IMPRESSION: 1. Centrally necrotic right level 4 lymph node, consistent with metastatic disease. Please see same-day CT chest report for additional subclavicular lymph nodes. 2. No acute intracranial process. 3. Somewhat  asymmetric enlargement of the right sternocleidomastoid muscle, without focal abnormal enhancement or collection, which may be reactive. 4. No evidence of metastatic disease in the brain or skull base. 5. No additional acute process in the neck.   Electronically Signed By: Merilyn Baba M.D. On: 07/07/2022 18:14   CT Soft Tissue Neck W Contrast 07/07/2022  Narrative CLINICAL DATA:  Metastatic disease evaluation; multiple lung nodules  EXAM: CT HEAD WITHOUT AND WITH CONTRAST  CT NECK WITH CONTRAST  TECHNIQUE: Contiguous axial images were obtained from the base of the skull through the vertex without and with intravenous contrast. Multidetector CT imaging of the and neck was performed using the standard protocol following the bolus administration of intravenous contrast.  RADIATION DOSE REDUCTION: This exam was performed according to the departmental dose-optimization program which includes automated exposure control, adjustment of the mA and/or kV according to patient size and/or use of iterative reconstruction technique.  CONTRAST:  88m OMNIPAQUE IOHEXOL 350 MG/ML SOLN  COMPARISON:  CT chest 07/07/2022; no prior head or neck CT  FINDINGS: CT HEAD FINDINGS  Brain: No evidence of acute infarct, hemorrhage, mass, mass effect, or midline shift. No hydrocephalus or extra-axial fluid collection. No abnormal enhancement.  Vascular: No hyperdense vessel. Normal arterial and venous enhancement.  Skull: Normal. Negative for fracture or focal lesion.  Sinuses/Orbits: Mucosal thickening in the right sphenoid sinus. Otherwise clear paranasal sinuses. No acute finding in the orbits.  Other: The mastoid air cells are well aerated.  CT NECK FINDINGS  Pharynx and larynx: Normal. No mass or swelling.  Salivary glands: No inflammation, mass, or stone.  Thyroid: Hypoenhancing nodule in the right thyroid lobe, which measures up to 7 mm (series 9, image 76), for which no  follow-up is currently indicated. (Reference: J Am Coll Radiol. 2015 Feb;12(2): 143-50)  Lymph nodes: Centrally necrotic right level 4 lymph node measures 2.8 x 2.5 x 3.6 cm (AP x TR x CC) (series 9, image 79 and series 8, image 62). Prominent adjacent right level 4 lymph node (series 9, image 73), which is not significantly enlarged or centrally necrotic. No other abnormal cervical lymph nodes. Please see same-day CT chest report for subclavicular lymph nodes.  Vascular: Patent.  Skeleton: No acute osseous abnormality. Edentulous.  Upper chest: Please see same-day CT chest  Other: Somewhat asymmetric enlargement of the right sternocleidomastoid muscle, without focal  abnormal enhancement or collection, which may be reactive to the aforementioned right level 4 necrotic lymph node.  IMPRESSION: 1. Centrally necrotic right level 4 lymph node, consistent with metastatic disease. Please see same-day CT chest report for additional subclavicular lymph nodes. 2. No acute intracranial process. 3. Somewhat asymmetric enlargement of the right sternocleidomastoid muscle, without focal abnormal enhancement or collection, which may be reactive. 4. No evidence of metastatic disease in the brain or skull base. 5. No additional acute process in the neck.   Electronically Signed By: Merilyn Baba M.D. On: 07/07/2022 18:14

## 2022-07-26 NOTE — Patient Instructions (Signed)
It was a pleasure to see you in clinic today. - We will collect some additional labs today. - We will follow-up your pathology results. - I will recommend consideration of addition of a nerve medication (possibly gabapentin) to help with the radiating pain you are experiencing.   Thank you very much for allowing me to provide care for you today.  I appreciate your confidence in choosing our Gynecologic Oncology team at [facility].  If you have any questions about your visit today please call our office or send [place] a MyChart message and we will get back to you as soon as possible.

## 2022-07-27 LAB — CANCER ANTIGEN 27.29: CA 27.29: 582.3 U/mL — ABNORMAL HIGH (ref 0.0–38.6)

## 2022-07-27 LAB — AFP TUMOR MARKER: AFP, Serum, Tumor Marker: 3.5 ng/mL (ref 0.0–6.4)

## 2022-07-27 LAB — BETA HCG QUANT (REF LAB): hCG Quant: 2 m[IU]/mL

## 2022-07-27 LAB — CYTOLOGY - NON PAP

## 2022-07-27 LAB — CANCER ANTIGEN 19-9: CA 19-9: 1315 U/mL — ABNORMAL HIGH (ref 0–35)

## 2022-07-28 ENCOUNTER — Other Ambulatory Visit: Payer: Self-pay | Admitting: Physician Assistant

## 2022-07-28 ENCOUNTER — Encounter: Payer: Self-pay | Admitting: Physician Assistant

## 2022-07-28 ENCOUNTER — Encounter: Payer: Self-pay | Admitting: Psychiatry

## 2022-07-28 ENCOUNTER — Telehealth: Payer: Self-pay | Admitting: Physician Assistant

## 2022-07-28 ENCOUNTER — Telehealth: Payer: Self-pay | Admitting: Radiation Oncology

## 2022-07-28 DIAGNOSIS — C7951 Secondary malignant neoplasm of bone: Secondary | ICD-10-CM

## 2022-07-28 DIAGNOSIS — C779 Secondary and unspecified malignant neoplasm of lymph node, unspecified: Secondary | ICD-10-CM

## 2022-07-28 LAB — SURGICAL PATHOLOGY

## 2022-07-28 MED ORDER — OXYCODONE HCL ER 10 MG PO T12A: 10.0000 mg | EXTENDED_RELEASE_TABLET | Freq: Two times a day (BID) | ORAL | 0 refills | Status: DC

## 2022-07-28 MED ORDER — GABAPENTIN 300 MG PO CAPS: 300.0000 mg | ORAL_CAPSULE | Freq: Two times a day (BID) | ORAL | 0 refills | Status: DC

## 2022-07-28 NOTE — Telephone Encounter (Signed)
Called patient per 1/24 secure chat. Patient scheduled and notified.

## 2022-07-28 NOTE — Telephone Encounter (Signed)
I called [name] to review the biopsy results from 07/21/2022. 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 Discussed with pathology that based on IHC stainings, possible primaries including breast and upper GI followed by gyn. Dr. Lorenso Courier recommend mammogram and EGD to evaluate for primary.   Due to persistent radiating pain, we will add oxycontin 10 mg q 12 hours and add gabapentin 300 mg twice daily. She can continue on oxycodone 10 mg every 4-6 hours for breakthrough pain. We will reach out to radiation oncology to evaluate for palliative radiation with biopsy proven diagnosis. Additionally, palliative care referral will be placed to help with chronic pain and other symptoms.

## 2022-07-28 NOTE — Telephone Encounter (Signed)
1/24 @ 2:10 pm Left voicemail for patient to call our office to be schedule for consult.

## 2022-07-29 ENCOUNTER — Telehealth: Payer: Self-pay

## 2022-07-29 ENCOUNTER — Ambulatory Visit
Admission: RE | Admit: 2022-07-29 | Discharge: 2022-07-29 | Disposition: A | Discharge status: Home / Self Care | Payer: BLUE CROSS/BLUE SHIELD | Source: Ambulatory Visit | Attending: Physician Assistant | Admitting: Physician Assistant

## 2022-07-29 ENCOUNTER — Encounter: Payer: Self-pay | Admitting: Family Medicine

## 2022-07-29 ENCOUNTER — Other Ambulatory Visit: Payer: Self-pay | Admitting: Physician Assistant

## 2022-07-29 ENCOUNTER — Encounter: Payer: Self-pay | Admitting: Physician Assistant

## 2022-07-29 DIAGNOSIS — C779 Secondary and unspecified malignant neoplasm of lymph node, unspecified: Secondary | ICD-10-CM

## 2022-07-29 MED ORDER — OXYCODONE HCL ER 10 MG PO T12A: 10.0000 mg | EXTENDED_RELEASE_TABLET | Freq: Two times a day (BID) | ORAL | 0 refills | Status: DC

## 2022-07-29 MED ORDER — XTAMPZA ER 9 MG PO C12A: 9.0000 mg | EXTENDED_RELEASE_CAPSULE | Freq: Two times a day (BID) | ORAL | 0 refills | Status: AC | PRN

## 2022-07-29 NOTE — Progress Notes (Signed)
Prior Authorization request for Oxycontin '10mg'$  ER (Oxycodone '10mg'$  ER) denied. Xtampza ER is a covered preferred medication. Provider notified. Prescription sent to preferred Pharmacy for [facility] ER '9mg'$ .

## 2022-07-29 NOTE — Telephone Encounter (Signed)
Natalie Hinton called in and stated that she needed to get an approval to have her medication OXY '10mg'$  refilled.  She stated that [facility] needed approval to refill.. I sent a message to [place] nurse for today to let her know.

## 2022-07-29 NOTE — Progress Notes (Incomplete)
Histology and Location of Primary Cancer:  Abnormal CT scan concerning for metastatic disease involving the lungs, lymph nodes, pelvic masses  Location(s) of Symptomatic tumor(s):  07/07/2022 CT Head w/ & w/o Contrast --IMPRESSION: Centrally necrotic right level 4 lymph node, consistent with metastatic disease. Please see same-day CT chest report for additional subclavicular lymph nodes. No acute intracranial process. Somewhat asymmetric enlargement of the right sternocleidomastoid muscle, without focal abnormal enhancement or collection, which may be reactive. No evidence of metastatic disease in the brain or skull base. No additional acute process in the neck.  CT C/A/P w/ Contrast --IMPRESSION: Findings typical of metastatic disease in the thorax with innumerable pulmonary nodules, necrotic mediastinal and right thoracic inlet adenopathy. Cystic mass in the pelvic midline, given size, suspicious for neoplasm. There is also heterogeneous lesion posterior to the uterus, unclear if this represents a primary lesion, metastatic deposit or arises from the uterus has a pedunculated lesion. Destructive bone lesion in the left superior pubic ramus typical of metastatic disease. No definite additional osseous metastatic disease seen by CT, however PET scan or bone scan may be helpful for a complete osseous assessment. Site of primary neoplasm is not confidently confirmed, although given size of cystic pelvic mass and adjacent enhancing structure, GYN neoplasm is considered. Recommend oncologic referral for consideration of multi disciplinary assessment. Tissue sampling may be helpful. The right thoracic inlet node may be an accessible site for biopsy.   Biopsies revealed:  07/21/2022  A. PELVIC CYSTIC MASS, FINE NEEDLE ASPIRATION:  FINAL MICROSCOPIC DIAGNOSIS:  - Atypical cells present  - See comment  Comment:  Rare atypical cells are identified.  Further characterization is precluded by the  absence of cellblock   A. LYMPH NODE, RIGHT SUPRACLAVICULAR MASS, NEEDLE CORE BIOPSY:  - Poorly differentiated carcinoma (see Comment)  COMMENT:  Sections from the right supraclavicular lymph node show effaced lymph node with a poorly differentiated carcinoma and necrosis. Immunohistochemical stains reveal the tumor cells are positive for CK7 and negative for PAX8, CDX2, TTF-1 and CK20.  P63 and CK5/6 immunohistochemical stains are pending for further characterization.   Past/Anticipated chemotherapy by medical oncology, if any:  07/14/2022 --Dr. Narda Rutherford Dede Query PA-C's office note) Abnormal CT scan concerning for metastatic disease involving the lungs, lymph nodes, pelvic masses Seen on CT neck and CT CAP from 07/07/2022 Need STAT biopsy for tissue diagnosis.  Disussed with IR and plan for core needle biopsy of right cervical lymph node and aspiration of cystic pelvic mass.  Labs today to CBC, CMP and CA 125 marker RTC once workup is complete Pelvic pain/left hip pain: Likely seondary pelvic masses and destructive bonel esion in left superior pubic ramus Referral sent to GYN ONC surgery to evaluate for cytoreductive surgery of pelvic masses as patient is symptomatic with pain.  Referral sent to radiation oncology to evaluate for palliative radiation to bone lesion  Past/Anticipated interventions by Gyn/Onc surgery, if any:  07/26/2022 --Dr. Bernadene Bell Reviewed with the patient in detail her imaging findings.   Also reviewed her normal CA125.   Pathology pending from biopsies although verbal report from pathology that carcinoma is identified on cervical node core biopsy, but with additional stains pending to help delineate origin of disease. Reviewed that her pelvic mass could represent the primary site of origin of her cancer or could be a metastatic site of a different cancer.   We will await pathology results to help clarify this. Otherwise, based on exam patient's pelvic  pain seems to  originate at the pubic ramus where she has been found on imaging to have a destructive bone lesion with possible extension into adjacent soft tissue.   The radiating pain may be neuropathic in origin as nerves to the medial thigh run adjacent to the pubis including the obturator nerve and the genital branch of the gen/fem nerve.   Given this, would recommend consideration of neuropathic pain medication in addition to her narcotic pain medication, like consideration of gabapentin added to her regimen.   Discussed with patient that at this time I may defer to Dede Query PA-C for prescription so that her medications are coming from one location also considering that her final pathology is still pending.   Also reviewed that in cases like this, sometimes we will refer to palliative care for additional help and symptom management. Globally, reviewed that surgery would not be the initial recommended treatment for this patient.   Do not feel that her pelvic masses are the main source of her pain and instead is primarily from the osseous lesion on her pubic bone.   Systemic treatment and possible radiation treatment would be recommended to both help control disease as well as aid in symptom management. Have ordered additional tumor markers to be collected today given that her CA125 was normal, including germ cell tumor markers. UPT in ED was negative.  Will follow-up results of tumor markers and pathology to discuss further with patient if pathology returns with gynecologic origin.  Patient's main complaints related to symptomatic tumor(s) are: ***  Respiratory Signs/Symptoms Respiratory complaints, if any: {:18581} Hemoptysis, if any: {:18581}  Pain on a scale of 0-10 is: ***   Weight changes, if any: ***  Bowel/Bladder complaints, if any: ***   Nausea/Vomiting, if any: ***  Ambulatory status? Walker? Wheelchair?: ***  SAFETY ISSUES: Prior radiation? *** Pacemaker/ICD?  *** Possible current pregnancy? *** Is the patient on methotrexate? ***  Additional Complaints / other details:  *** Met with [place] [facility] provider on 08/03/2022 --ASSESSMENT AND PLAN:  37 year old female with metastatic disease in thorax, pulmonary nodules, mediastinal and right thoracic adenopathy, left pubic ramus lesion, unknown primary.  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  Based on IHC stainings, pathologist states possible primaries including breast and upper GI Negative MGM Having nausea, vomiting, AB pain, minor esophageal dysphagia but this is more recent.  No specific symptoms for UGI carcinoma but will schedule for EGD to evaluate for primary cancer site with findings I discussed risks of EGD with patient today, including risk of sedation, bleeding or perforation.  Patient provides understanding and gave verbal consent to proceed. Consider bone scan/PET scan Will send in zofran SL and phenergan suppositories for nausea and vomiting. Hemorrhoidal skin tag Evaluated rectum due to patient stating she has hemor

## 2022-07-30 NOTE — Progress Notes (Unsigned)
08/02/2022 Natalie Hinton [id number] 22-Nov-1985  Referring provider: [name], MD Primary GI doctor: Dr. Lorenso Courier  ASSESSMENT AND PLAN:   37 year old female with metastatic disease in thorax, pulmonary nodules, mediastinal and right thoracic adenopathy, left pubic ramus lesion, unknown primary.  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  Based on IHC stainings, pathologist states possible primaries including breast and upper GI Negative MGM Having nausea, vomiting, AB pain, minor esophageal dysphagia but this is more recent.  No specific symptoms for UGI carcinoma but will schedule for EGD to evaluate for primary cancer site with findings I discussed risks of EGD with patient today, including risk of sedation, bleeding or perforation.  Patient provides understanding and gave verbal consent to proceed. Consider bone scan/PET scan Will send in zofran SL and phenergan suppositories for nausea and vomiting.  Hemorrhoidal skin tag Evaluated rectum due to patient stating she has hemorrhoids for a year Only hemorrhoidal skin tags, declined internal exam   Patient Care Team: [name], MD as PCP - General (Family Medicine) Bernadene Bell, MD as Consulting Physician (Gynecologic Oncology)  HISTORY OF PRESENT ILLNESS: 37 y.o. female with a past medical history of hypothyroidism and others listed below presents for evaluation of pelvic mass.   07/07/2022 patient went to the ER with cough, swelling in right neck and pelvic pain CT neck showed somewhat asymmetric enlarged right sternocleidomastoid muscle without focal abnormality, CT abdomen pelvis showed innumerable pulmonary nodules, necrotic mediastinal and right thoracic inlet adenopathy, destructive bone lesion left superior pubic ramus, cystic mass and pelvic midline and heterogeneous lesion posterior to the uterus. 07/26/2022 seen by GYN on for complex pelvic mass and  imaging concerning for metastatic disease with innumerable pulmonary nodules, thoracic and cervical adenopathy instructive osseous pubic ramus lesion. 07/14/22  CA125 was normal at 24.6.  01/16 genetic testing 07/21/22. 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  Based on IHC stainings, pathologist states possible primaries including breast and upper GI followed by gyn.  Dr. Lorenso Courier recommend mammogram and EGD to evaluate for primary.  Mammogram 07/29/2022 negative.  She has had weight loss 40 lbs in last year, gaining weight now.  For the last 3-4 weeks she has had nausea and vomiting. Feels she is dehydrated.  Has lower AB pain, no upper AB pain.  No GERD.  Has had some dysphagia with liquids/foods in last month.  Has had BM daily.  No melena.  No blood in the stool, but for a year had felt knot on her rectum.  Patient half pack smoker for 20 years, 10-pack-year smoking history.  Has not smoked recently due to dry mouth. Continue to spoke marijuana, not daily but 2-3 /week for her appetite and pain.   No alcohol. She is on advil once a week.  Maternal grandfather with lung cancer  She  reports that she has been smoking cigarettes. She started smoking about 20 years ago. She has a 10.00 pack-year smoking history. She has never used smokeless tobacco. She reports current drug use. Drug: Marijuana. She reports that she does not drink alcohol.  A. LYMPH NODE, RIGHT SUPRACLAVICULAR MASS, NEEDLE CORE BIOPSY:  - Poorly differentiated carcinoma (see Comment)   COMMENT:   Sections from the right supraclavicular lymph node show effaced lymph  node with a poorly differentiated carcinoma and necrosis.  Immunohistochemical stains reveal the tumor cells are positive  for CK7  and negative for PAX8, CDX2, TTF-1 and CK20.  P63 and CK5/6  immunohistochemical stains are  pending for further characterization.   Specimen Submitted:  A. PELVIC CYSTIC MASS, FINE NEEDLE ASPIRATION:    FINAL MICROSCOPIC DIAGNOSIS:  - Atypical cells present  - See comment  Comment:  Rare atypical cells are identified.  Further characterization is  precluded by the absence of cellblock.    Current Medications:     Current Outpatient Medications (Respiratory):    promethazine (PHENERGAN) 12.5 MG suppository, Place 1 suppository (12.5 mg total) rectally every 8 (eight) hours as needed for nausea or vomiting.  Current Outpatient Medications (Analgesics):    oxyCODONE 10 MG TABS, Take 0.5 tablets (5 mg total) by mouth every 4 (four) hours as needed for severe pain.   oxyCODONE ER (XTAMPZA ER) 9 MG C12A, Take 9 mg by mouth every 12 (twelve) hours as needed for up to 7 days.   Current Outpatient Medications (Other):    gabapentin (NEURONTIN) 300 MG capsule, Take 1 capsule (300 mg total) by mouth 2 (two) times daily.   ondansetron (ZOFRAN-ODT) 8 MG disintegrating tablet, Take 1 tablet (8 mg total) by mouth every 8 (eight) hours as needed for nausea or vomiting.  Medical History:  Past Medical History:  Diagnosis Date   Thyroid disease    Allergies:  Allergies  Allergen Reactions   Penicillins      Surgical History:  She  has no past surgical history on file. Family History:  Her family history includes Lung cancer (age of onset: 60 - 4) in her maternal grandfather.  REVIEW OF SYSTEMS  : All other systems reviewed and negative except where noted in the History of Present Illness.  PHYSICAL EXAM: BP (!) 110/90   Pulse 69   Ht 5' (1.524 m)   Wt 84 lb 2 oz (38.2 kg)   LMP 04/04/2022 (Approximate)   BMI 16.43 kg/m  General:   chronically ill appearing female appears to be uncomfortable but no distress Head:   Normocephalic and atraumatic, no teeth Eyes:  sclerae anicteric,conjunctive pink  Heart:   regular rate and rhythm Pulm:  Clear anteriorly; no  wheezing Abdomen:   Soft, Non-distended, thin AB, Sluggish bowel sounds. moderate tenderness in the lower abdomen. With guarding and Without rebound, No organomegaly appreciated. Rectal: showed external benign appearing hemorrhoidal skin tag, patient declined internal rectal exam Extremities:  Without edema. Msk: Symmetrical without gross deformities. Peripheral pulses intact.  Neurologic:  Alert and  oriented x4;  No focal deficits.  Skin:   Dry and intact without significant lesions or rashes. Psychiatric:  Cooperative. Tearful.   RELEVANT LABS AND IMAGING: CBC    Component Value Date/Time   WBC 16.5 (H) 07/14/2022 1420   WBC 15.7 (H) 07/07/2022 1529   RBC 5.15 (H) 07/14/2022 1420   HGB 14.8 07/14/2022 1420   HCT 45.1 07/14/2022 1420   PLT 405 (H) 07/14/2022 1420   MCV 87.6 07/14/2022 1420   MCH 28.7 07/14/2022 1420   MCHC 32.8 07/14/2022 1420   RDW 13.4 07/14/2022 1420   LYMPHSABS 2.6 07/14/2022 1420   MONOABS 0.9 07/14/2022 1420   EOSABS 0.1 07/14/2022 1420   BASOSABS 0.0 07/14/2022 1420    CMP     Component Value Date/Time   NA 137 07/14/2022 1420   K 4.3 07/14/2022 1420   CL 100 07/14/2022 1420   CO2 27 07/14/2022 1420   GLUCOSE 91 07/14/2022 1420   BUN 8 07/14/2022 1420  CREATININE 0.49 07/14/2022 1420   CALCIUM 9.1 07/14/2022 1420   PROT 8.0 07/14/2022 1420   ALBUMIN 3.6 07/14/2022 1420   AST 25 07/14/2022 1420   ALT 17 07/14/2022 1420   ALKPHOS 87 07/14/2022 1420   BILITOT <0.1 (L) 07/14/2022 1420   GFRNONAA >60 07/14/2022 1420   GFRAA >60 12/18/2019 2010     Vladimir Crofts, PA-C 2:22 PM

## 2022-07-31 ENCOUNTER — Encounter: Payer: Self-pay | Admitting: Family Medicine

## 2022-08-02 ENCOUNTER — Encounter: Payer: Self-pay | Admitting: Physician Assistant

## 2022-08-02 ENCOUNTER — Ambulatory Visit (INDEPENDENT_AMBULATORY_CARE_PROVIDER_SITE_OTHER): Payer: Medicaid Other | Admitting: Physician Assistant

## 2022-08-02 VITALS — BP 110/90 | HR 69 | Ht 60.0 in | Wt 84.1 lb

## 2022-08-02 DIAGNOSIS — R590 Localized enlarged lymph nodes: Secondary | ICD-10-CM | POA: Diagnosis not present

## 2022-08-02 DIAGNOSIS — R109 Unspecified abdominal pain: Secondary | ICD-10-CM | POA: Diagnosis not present

## 2022-08-02 DIAGNOSIS — R112 Nausea with vomiting, unspecified: Secondary | ICD-10-CM | POA: Diagnosis not present

## 2022-08-02 DIAGNOSIS — C799 Secondary malignant neoplasm of unspecified site: Secondary | ICD-10-CM | POA: Diagnosis not present

## 2022-08-02 MED ORDER — PROMETHAZINE HCL 12.5 MG RE SUPP: 12.5000 mg | Freq: Three times a day (TID) | RECTAL | 0 refills | Status: DC | PRN

## 2022-08-02 MED ORDER — ONDANSETRON 8 MG PO TBDP: 8.0000 mg | ORAL_TABLET | Freq: Three times a day (TID) | ORAL | 0 refills | Status: DC | PRN

## 2022-08-02 NOTE — Patient Instructions (Addendum)
We have sent the following medications to your pharmacy for you to pick up at your convenience: Zofran, Promethazine Suppository  You have been scheduled for an endoscopy. Please follow written instructions given to you at your visit today. If you use inhalers (even only as needed), please bring them with you on the day of your procedure.    _______________________________________________________  If your blood pressure at your visit was 140/90 or greater, please contact your primary care physician to follow up on this.  _______________________________________________________ If you are age 36 or younger, your body mass index should be between 19-25. Your Body mass index is 16.43 kg/m. If this is out of the aformentioned range listed, please consider follow up with your Primary Care Provider.   __________________________________________________________  The [facility] providers would like to encourage you to use [facility] to communicate with providers for non-urgent requests or questions.  Due to long hold times on the telephone, sending your provider a message by [facility] may be a faster and more efficient way to get a response.  Please allow 48 business hours for a response.  Please remember that this is for non-urgent requests.   Due to recent changes in healthcare laws, you may see the results of your imaging and laboratory studies on MyChart before your provider has had a chance to review them.  We understand that in some cases there may be results that are confusing or concerning to you. Not all laboratory results come back in the same time frame and the provider may be waiting for multiple results in order to interpret others.  Please give [place] 48 hours in order for your provider to thoroughly review all the results before contacting the office for clarification of your results.    Thank you for choosing me and [facility].  Vicie Mutters, PA.

## 2022-08-03 NOTE — Progress Notes (Signed)
I agree with the assessment and plan as outlined by Ms. Collier. 

## 2022-08-03 NOTE — Progress Notes (Incomplete)
Radiation Oncology         [phone] ________________________________  Initial Outpatient Consultation  Name: Natalie Hinton MRN: [id number]  Date: 08/04/2022  DOB: 12/09/1985  HU:[id number], Catalina Lunger, MD  Orson Slick, MD   REFERRING PHYSICIAN: Orson Slick, MD  DIAGNOSIS: The encounter diagnosis was Pelvic mass.  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 female who is accompanied by ***. she is seen as a courtesy of Dr. Lorenso Courier for an opinion concerning radiation therapy as part of management for her recently diagnosed multifocal metastatic disease from an unknown primary .   The patient initially presented to an urgent care facility on 07/07/22 with the cc of cough x 1 month. Chest x-ray performed showed innumerable rounded solid bilateral, lower lobe predominant pulmonary nodules, highly suspicious for metastatic disease.   The patient was accordingly transferred to the ED that same date for further evaluation. In the ED, the patient endorsed  productive cough, right sided neck swelling, and left sided pelvic pain for several weeks. She also endorsed a 25-40 pound weight loss over the past several months. Chest CT performed in the ED showed multiple bilateral soft tissue nodules, consisting of approximately 30 right sided nodules, and approximately 25 nodules on the left measuring up to 2 cm. CT also showed multiple areas of abnormal lymph nodes in the right thoracic inlet and mediastinum. Soft tissue neck CT and CT of the head with and without contrast demonstrated a centrally necrotic right level 4 lymph node consistent with metastatic disease. CT otherwise showed no evidence of metastatic disease in the brain or skull base, and no additional acute processes in the neck. CT CAP also  performed demonstrated: findings typical of metastatic disease in the thorax with innumerable pulmonary nodules; necrotic mediastinal and right thoracic inlet adenopathy; a cystic mass in the pelvic midline concerning for neoplasm; a heterogeneous lesion posterior to the uterus; and a destructive bone lesion in the left superior pubic ramus typical of metastatic disease. CT otherwise showed no other definite sites of osseous metastatic disease.    Dr. Lorenso Courier was consulted in the ED who arranged for OP follow-up for a tissue biopsy.   Accordingly, the patient met with PA Thayil (medical oncology) on 07/14/22 for further management. During this visit, the patient reported: a 30-40 pound unintentional weight loss over the last 3 months, persistent severe pelvic pain for the last month without relief using OTC analgesics, pain with urination and bowel movement, yellow/white vaginal discharge, left hip pain that is persistent and worse with ambulation, persistent shortness of breath with exertion and at rest, and night sweats.     The patient opted to proceed with a needle core biopsy of the right supraclavicular mass/lymph node on 07/21/21. Pathology revealed poorly differentiated carcinoma. FNA of the pelvic cystic mass also performed on this date showed atypical cells present.   Her pain management regimen while undergoing work up included IM dilaudid.     Supraclavicular and mediastinal lymphadenopathy prompted a diagnostic bilateral breast mammogram and right breast ultrasound on 07/29/22 which showed no mammographic evidence of malignancy in either breast.    The patient was then referred to Dr. Ernestina Patches on 07/26/22 for further evaluation. Exam findings during this visit showed evidence to suggest that he pelvic pain originates at the pubic ramus, correlating with  the site of the destructive bone lesion with possible extension into adjacent soft tissue. Given that the bone lesion is the main source of her  pain, Dr. Ernestina Patches recommends chemotherapy +/- radiation to both help control disease as well as aid in symptom management. Dr. Ernestina Patches also recommends consideration of neuropathic pain medication in addition to her narcotic pain medication.   Dr. Ernestina Patches also sent for molecular studies, and based on IHC, her primaries may include either a breast primary or upper GI primary. Subsequently, the patient was recently referred to GI on 08/02/22 who have recommended an EGD to assess for a primary site.    PREVIOUS RADIATION THERAPY: No  PAST MEDICAL HISTORY:  Past Medical History:  Diagnosis Date   Thyroid disease     PAST SURGICAL HISTORY:No past surgical history on file.  FAMILY HISTORY:  Family History  Problem Relation Age of Onset   Lung cancer Maternal Grandfather 57 - 69   Breast cancer Neg Hx    Ovarian cancer Neg Hx    Endometrial cancer Neg Hx    Prostate cancer Neg Hx    Pancreatic cancer Neg Hx     SOCIAL HISTORY:  Social History   Tobacco Use   Smoking status: Every Day    Packs/day: 0.50    Years: 20.00    Total pack years: 10.00    Types: Cigarettes    Start date: 2004   Smokeless tobacco: Never  Vaping Use   Vaping Use: Never used  Substance Use Topics   Alcohol use: Never   Drug use: Yes    Types: Marijuana    ALLERGIES:  Allergies  Allergen Reactions   Penicillins     MEDICATIONS:  Current Outpatient Medications  Medication Sig Dispense Refill   gabapentin (NEURONTIN) 300 MG capsule Take 1 capsule (300 mg total) by mouth 2 (two) times daily. 60 capsule 0   ondansetron (ZOFRAN-ODT) 8 MG disintegrating tablet Take 1 tablet (8 mg total) by mouth every 8 (eight) hours as needed for nausea or vomiting. 20 tablet 0   oxyCODONE 10 MG TABS Take 0.5 tablets (5 mg total) by mouth every 4 (four) hours as needed for severe pain. 90 tablet 0   oxyCODONE ER (XTAMPZA ER) 9 MG C12A Take 9 mg by mouth every 12 (twelve) hours as needed for up to 7 days. 14 capsule 0    promethazine (PHENERGAN) 12.5 MG suppository Place 1 suppository (12.5 mg total) rectally every 8 (eight) hours as needed for nausea or vomiting. 12 suppository 0   No current facility-administered medications for this encounter.    REVIEW OF SYSTEMS:  A 10+ POINT REVIEW OF SYSTEMS WAS OBTAINED including neurology, dermatology, psychiatry, cardiac, respiratory, lymph, extremities, GI, GU, musculoskeletal, constitutional, reproductive, HEENT. ***   PHYSICAL EXAM:  vitals were not taken for this visit.   General: Alert and oriented, in no acute distress HEENT: Head is normocephalic. Extraocular movements are intact. Oropharynx is clear. Neck: Neck is supple, no palpable cervical or supraclavicular lymphadenopathy. Heart: Regular in rate and rhythm with no murmurs, rubs, or gallops. Chest: Clear to auscultation bilaterally, with no rhonchi, wheezes, or rales. Abdomen: Soft, nontender, nondistended, with no rigidity or guarding. Extremities: No cyanosis or edema. Lymphatics: see Neck Exam Skin: No concerning lesions. Musculoskeletal: symmetric strength and muscle tone throughout. Neurologic: Cranial nerves II through XII are grossly intact. No obvious focalities. Speech is fluent. Coordination is intact. Psychiatric: Judgment and insight are intact. Affect is appropriate. ***  ECOG = ***  0 - Asymptomatic (Fully active, able to carry on all predisease activities without restriction)  1 - Symptomatic but completely ambulatory (Restricted in physically strenuous activity but ambulatory and able to carry out work of a light or sedentary nature. For example, light housework, office work)  2 - Symptomatic, <50% in bed during the day (Ambulatory and capable of all self care but unable to carry out any work activities. Up and about more than 50% of waking hours)  3 - Symptomatic, >50% in bed, but not bedbound (Capable of only limited self-care, confined to bed or chair 50% or more of waking  hours)  4 - Bedbound (Completely disabled. Cannot carry on any self-care. Totally confined to bed or chair)  5 - Death   Eustace Pen MM, Creech RH, Tormey DC, et al. [phone]). "Toxicity and response criteria of the [facility]". [place] Oncol. 5 (6): 649-55  LABORATORY DATA:  Lab Results  Component Value Date   WBC 16.5 (H) 07/14/2022   HGB 14.8 07/14/2022   HCT 45.1 07/14/2022   MCV 87.6 07/14/2022   PLT 405 (H) 07/14/2022   NEUTROABS 12.8 (H) 07/14/2022   Lab Results  Component Value Date   NA 137 07/14/2022   K 4.3 07/14/2022   CL 100 07/14/2022   CO2 27 07/14/2022   GLUCOSE 91 07/14/2022   BUN 8 07/14/2022   CREATININE 0.49 07/14/2022   CALCIUM 9.1 07/14/2022      RADIOGRAPHY: MM DIAG BREAST TOMO BILATERAL  Result Date: 07/29/2022 CLINICAL DATA:  37 year old female with recent CT scan demonstrating widely metastatic disease with innumerable pulmonary nodules, supraclavicular and mediastinal lymphadenopathy, pelvic/presacral mass and osseous metastases. Core biopsy of a right supraclavicular node revealed a poorly differentiated carcinoma. EXAM: DIGITAL DIAGNOSTIC BILATERAL MAMMOGRAM WITH TOMOSYNTHESIS; ULTRASOUND RIGHT BREAST LIMITED TECHNIQUE: Bilateral digital diagnostic mammography and breast tomosynthesis was performed.; Targeted ultrasound examination of the right breast was performed COMPARISON:  None. ACR Breast Density Category d: The breast tissue is extremely dense, which lowers the sensitivity of mammography. FINDINGS: No suspicious masses or calcifications are seen in either breast. An initially questioned asymmetry seen in the right breast is felt to resolve on the additional imaging, however given extreme breast density ultrasound will be performed. Targeted ultrasound of the right breast was performed. No suspicious masses or any other worrisome abnormalities identified, only extremely dense fibroglandular tissue seen. IMPRESSION: No mammographic  evidence of malignancy in either breast. RECOMMENDATION: 1. If there is concern for a primary breast malignancy, then further evaluation with breast MRI with contrast would be recommended. 2.  Screening mammogram in one year.(Code:SM-B-01Y) I have discussed the findings and recommendations with the patient. If applicable, a reminder letter will be sent to the patient regarding the next appointment. BI-RADS CATEGORY  1: Negative. Electronically Signed   By: Everlean Alstrom M.D.   On: 07/29/2022 16:58   US BREAST LTD UNI RIGHT INC AXILLA  Result Date: 07/29/2022 CLINICAL DATA:  37 year old female with recent CT scan demonstrating widely metastatic disease with innumerable pulmonary nodules, supraclavicular and mediastinal lymphadenopathy, pelvic/presacral mass and osseous metastases. Core biopsy of a right supraclavicular node revealed a poorly differentiated carcinoma. EXAM: DIGITAL DIAGNOSTIC BILATERAL MAMMOGRAM WITH TOMOSYNTHESIS; ULTRASOUND RIGHT BREAST LIMITED TECHNIQUE: Bilateral digital diagnostic mammography and breast tomosynthesis was performed.; Targeted ultrasound examination of the right breast was performed COMPARISON:  None. ACR Breast Density Category d: The breast tissue is extremely dense, which lowers the sensitivity of mammography. FINDINGS: No suspicious masses or calcifications  are seen in either breast. An initially questioned asymmetry seen in the right breast is felt to resolve on the additional imaging, however given extreme breast density ultrasound will be performed. Targeted ultrasound of the right breast was performed. No suspicious masses or any other worrisome abnormalities identified, only extremely dense fibroglandular tissue seen. IMPRESSION: No mammographic evidence of malignancy in either breast. RECOMMENDATION: 1. If there is concern for a primary breast malignancy, then further evaluation with breast MRI with contrast would be recommended. 2.  Screening mammogram in one  year.(Code:SM-B-01Y) I have discussed the findings and recommendations with the patient. If applicable, a reminder letter will be sent to the patient regarding the next appointment. BI-RADS CATEGORY  1: Negative. Electronically Signed   By: Everlean Alstrom M.D.   On: 07/29/2022 16:58  [place] CORE BIOPSY (LYMPH NODES)  Result Date: 07/21/2022 INDICATION: 37 year old female with history of enlarged right cervical lymph node concerning for metastatic disease. EXAM: Ultrasound-guided core biopsy of cervical lymph node MEDICATIONS: None. ANESTHESIA/SEDATION: None. FLUOROSCOPY TIME:  None. COMPLICATIONS: None immediate. PROCEDURE: Informed written consent was obtained from the patient after a thorough discussion of the procedural risks, benefits and alternatives. All questions were addressed. Maximal Sterile Barrier Technique was utilized including caps, mask, sterile gowns, sterile gloves, sterile drape, hand hygiene and skin antiseptic. A timeout was performed prior to the initiation of the procedure. Preprocedure ultrasound evaluation of the right neck demonstrated a large, heterogeneously hypoechoic, irregularly shaped lymph node measuring approximately 3.1 x 2.5 cm. The procedure was planned. Subdermal Local anesthesia was provided at the planned needle entry site with 1% lidocaine. Under direct ultrasound visualization, a 17 gauge coaxial introducer needle was positioned in the periphery of the lymph node. This was followed by a total of 2, 18 gauge core biopsy samples which were placed in formalin. The needle was removed and hemostasis was achieved with brief manual compression. Postprocedure ultrasound evaluation demonstrated no evidence of surrounding hematoma or other complicating features. The patient tolerated the procedure well was discharged home in good condition. IMPRESSION: Technically successful ultrasound-guided core biopsy of abnormally enlarged right cervical lymph node. Ruthann Cancer, MD [facility] Radiology Electronically Signed   By: Ruthann Cancer M.D.   On: 07/21/2022 12:03   [place] FINE NEEDLE ASP 1ST LESION  Result Date: 07/21/2022 INDICATION: 37 year old female with history of large pelvic cystic mass presenting for aspiration. EXAM: Ultrasound-guided pelvic cyst aspiration MEDICATIONS: The patient is currently admitted to the hospital and receiving intravenous antibiotics. The antibiotics were administered within an appropriate time frame prior to the initiation of the procedure. ANESTHESIA/SEDATION: None. COMPLICATIONS: None immediate. PROCEDURE: Informed written consent was obtained from the patient after a thorough discussion of the procedural risks, benefits and alternatives. All questions were addressed. Maximal Sterile Barrier Technique was utilized including caps, mask, sterile gowns, sterile gloves, sterile drape, hand hygiene and skin antiseptic. A timeout was performed prior to the initiation of the procedure. The left lower quadrant was prepped and draped in standard fashion. Preprocedure ultrasound evaluation demonstrated large simple appearing pelvic cystic mass. Subdermal Local anesthesia was provided at the planned needle entry site. Deeper local anesthetic was provided to the level of the peritoneum under direct ultrasound visualization. A 10 cm Yueh catheter was then inserted under direct ultrasound guidance into the fluid collection. Aspiration was performed which yielded approximately 900 mL of translucent, slightly straw-colored fluid. A sample was sent for cytology. The needle was removed and hemostasis was achieved with brief manual  compression. Postprocedure imaging demonstrated near complete resolution of the cystic collection without complicating features. The patient tolerated the procedure well was discharged in good condition. IMPRESSION: Technically successful ultrasound-guided aspiration of pelvic cyst yielding approximately  900 mL of translucent, lightly straw-colored fluid. A sample was sent for cytology. Ruthann Cancer, MD [facility] Radiology Electronically Signed   By: Ruthann Cancer M.D.   On: 07/21/2022 11:48   CT CHEST ABDOMEN PELVIS W CONTRAST  Result Date: 07/07/2022 CLINICAL DATA:  Metastatic disease evaluation, occult malignancy. Back pain, knee pain, shoulder pain and headache. This area. Patient reports weight loss, night sweats, body pain and bone pain. EXAM: CT CHEST, ABDOMEN, AND PELVIS WITH CONTRAST TECHNIQUE: Multidetector CT imaging of the chest, abdomen and pelvis was performed following the standard protocol during bolus administration of intravenous contrast. RADIATION DOSE REDUCTION: This exam was performed according to the departmental dose-optimization program which includes automated exposure control, adjustment of the mA and/or kV according to patient size and/or use of iterative reconstruction technique. CONTRAST:  70m OMNIPAQUE IOHEXOL 350 MG/ML SOLN COMPARISON:  Noncontrast chest CT earlier today. FINDINGS: CT CHEST FINDINGS Cardiovascular: Normal caliber thoracic aorta. No central pulmonary embolus on this exam not tailored to pulmonary artery assessment. No pericardial effusion or thickening. Mediastinum/Nodes: 2.6 cm lesion with central hypodensity in the anterior right paratracheal station is likely necrotic node series 3, image 19. Presumed necrotic right thoracic inlet node measuring 2.6 cm series 3, image 7. there additional necrotic nodes in the highest mediastinum. Ill-defined right hilar soft tissue density likely represents adenopathy, series 3, image 26. There is also a 10 mm right hilar node series 3, image 28. No esophageal wall thickening. No discrete thyroid nodule. No axillary adenopathy. Lungs/Pleura: Mild apical emphysema or blebs. Numeral wall bilateral pulmonary nodules within both lungs typical of metastatic disease. Index lesion in  the right lower lobe measures 19 x 16 mm, series 5, image 128. No pleural effusion or significant pleural thickening. Minimal retained mucus within the central bronchi. There is slight narrowing of the right upper lobe bronchus due to mediastinal adenopathy. Mild right lower lobe bronchiectasis with occasional mucoid impaction. Musculoskeletal: No obvious destructive lytic or blastic osseous lesion. No dominant breast mass. Generalized paucity of body fat. CT ABDOMEN PELVIS FINDINGS Hepatobiliary: Water density 12 mm lesion in the right hepatic dome series 3, image 51, typically cyst. No evidence of enhancing liver lesion. Gallbladder physiologically distended, no calcified stone. No biliary dilatation. Pancreas: Suboptimally assessed due to lack of enteric contrast and paucity of intra-abdominal fat. There is no obvious pancreatic mass. No ductal dilatation or inflammation. Spleen: Normal in size. Punctate calcified granuloma. No splenic lesion. Adrenals/Urinary Tract: No adrenal nodule. Dilatation of both renal pelvis, left greater than right. No perinephric edema. Intermittent ureteral dilatation. The urinary bladder is slightly displaced into the left pelvis by pelvic mass. No bladder wall thickening. No suspicious renal abnormality. No definite renal calculi, although excreted contrast in the renal collecting systems obscured assessment. Stomach/Bowel: Bowel assessment is limited in the absence of enteric contrast and paucity of intra-abdominal fat. Fluid/ingested material within the stomach. No bowel dilatation or evidence of obstruction moderate volume of colonic stool within the ascending, transverse and descending colon. Sigmoid colon is mildly redundant and displaced by pelvic mass. Vascular/Lymphatic: Patent portal, splenic and mesenteric veins. Normal caliber abdominal aorta. Limited assessment for adenopathy in the absence of enteric contrast and paucity of intra-abdominal fat. There is no convincing  enlarged lymph nodes in  the abdomen or pelvis. Reproductive: Large midline cystic pelvic mass measures 14 x 10.8 x 11.3 cm, series 3, image 100. Extending superiorly from this on the right is a dilated fluid-filled tubular structure that may represent hydrosalpinx query separate cystic component. This may arise from the right ovary which is not discretely visualized. No convincing solid components. The left ovary is also not discretely visualized. The uterus is deviated into the right pelvis. Posterior to the uterus in the posterior pelvic midline the heterogeneous lobulated heterogeneously enhancing lesion measuring 5.6 x 4.9 cm, series 3, image 104, possible connection to the lower aspect of the uterus but not definite. There may be heterogeneity of the lower cervix and vagina. Other: Suspect minimal free fluid in the right pelvis. No upper abdominal ascites or free fluid. No convincing omental thickening, although paucity of body fat limits assessment. Musculoskeletal: Destructive lesion involving the left superior pubic ramus approaches the pubic body with cortical destruction, series 3, image 116. there is an extraosseous soft tissue component extending into the adjacent obturator musculature. No definite additional bone lesion. IMPRESSION: 1. Findings typical of metastatic disease in the thorax with innumerable pulmonary nodules, necrotic mediastinal and right thoracic inlet adenopathy. 2. Cystic mass in the pelvic midline, given size, suspicious for neoplasm. There is also heterogeneous lesion posterior to the uterus, unclear if this represents a primary lesion, metastatic deposit or arises from the uterus has a pedunculated lesion. 3. Destructive bone lesion in the left superior pubic ramus typical of metastatic disease. No definite additional osseous metastatic disease seen by CT, however PET scan or bone scan may be helpful for a complete osseous assessment. 4. Site of primary neoplasm is not confidently  confirmed, although given size of cystic pelvic mass and adjacent enhancing structure, GYN neoplasm is considered. Recommend oncologic referral for consideration of multi disciplinary assessment. Tissue sampling may be helpful. The right thoracic inlet node may be an accessible site for biopsy. Electronically Signed   By: Keith Rake M.D.   On: 07/07/2022 18:27   CT Soft Tissue Neck W Contrast  Result Date: 07/07/2022 CLINICAL DATA:  Metastatic disease evaluation; multiple lung nodules EXAM: CT HEAD WITHOUT AND WITH CONTRAST CT NECK WITH CONTRAST TECHNIQUE: Contiguous axial images were obtained from the base of the skull through the vertex without and with intravenous contrast. Multidetector CT imaging of the and neck was performed using the standard protocol following the bolus administration of intravenous contrast. RADIATION DOSE REDUCTION: This exam was performed according to the departmental dose-optimization program which includes automated exposure control, adjustment of the mA and/or kV according to patient size and/or use of iterative reconstruction technique. CONTRAST:  40m OMNIPAQUE IOHEXOL 350 MG/ML SOLN COMPARISON:  CT chest 07/07/2022; no prior head or neck CT FINDINGS: CT HEAD FINDINGS Brain: No evidence of acute infarct, hemorrhage, mass, mass effect, or midline shift. No hydrocephalus or extra-axial fluid collection. No abnormal enhancement. Vascular: No hyperdense vessel. Normal arterial and venous enhancement. Skull: Normal. Negative for fracture or focal lesion. Sinuses/Orbits: Mucosal thickening in the right sphenoid sinus. Otherwise clear paranasal sinuses. No acute finding in the orbits. Other: The mastoid air cells are well aerated. CT NECK FINDINGS Pharynx and larynx: Normal. No mass or swelling. Salivary glands: No inflammation, mass, or stone. Thyroid: Hypoenhancing nodule in the right thyroid lobe, which measures up to 7 mm (series 9, image 76), for which no follow-up is  currently indicated. (Reference: J Am Coll Radiol. 2015 Feb;12(2): 143-50) Lymph nodes: Centrally necrotic right level 4  lymph node measures 2.8 x 2.5 x 3.6 cm (AP x TR x CC) (series 9, image 79 and series 8, image 62). Prominent adjacent right level 4 lymph node (series 9, image 73), which is not significantly enlarged or centrally necrotic. No other abnormal cervical lymph nodes. Please see same-day CT chest report for subclavicular lymph nodes. Vascular: Patent. Skeleton: No acute osseous abnormality. Edentulous. Upper chest: Please see same-day CT chest Other: Somewhat asymmetric enlargement of the right sternocleidomastoid muscle, without focal abnormal enhancement or collection, which may be reactive to the aforementioned right level 4 necrotic lymph node. IMPRESSION: 1. Centrally necrotic right level 4 lymph node, consistent with metastatic disease. Please see same-day CT chest report for additional subclavicular lymph nodes. 2. No acute intracranial process. 3. Somewhat asymmetric enlargement of the right sternocleidomastoid muscle, without focal abnormal enhancement or collection, which may be reactive. 4. No evidence of metastatic disease in the brain or skull base. 5. No additional acute process in the neck. Electronically Signed   By: Merilyn Baba M.D.   On: 07/07/2022 18:14   CT Head W or Wo Contrast  Result Date: 07/07/2022 CLINICAL DATA:  Metastatic disease evaluation; multiple lung nodules EXAM: CT HEAD WITHOUT AND WITH CONTRAST CT NECK WITH CONTRAST TECHNIQUE: Contiguous axial images were obtained from the base of the skull through the vertex without and with intravenous contrast. Multidetector CT imaging of the and neck was performed using the standard protocol following the bolus administration of intravenous contrast. RADIATION DOSE REDUCTION: This exam was performed according to the departmental dose-optimization program which includes automated exposure control, adjustment of the mA and/or  kV according to patient size and/or use of iterative reconstruction technique. CONTRAST:  39m OMNIPAQUE IOHEXOL 350 MG/ML SOLN COMPARISON:  CT chest 07/07/2022; no prior head or neck CT FINDINGS: CT HEAD FINDINGS Brain: No evidence of acute infarct, hemorrhage, mass, mass effect, or midline shift. No hydrocephalus or extra-axial fluid collection. No abnormal enhancement. Vascular: No hyperdense vessel. Normal arterial and venous enhancement. Skull: Normal. Negative for fracture or focal lesion. Sinuses/Orbits: Mucosal thickening in the right sphenoid sinus. Otherwise clear paranasal sinuses. No acute finding in the orbits. Other: The mastoid air cells are well aerated. CT NECK FINDINGS Pharynx and larynx: Normal. No mass or swelling. Salivary glands: No inflammation, mass, or stone. Thyroid: Hypoenhancing nodule in the right thyroid lobe, which measures up to 7 mm (series 9, image 76), for which no follow-up is currently indicated. (Reference: J Am Coll Radiol. 2015 Feb;12(2): 143-50) Lymph nodes: Centrally necrotic right level 4 lymph node measures 2.8 x 2.5 x 3.6 cm (AP x TR x CC) (series 9, image 79 and series 8, image 62). Prominent adjacent right level 4 lymph node (series 9, image 73), which is not significantly enlarged or centrally necrotic. No other abnormal cervical lymph nodes. Please see same-day CT chest report for subclavicular lymph nodes. Vascular: Patent. Skeleton: No acute osseous abnormality. Edentulous. Upper chest: Please see same-day CT chest Other: Somewhat asymmetric enlargement of the right sternocleidomastoid muscle, without focal abnormal enhancement or collection, which may be reactive to the aforementioned right level 4 necrotic lymph node. IMPRESSION: 1. Centrally necrotic right level 4 lymph node, consistent with metastatic disease. Please see same-day CT chest report for additional subclavicular lymph nodes. 2. No acute intracranial process. 3. Somewhat asymmetric enlargement of the  right sternocleidomastoid muscle, without focal abnormal enhancement or collection, which may be reactive. 4. No evidence of metastatic disease in the brain or skull base. 5. No  additional acute process in the neck. Electronically Signed   By: Merilyn Baba M.D.   On: 07/07/2022 18:14   CT CHEST WO CONTRAST  Addendum Date: 07/07/2022   ADDENDUM REPORT: 07/07/2022 16:25 CLINICAL DATA:  Cough, back pain dysuria EXAM: CT CHEST WITHOUT CONTRAST TECHNIQUE: Multidetector CT imaging of the chest was performed following the standard protocol without IV contrast. RADIATION DOSE REDUCTION: This exam was performed according to the departmental dose-optimization program which includes automated exposure control, adjustment of the mA and/or kV according to patient size and/or use of iterative reconstruction technique. COMPARISON:  Chest x-ray 07/07/2022 FINDINGS: Cardiovascular: Grossly normal caliber thoracic aorta. No vascular calcifications. Heart is not enlarged. Small pericardial effusion. Mediastinum/Nodes: No abnormal lymph nodes present in the axillary region on this noncontrast exam. Evaluation of the hila is limited. There is presumed abnormal lymph nodes right paratracheal. On series 3, image 17 area measures 3.2 by 3.4 cm. Additional presumed lymph node though the thoracic inlet on the right side lateral to the right thyroid lobe measuring 3.2 by 2.6 cm on series 3, image 4. Normal caliber thoracic esophagus. Thyroid gland is unremarkable. Lungs/Pleura: As seen on the x-ray there are numerous lung masses identified. At least 25 in the left lung and close to 30 on the right. These are of varying size. Larger foci seen in the right lower lobe on image 109 of series 6 measures 19 by 18 mm. Larger foci on left seen posterior to the left hilum on image 84 of series 6 measuring 15 by 15 mm. These are diffusely distributed. Are some areas of mild debris along the right main bronchus. No pleural effusion or pneumothorax.  Few subpleural blebs at the apices bilaterally. Upper Abdomen: Adrenal glands are grossly preserved. There appears to be some high density material or possible contrast in the colon. Presumed hepatic cyst. With the lung findings unless there are specific history contrast study of the chest abdomen pelvis may be of some benefit. Musculoskeletal: No chest wall mass or suspicious bone lesions identified. With the lung findings of whole-body bone scan may also be of benefit when clinically appropriate IMPRESSION: Multiple bilateral soft tissue lung nodules, up to 30 on the right and 25 on the left measuring up to 2 cm. Multiple areas of abnormal lymph nodes in the right thoracic inlet, mediastinum. Please correlate for any known history of malignancy or further workup is recommended Case to be discussed with the emergency room physician by myself on the afternoon of 07/07/2022 Case discussed with PA Rica Mote by myself on the afternoon of 07/07/2022 at 1623 pm EST Electronically Signed   By: Jill Side M.D.   On: 07/07/2022 16:25   Result Date: 07/07/2022 CLINICAL DATA:  Cough, back pain dysuria EXAM: CT CHEST WITHOUT CONTRAST TECHNIQUE: Multidetector CT imaging of the chest was performed following the standard protocol without IV contrast. RADIATION DOSE REDUCTION: This exam was performed according to the departmental dose-optimization program which includes automated exposure control, adjustment of the mA and/or kV according to patient size and/or use of iterative reconstruction technique. COMPARISON:  Chest x-ray 07/07/2022 FINDINGS: Cardiovascular: Mostly normal caliber thoracic aorta. No vascular calcifications. Heart is not enlarged. Small pericardial effusion. Mediastinum/Nodes: No abnormal dorsum and present in the axillary region on this noncontrast exam. Virus hila is limited. There is presumed abnormal lymph nodes right paratracheal. On series 3, image 17 area measures 3.2 by 3.4 cm. Additional presumed lymph  node though the thoracic inlet on the right side lateral to  the right thyroid lobe measuring 3.2 by 2.6 cm on series 3, image 4. Normal caliber thoracic esophagus. Thyroid gland is unremarkable. Lungs/Pleura: As seen on the x-ray there are numerous lung masses identified. At least 25% in the left lung and close to 30 on the right. These are of varying size. Larger foci seen in the right lower lobe on image 109 of series 6 measures 19 by 18 mm. Larger foci on left seen posterior to the left hilum on image 84 of series 6 measuring 15 by 15 mm. These are diffusely distributed. Are some areas of mild debris along the right main bronchus. No pleural effusion or pneumothorax. Few subpleural blebs at the apices bilaterally. Upper Abdomen: Adrenal glands are grossly preserved. There appears to be some high density material or possible contrast in the colon. Presumed hepatic cysts. With the lung findings unless there are specific history contrast study of the chest abdomen pelvis may be of some benefit. Musculoskeletal: No chest wall mass or suspicious bone lesions identified. With the lung findings of whole-body bone scan may also be of benefit when clinically appropriate IMPRESSION: Multiple bilateral soft tissue lung nodules, up to 30 on the right and 25 on the left measuring up to 2 cm. Multiple areas of abnormal lymph nodes in the right thoracic inlet, mediastinum. Please correlate for any known history of malignancy or further workup is recommended Case to be discussed with the emergency room physician by myself on the afternoon of 07/07/2022 Electronically Signed: By: Jill Side M.D. On: 07/07/2022 16:18   DG Chest 2 View  Result Date: 07/07/2022 CLINICAL DATA:  One month of cough. EXAM: CHEST - 2 VIEW COMPARISON:  Chest radiograph February 09, 2020. FINDINGS: The heart size and mediastinal contours are within normal limits. Innumerable rounded solid bilateral lower lobe predominant pulmonary nodules. No visible  pleural effusion or pneumothorax. The visualized skeletal structures are unremarkable. IMPRESSION: Innumerable rounded solid bilateral, lower lobe predominant pulmonary nodules, highly suspicious for metastatic disease. Recommend further evaluation with chest CT. These results will be called to the ordering clinician or representative by the Radiologist Assistant, and communication documented in the PACS or [employer]. Electronically Signed   By: Dahlia Bailiff M.D.   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  We discussed the natural history of *** and general treatment, highlighting the role of radiotherapy in the management.  We discussed the available radiation techniques, and focused on the details of logistics and delivery.  We reviewed the anticipated acute and late sequelae associated with radiation in this setting.  The patient was encouraged to ask questions that I answered to the best of my ability. *** A patient consent form was discussed and signed.  We retained a copy for our records.  The patient would like to proceed with radiation and will be scheduled for CT simulation.  PLAN: ***    *** minutes of total time was spent for this patient encounter, including preparation, face-to-face counseling with the patient and coordination of care, physical exam, and documentation of the encounter.   ------------------------------------------------  Blair Promise, PhD, MD  This document serves as a record of services personally performed by Gery Pray, MD. It was created on his behalf by  Roney Mans, a trained medical scribe. The creation of this record is based on the scribe's personal observations and the provider's  statements to them. This document has been checked and approved by the attending provider.

## 2022-08-04 ENCOUNTER — Ambulatory Visit
Admission: RE | Admit: 2022-08-04 | Discharge: 2022-08-04 | Disposition: A | Discharge status: Home / Self Care | Payer: Medicaid Other | Source: Ambulatory Visit | Attending: Radiation Oncology | Admitting: Radiation Oncology

## 2022-08-04 ENCOUNTER — Encounter: Payer: BLUE CROSS/BLUE SHIELD | Admitting: Internal Medicine

## 2022-08-04 ENCOUNTER — Ambulatory Visit: Payer: Self-pay | Admitting: Genetic Counselor

## 2022-08-04 ENCOUNTER — Telehealth: Payer: Self-pay | Admitting: Internal Medicine

## 2022-08-04 ENCOUNTER — Ambulatory Visit: Payer: Medicaid Other | Admitting: Radiation Oncology

## 2022-08-04 ENCOUNTER — Encounter: Payer: Self-pay | Admitting: Genetic Counselor

## 2022-08-04 ENCOUNTER — Ambulatory Visit: Payer: Medicaid Other

## 2022-08-04 ENCOUNTER — Telehealth: Payer: Self-pay | Admitting: Genetic Counselor

## 2022-08-04 DIAGNOSIS — Z1379 Encounter for other screening for genetic and chromosomal anomalies: Secondary | ICD-10-CM | POA: Insufficient documentation

## 2022-08-04 DIAGNOSIS — R1900 Intra-abdominal and pelvic swelling, mass and lump, unspecified site: Secondary | ICD-10-CM

## 2022-08-04 NOTE — Telephone Encounter (Signed)
Good Morning,  Dr. Lorenso Courier  I called this patient this morning at 7:15 am she stated she had another appoitment scheduled today.  (She came in yesterday thing her appointment was 08-03-22)

## 2022-08-04 NOTE — Telephone Encounter (Signed)
Revealed negative genetic testing.  Discussed that we do not know why she has a pelvic mass. It could be due to a different gene that we are not testing, or maybe our current technology may not be able to pick something up.  It will be important for her to keep in contact with genetics to keep up with whether additional testing may be needed.

## 2022-08-04 NOTE — Progress Notes (Signed)
HPI:  Ms. Bottoms was previously seen in the [place] clinic due to a personal history of a pelvic mass and concerns regarding a hereditary predisposition to cancer. Please refer to our prior cancer genetics clinic note for more information regarding our discussion, assessment and recommendations, at the time. Ms. Dunk recent genetic test results were disclosed to her, as were recommendations warranted by these results. These results and recommendations are discussed in more detail below.  CANCER HISTORY:  Oncology History   No history exists.    FAMILY HISTORY:  We obtained a detailed, 4-generation family history.  Significant diagnoses are listed below: Family History  Problem Relation Age of Onset   Lung cancer Maternal Grandfather 55 - 69   Breast cancer Neg Hx    Ovarian cancer Neg Hx    Endometrial cancer Neg Hx    Prostate cancer Neg Hx    Pancreatic cancer Neg Hx        The patient has one son who is cancer free.  She has a sister who is cancer free.  Her mother is living and there is no information about her father or his family.   The patient's mother is living at 45.  She has one brother and one sister who are cancer free.  The maternal grandparents are deceased.   Ms. Flannigan is unaware of previous family history of genetic testing for hereditary cancer risks. Patient's maternal ancestors are of Caucasian descent, and paternal ancestors are of Native American descent. There is no reported Ashkenazi Jewish ancestry. There is no known consanguinity.  GENETIC TEST RESULTS: Genetic testing reported out on August 02, 2022 through the CancerNext-Expanded+RNAinsight cancer panel found no pathogenic mutations. The CancerNext-Expanded gene panel offered by [facility] and includes sequencing and rearrangement analysis for the following 77 genes: AIP, ALK, APC*, ATM*, AXIN2, BAP1, BARD1, BLM, BMPR1A, BRCA1*, BRCA2*, BRIP1*, CDC73, CDH1*, CDK4, CDKN1B, CDKN2A,  CHEK2*, CTNNA1, DICER1, FANCC, FH, FLCN, GALNT12, KIF1B, LZTR1, MAX, MEN1, MET, MLH1*, MSH2*, MSH3, MSH6*, MUTYH*, NBN, NF1*, NF2, NTHL1, PALB2*, PHOX2B, PMS2*, POT1, PRKAR1A, PTCH1, PTEN*, RAD51C*, RAD51D*, RB1, RECQL, RET, SDHA, SDHAF2, SDHB, SDHC, SDHD, SMAD4, SMARCA4, SMARCB1, SMARCE1, STK11, SUFU, TMEM127, TP53*, TSC1, TSC2, VHL and XRCC2 (sequencing and deletion/duplication); EGFR, EGLN1, HOXB13, KIT, MITF, PDGFRA, POLD1, and POLE (sequencing only); EPCAM and GREM1 (deletion/duplication only). DNA and RNA analyses performed for * genes. The test report has been scanned into EPIC and is located under the Molecular Pathology section of the Results Review tab.  A portion of the result report is included below for reference.     We discussed with Ms. Haywood that because current genetic testing is not perfect, it is possible there may be a gene mutation in one of these genes that current testing cannot detect, but that chance is small.  We also discussed, that there could be another gene that has not yet been discovered, or that we have not yet tested, that is responsible for the cancer diagnoses in the family. It is also possible there is a hereditary cause for the cancer in the family that Ms. Mcnew did not inherit and therefore was not identified in her testing.  Therefore, it is important to remain in touch with cancer genetics in the future so that we can continue to offer Ms. Gressman the most up to date genetic testing.   ADDITIONAL GENETIC TESTING: We discussed with Ms. Riggsbee that her genetic testing was fairly extensive.  If there are genes identified to increase  cancer risk that can be analyzed in the future, we would be happy to discuss and coordinate this testing at that time.    CANCER SCREENING RECOMMENDATIONS: Ms. Magnussen test result is considered negative (normal).  This means that we have not identified a hereditary cause for her personal history of a pelvic mass at this time. Most  cancers happen by chance and this negative test suggests that her cancer may fall into this category.    While reassuring, this does not definitively rule out a hereditary predisposition to cancer. It is still possible that there could be genetic mutations that are undetectable by current technology. There could be genetic mutations in genes that have not been tested or identified to increase cancer risk.  Therefore, it is recommended she continue to follow the cancer management and screening guidelines provided by her oncology and primary healthcare provider.   An individual's cancer risk and medical management are not determined by genetic test results alone. Overall cancer risk assessment incorporates additional factors, including personal medical history, family history, and any available genetic information that may result in a personalized plan for cancer prevention and surveillance  RECOMMENDATIONS FOR FAMILY MEMBERS:  Individuals in this family might be at some increased risk of developing cancer, over the general population risk, simply due to the family history of cancer.  We recommended women in this family have a yearly mammogram beginning at age 56, or 17 years younger than the earliest onset of cancer, an annual clinical breast exam, and perform monthly breast self-exams. Women in this family should also have a gynecological exam as recommended by their primary provider. All family members should be referred for colonoscopy starting at age 24.  FOLLOW-UP: Lastly, we discussed with Ms. Hartland that cancer genetics is a rapidly advancing field and it is possible that new genetic tests will be appropriate for her and/or her family members in the future. We encouraged her to remain in contact with cancer genetics on an annual basis so we can update her personal and family histories and let her know of advances in cancer genetics that may benefit this family.   Our contact number was provided. Ms.  Sulton questions were answered to her satisfaction, and she knows she is welcome to call us at anytime with additional questions or concerns.   Roma Kayser, [place], [facility] Licensed, Certified Genetic Counselor [email or web address]

## 2022-08-05 ENCOUNTER — Inpatient Hospital Stay: Payer: BLUE CROSS/BLUE SHIELD

## 2022-08-05 ENCOUNTER — Telehealth: Payer: Self-pay

## 2022-08-05 ENCOUNTER — Other Ambulatory Visit: Payer: Self-pay | Admitting: Hematology and Oncology

## 2022-08-05 ENCOUNTER — Telehealth: Payer: Self-pay | Admitting: Physician Assistant

## 2022-08-05 ENCOUNTER — Inpatient Hospital Stay: Payer: BLUE CROSS/BLUE SHIELD | Admitting: Hematology and Oncology

## 2022-08-05 DIAGNOSIS — C7951 Secondary malignant neoplasm of bone: Secondary | ICD-10-CM

## 2022-08-05 NOTE — Telephone Encounter (Signed)
I spoke to patient and her significant other, Natalie Hinton, to follow-up as she missed her EGD that was scheduled yesterday and her follow-up visit with medical oncology today.  Patient stated that she was confused with all the appointments that were scheduled.  Patient's significant other requested that he be contacted to confirm all appointments.  I emphasized the need to complete her workup including the EGD to finalize her diagnosis.  Dr. Narda Rutherford is unable to provide treatment recommendations without confirming her cancer primary.  I reviewed her upcoming consultation with radiation oncology on 08/09/2022.  I  will request GI to follow-up with the patient to reschedule EGD and we will make corresponding appointments with medical oncology.

## 2022-08-05 NOTE — Telephone Encounter (Signed)
Spoke with [employer]. He is helping the patient with her appointments. He will "try to get her there" at 10:30 am for an EGD at 11:30 am. He declined an earlier appointment at 9:30 am because "I got to get a cab and they are really busy at that time."

## 2022-08-05 NOTE — Telephone Encounter (Signed)
Spoke with pt to let her know that she will have to get her oxycodone from her oncologist. Pt understood

## 2022-08-05 NOTE — Telephone Encounter (Signed)
Patients spouse called [facility] reporting that patient is "talking out of her head". He states when he calls patient she responds, but she seems disoriented. She seems confused and is mixing up times. This RN strongly advised that patient be evaluated at the ER as new onset of cognitive impairment is an emergency. Patient's spouse verbalized understanding and thanks and will have patient evaluated at the ER.

## 2022-08-06 ENCOUNTER — Encounter: Payer: Self-pay | Admitting: Family Medicine

## 2022-08-06 ENCOUNTER — Telehealth: Payer: Self-pay | Admitting: Hematology and Oncology

## 2022-08-06 ENCOUNTER — Ambulatory Visit (AMBULATORY_SURGERY_CENTER): Payer: Medicaid Other | Admitting: Internal Medicine

## 2022-08-06 ENCOUNTER — Encounter: Payer: Self-pay | Admitting: Internal Medicine

## 2022-08-06 VITALS — BP 101/65 | HR 103 | Temp 99.6°F | Resp 20 | Ht 60.0 in | Wt 84.0 lb

## 2022-08-06 DIAGNOSIS — K2970 Gastritis, unspecified, without bleeding: Secondary | ICD-10-CM

## 2022-08-06 DIAGNOSIS — K2971 Gastritis, unspecified, with bleeding: Secondary | ICD-10-CM | POA: Diagnosis present

## 2022-08-06 DIAGNOSIS — R112 Nausea with vomiting, unspecified: Secondary | ICD-10-CM

## 2022-08-06 DIAGNOSIS — K319 Disease of stomach and duodenum, unspecified: Secondary | ICD-10-CM | POA: Diagnosis not present

## 2022-08-06 LAB — POCT URINE PREGNANCY: Preg Test, Ur: NEGATIVE

## 2022-08-06 MED ORDER — OMEPRAZOLE 40 MG PO CPDR: 40.0000 mg | DELAYED_RELEASE_CAPSULE | Freq: Two times a day (BID) | ORAL | 3 refills | Status: DC

## 2022-08-06 MED ORDER — SODIUM CHLORIDE 0.9 % IV SOLN: 500.0000 mL | Freq: Once | INTRAVENOUS | Status: DC

## 2022-08-06 NOTE — Progress Notes (Signed)
Report to pacu rn. BP low upon admission. '10mg'$  Ephedrine given by CRNA. Vss afterwards and patient starting to emerge. Care resumed by rn.

## 2022-08-06 NOTE — Op Note (Addendum)
[place] Patient Name: Natalie Hinton [facility] Procedure Date: 08/06/2022 11:58 AM MRN: [id number] Endoscopist: Georgian Co , , [id number] Age: 37 Referring MD:  Date of Birth: [birth year] Gender: Female Account #: [id number] Procedure:                Upper GI endoscopy Indications:              Poorly differentiated carcinoma of unclear etiology Medicines:                Monitored Anesthesia Care Procedure:                Pre-Anesthesia Assessment:                           - Prior to the procedure, a History and Physical                            was performed, and patient medications and                            allergies were reviewed. The patient's tolerance of                            previous anesthesia was also reviewed. The risks                            and benefits of the procedure and the sedation                            options and risks were discussed with the patient.                            All questions were answered, and informed consent                            was obtained. Prior Anticoagulants: The patient has                            taken no anticoagulant or antiplatelet agents. ASA                            Grade Assessment: II - A patient with mild systemic                            disease. After reviewing the risks and benefits,                            the patient was deemed in satisfactory condition to                            undergo the procedure.                           After obtaining informed consent, the endoscope was  passed under direct vision. Throughout the                            procedure, the patient's blood pressure, pulse, and                            oxygen saturations were monitored continuously. The                            GIF [id number] #[id number] was introduced through the                            mouth, and advanced to the second part of duodenum.                             The upper GI endoscopy was accomplished without                            difficulty. The patient tolerated the procedure                            well. Scope In: Scope Out: Findings:                 The examined esophagus was normal.                           Diffuse and scattered inflammation with hemorrhage                            characterized by congestion (edema), erosions,                            erythema and friability was found in the gastric                            body and in the gastric antrum. Biopsies were taken                            with a cold forceps for histology.                           The examined duodenum was normal. Complications:            No immediate complications. Estimated Blood Loss:     Estimated blood loss was minimal. Impression:               - Normal esophagus.                           - Gastritis with hemorrhage. Biopsied.                           - Normal examined duodenum. Recommendation:           - Discharge patient to home (with escort).                           -  No obvious source of cancer was identified. We                            will notify your oncologist of these results.                           - Await pathology results.                           - The findings and recommendations were discussed                            with the patient.                           - Use Prilosec (omeprazole) 40 mg PO BID for 8                            weeks. Dr Georgian Co [address]" [place],  08/06/2022 12:29:38 PM

## 2022-08-06 NOTE — Telephone Encounter (Signed)
Called patient per 2/2 secure chat to schedule appointments for 2/5 before other appointments. Left voicemail with appointment information and call back number.

## 2022-08-06 NOTE — Progress Notes (Signed)
GASTROENTEROLOGY PROCEDURE H&P NOTE   Primary Care Physician: [name], MD    Reason for Procedure:   Metastatic poorly differentiated carcinoma  Plan:    EGD  Patient is appropriate for endoscopic procedure(s) in the ambulatory ([facility]) setting.  The nature of the procedure, as well as the risks, benefits, and alternatives were carefully and thoroughly reviewed with the patient. Ample time for discussion and questions allowed. The patient understood, was satisfied, and agreed to proceed.     HPI: Natalie Hinton is a 37 y.o. female who presents for EGD for evaluation of metastatic poorly differentiated carcinoma .  Patient was most recently seen in the Gastroenterology Clinic on 08/02/22.  No interval change in medical history since that appointment. Please refer to that note for full details regarding GI history and clinical presentation.   Past Medical History:  Diagnosis Date   Thyroid disease     No past surgical history on file.  Prior to Admission medications   Medication Sig Start Date End Date Taking? Authorizing Provider  gabapentin (NEURONTIN) 300 MG capsule Take 1 capsule (300 mg total) by mouth 2 (two) times daily. 07/28/22   Lincoln Brigham, PA-C  ondansetron (ZOFRAN-ODT) 8 MG disintegrating tablet Take 1 tablet (8 mg total) by mouth every 8 (eight) hours as needed for nausea or vomiting. 08/02/22   Vladimir Crofts, PA-C  oxyCODONE 10 MG TABS Take 0.5 tablets (5 mg total) by mouth every 4 (four) hours as needed for severe pain. 07/20/22   Lincoln Brigham, PA-C  promethazine (PHENERGAN) 12.5 MG suppository Place 1 suppository (12.5 mg total) rectally every 8 (eight) hours as needed for nausea or vomiting. 08/02/22   Vladimir Crofts, PA-C    Current Outpatient Medications  Medication Sig Dispense Refill   gabapentin (NEURONTIN) 300 MG capsule Take 1 capsule (300 mg total) by mouth 2 (two) times daily. 60 capsule 0   ondansetron (ZOFRAN-ODT) 8 MG  disintegrating tablet Take 1 tablet (8 mg total) by mouth every 8 (eight) hours as needed for nausea or vomiting. 20 tablet 0   oxyCODONE 10 MG TABS Take 0.5 tablets (5 mg total) by mouth every 4 (four) hours as needed for severe pain. 90 tablet 0   promethazine (PHENERGAN) 12.5 MG suppository Place 1 suppository (12.5 mg total) rectally every 8 (eight) hours as needed for nausea or vomiting. 12 suppository 0   Current Facility-Administered Medications  Medication Dose Route Frequency Provider Last Rate Last Admin   0.9 %  sodium chloride infusion  500 mL Intravenous Once Sharyn Creamer, MD        Allergies as of 08/06/2022 - Review Complete 08/02/2022  Allergen Reaction Noted   Penicillins  12/19/2019    Family History  Problem Relation Age of Onset   Lung cancer Maternal Grandfather 39 - 69   Breast cancer Neg Hx    Ovarian cancer Neg Hx    Endometrial cancer Neg Hx    Prostate cancer Neg Hx    Pancreatic cancer Neg Hx     Social History   Socioeconomic History   Marital status: Widowed    Spouse name: Not on file   Number of children: 1   Years of education: Not on file   Highest education level: Not on file  Occupational History   Not on file  Tobacco Use   Smoking status: Every Day    Packs/day: 0.50    Years: 20.00    Total pack  years: 10.00    Types: Cigarettes    Start date: 2004   Smokeless tobacco: Never  Vaping Use   Vaping Use: Never used  Substance and Sexual Activity   Alcohol use: Never   Drug use: Yes    Types: Marijuana   Sexual activity: Yes    Partners: Male    Birth control/protection: None  Other Topics Concern   Not on file  Social History Narrative   1 son live w/gma      Unem.   Social Determinants of Health   Financial Resource Strain: Not on file  Food Insecurity: Not on file  Transportation Needs: Not on file  Physical Activity: Not on file  Stress: Not on file  Social Connections: Not on file  Intimate Partner Violence: Not  on file    Physical Exam: Vital signs in last 24 hours: BP (!) 104/53   Pulse (!) 122   Temp 99.6 F (37.6 C) (Skin)   Ht 5' (1.524 m)   Wt 84 lb (38.1 kg)   LMP 04/04/2022 (Approximate)   SpO2 97%   BMI 16.41 kg/m  GEN: NAD EYE: Sclerae anicteric ENT: MMM CV: Non-tachycardic Pulm: No increased WOB GI: Soft NEURO:  Alert & Oriented   Christia Reading, MD [facility]   08/06/2022 11:20 AM

## 2022-08-06 NOTE — Progress Notes (Signed)
Called to room to assist during endoscopic procedure.  Patient ID and intended procedure confirmed with present staff. Received instructions for my participation in the procedure from the performing physician.  

## 2022-08-06 NOTE — Patient Instructions (Signed)
Thank you for coming in to see [place] today. Return to your normal activities tomorrow. New prescription sent to your pharmacy, you will take it twice per day to help with stomach inflammation.   YOU HAD AN ENDOSCOPIC PROCEDURE TODAY AT [facility]:   Refer to the procedure report that was given to you for any specific questions about what was found during the examination.  If the procedure report does not answer your questions, please call your gastroenterologist to clarify.  If you requested that your care partner not be given the details of your procedure findings, then the procedure report has been included in a sealed envelope for you to review at your convenience later.  YOU SHOULD EXPECT: Some feelings of bloating in the abdomen. Passage of more gas than usual.  Walking can help get rid of the air that was put into your GI tract during the procedure and reduce the bloating. If you had a lower endoscopy (such as a colonoscopy or flexible sigmoidoscopy) you may notice spotting of blood in your stool or on the toilet paper. If you underwent a bowel prep for your procedure, you may not have a normal bowel movement for a few days.  Please Note:  You might notice some irritation and congestion in your nose or some drainage.  This is from the oxygen used during your procedure.  There is no need for concern and it should clear up in a day or so.  SYMPTOMS TO REPORT IMMEDIATELY:    Following upper endoscopy (EGD)  Vomiting of blood or coffee ground material  New chest pain or pain under the shoulder blades  Painful or persistently difficult swallowing  New shortness of breath  Fever of 100F or higher  Black, tarry-looking stools  For urgent or emergent issues, a gastroenterologist can be reached at any hour by calling [phone]. Do not use MyChart messaging for urgent concerns.    DIET:  We do recommend a small meal at first, but then you may proceed to your regular diet.   Drink plenty of fluids but you should avoid alcoholic beverages for 24 hours.  ACTIVITY:  You should plan to take it easy for the rest of today and you should NOT DRIVE or use heavy machinery until tomorrow (because of the sedation medicines used during the test).    FOLLOW UP: Our staff will call the number listed on your records the next business day following your procedure.  We will call around 7:15- 8:00 am to check on you and address any questions or concerns that you may have regarding the information given to you following your procedure. If we do not reach you, we will leave a message.     If any biopsies were taken you will be contacted by phone or by letter within the next 1-3 weeks.  Please call us at [phone] if you have not heard about the biopsies in 3 weeks.    SIGNATURES/CONFIDENTIALITY: You and/or your care partner have signed paperwork which will be entered into your electronic medical record.  These signatures attest to the fact that that the information above on your After Visit Summary has been reviewed and is understood.  Full responsibility of the confidentiality of this discharge information lies with you and/or your care-partner.

## 2022-08-07 ENCOUNTER — Encounter (HOSPITAL_COMMUNITY): Payer: Self-pay

## 2022-08-07 ENCOUNTER — Emergency Department (HOSPITAL_COMMUNITY): Payer: Medicaid Other

## 2022-08-07 ENCOUNTER — Other Ambulatory Visit: Payer: Self-pay

## 2022-08-07 ENCOUNTER — Inpatient Hospital Stay (HOSPITAL_COMMUNITY)
Admission: EM | Admit: 2022-08-07 | Discharge: 2022-08-16 | DRG: 054 | Disposition: A | Discharge status: Home Health | Payer: Medicaid Other | Attending: Internal Medicine | Admitting: Internal Medicine

## 2022-08-07 DIAGNOSIS — K5900 Constipation, unspecified: Secondary | ICD-10-CM | POA: Diagnosis present

## 2022-08-07 DIAGNOSIS — R54 Age-related physical debility: Secondary | ICD-10-CM | POA: Diagnosis present

## 2022-08-07 DIAGNOSIS — C7989 Secondary malignant neoplasm of other specified sites: Secondary | ICD-10-CM | POA: Diagnosis present

## 2022-08-07 DIAGNOSIS — G893 Neoplasm related pain (acute) (chronic): Secondary | ICD-10-CM | POA: Diagnosis present

## 2022-08-07 DIAGNOSIS — E876 Hypokalemia: Secondary | ICD-10-CM | POA: Diagnosis present

## 2022-08-07 DIAGNOSIS — F17210 Nicotine dependence, cigarettes, uncomplicated: Secondary | ICD-10-CM | POA: Diagnosis present

## 2022-08-07 DIAGNOSIS — I6523 Occlusion and stenosis of bilateral carotid arteries: Secondary | ICD-10-CM | POA: Diagnosis present

## 2022-08-07 DIAGNOSIS — G9340 Encephalopathy, unspecified: Secondary | ICD-10-CM | POA: Diagnosis present

## 2022-08-07 DIAGNOSIS — E871 Hypo-osmolality and hyponatremia: Secondary | ICD-10-CM | POA: Diagnosis present

## 2022-08-07 DIAGNOSIS — R443 Hallucinations, unspecified: Secondary | ICD-10-CM

## 2022-08-07 DIAGNOSIS — C771 Secondary and unspecified malignant neoplasm of intrathoracic lymph nodes: Secondary | ICD-10-CM | POA: Diagnosis present

## 2022-08-07 DIAGNOSIS — Z8589 Personal history of malignant neoplasm of other organs and systems: Principal | ICD-10-CM

## 2022-08-07 DIAGNOSIS — C7802 Secondary malignant neoplasm of left lung: Secondary | ICD-10-CM | POA: Diagnosis present

## 2022-08-07 DIAGNOSIS — R29810 Facial weakness: Secondary | ICD-10-CM | POA: Diagnosis present

## 2022-08-07 DIAGNOSIS — G529 Cranial nerve disorder, unspecified: Secondary | ICD-10-CM | POA: Diagnosis present

## 2022-08-07 DIAGNOSIS — R000 Tachycardia, unspecified: Secondary | ICD-10-CM | POA: Diagnosis present

## 2022-08-07 DIAGNOSIS — C7801 Secondary malignant neoplasm of right lung: Secondary | ICD-10-CM | POA: Diagnosis present

## 2022-08-07 DIAGNOSIS — R683 Clubbing of fingers: Secondary | ICD-10-CM | POA: Diagnosis present

## 2022-08-07 DIAGNOSIS — C7931 Secondary malignant neoplasm of brain: Secondary | ICD-10-CM | POA: Diagnosis not present

## 2022-08-07 DIAGNOSIS — G928 Other toxic encephalopathy: Secondary | ICD-10-CM | POA: Diagnosis present

## 2022-08-07 DIAGNOSIS — R4701 Aphasia: Secondary | ICD-10-CM | POA: Diagnosis present

## 2022-08-07 DIAGNOSIS — R64 Cachexia: Secondary | ICD-10-CM | POA: Diagnosis present

## 2022-08-07 DIAGNOSIS — E43 Unspecified severe protein-calorie malnutrition: Secondary | ICD-10-CM | POA: Diagnosis present

## 2022-08-07 DIAGNOSIS — C799 Secondary malignant neoplasm of unspecified site: Secondary | ICD-10-CM | POA: Diagnosis present

## 2022-08-07 DIAGNOSIS — R410 Disorientation, unspecified: Secondary | ICD-10-CM

## 2022-08-07 DIAGNOSIS — C7951 Secondary malignant neoplasm of bone: Secondary | ICD-10-CM | POA: Diagnosis present

## 2022-08-07 DIAGNOSIS — D72828 Other elevated white blood cell count: Secondary | ICD-10-CM | POA: Diagnosis present

## 2022-08-07 DIAGNOSIS — C801 Malignant (primary) neoplasm, unspecified: Secondary | ICD-10-CM | POA: Diagnosis present

## 2022-08-07 DIAGNOSIS — Z681 Body mass index (BMI) 19 or less, adult: Secondary | ICD-10-CM

## 2022-08-07 DIAGNOSIS — R509 Fever, unspecified: Secondary | ICD-10-CM | POA: Insufficient documentation

## 2022-08-07 DIAGNOSIS — Z801 Family history of malignant neoplasm of trachea, bronchus and lung: Secondary | ICD-10-CM

## 2022-08-07 DIAGNOSIS — Z66 Do not resuscitate: Secondary | ICD-10-CM | POA: Diagnosis present

## 2022-08-07 LAB — COMPREHENSIVE METABOLIC PANEL
ALT: 23 U/L (ref 0–44)
AST: 37 U/L (ref 15–41)
Albumin: 2.6 g/dL — ABNORMAL LOW (ref 3.5–5.0)
Alkaline Phosphatase: 65 U/L (ref 38–126)
Anion gap: 13 (ref 5–15)
BUN: 7 mg/dL (ref 6–20)
CO2: 25 mmol/L (ref 22–32)
Calcium: 9.4 mg/dL (ref 8.9–10.3)
Chloride: 92 mmol/L — ABNORMAL LOW (ref 98–111)
Creatinine, Ser: 0.52 mg/dL (ref 0.44–1.00)
GFR, Estimated: >60 mL/min (ref >60)
Glucose, Bld: 79 mg/dL (ref 70–99)
Potassium: 2.9 mmol/L — ABNORMAL LOW (ref 3.5–5.1)
Sodium: 130 mmol/L — ABNORMAL LOW (ref 135–145)
Total Bilirubin: 1.2 mg/dL (ref 0.3–1.2)
Total Protein: 7 g/dL (ref 6.5–8.1)

## 2022-08-07 LAB — CBC
HCT: 34.3 % — ABNORMAL LOW (ref 36.0–46.0)
Hemoglobin: 11.5 g/dL — ABNORMAL LOW (ref 12.0–15.0)
MCH: 28.7 pg (ref 26.0–34.0)
MCHC: 33.5 g/dL (ref 30.0–36.0)
MCV: 85.5 fL (ref 80.0–100.0)
Platelets: 499 10*3/uL — ABNORMAL HIGH (ref 150–400)
RBC: 4.01 MIL/uL (ref 3.87–5.11)
RDW: 14.6 % (ref 11.5–15.5)
WBC: 13.9 10*3/uL — ABNORMAL HIGH (ref 4.0–10.5)
nRBC: 0 % (ref 0.0–0.2)

## 2022-08-07 LAB — CBG MONITORING, ED: Glucose-Capillary: 80 mg/dL (ref 70–99)

## 2022-08-07 LAB — I-STAT BETA HCG BLOOD, ED (MC, WL, AP ONLY): I-stat hCG, quantitative: <5 m[IU]/mL (ref <5)

## 2022-08-07 LAB — AMMONIA: Ammonia: 26 umol/L (ref 9–35)

## 2022-08-07 LAB — VITAMIN B12: Vitamin B-12: 1315 pg/mL — ABNORMAL HIGH (ref 180–914)

## 2022-08-07 LAB — FOLATE: Folate: 10.9 ng/mL (ref >5.9)

## 2022-08-07 LAB — ETHANOL: Alcohol, Ethyl (B): <10 mg/dL (ref <10)

## 2022-08-07 MED ORDER — POTASSIUM CHLORIDE 10 MEQ/100ML IV SOLN: 10.0000 meq | INTRAVENOUS | Status: AC

## 2022-08-07 MED ORDER — POTASSIUM CHLORIDE CRYS ER 20 MEQ PO TBCR
40.0000 meq | EXTENDED_RELEASE_TABLET | Freq: Once | ORAL | Status: AC
  Administered 2022-08-07: 40 meq via ORAL
  Filled 2022-08-07: qty 2

## 2022-08-07 MED ORDER — FOLIC ACID 1 MG PO TABS
1.0000 mg | ORAL_TABLET | Freq: Every day | ORAL | Status: DC
  Administered 2022-08-08 – 2022-08-16 (×9): 1 mg via ORAL
  Filled 2022-08-07 (×10): qty 1

## 2022-08-07 MED ORDER — LORAZEPAM 1 MG PO TABS
0.5000 mg | ORAL_TABLET | ORAL | Status: AC
  Administered 2022-08-08: 0.5 mg via ORAL
  Filled 2022-08-07: qty 1

## 2022-08-07 MED ORDER — IOHEXOL 350 MG/ML SOLN
75.0000 mL | Freq: Once | INTRAVENOUS | Status: AC | PRN
  Administered 2022-08-07: 75 mL via INTRAVENOUS

## 2022-08-07 MED ORDER — THIAMINE HCL 100 MG/ML IJ SOLN
500.0000 mg | Freq: Three times a day (TID) | INTRAVENOUS | Status: DC
  Filled 2022-08-07 (×2): qty 5

## 2022-08-07 MED ORDER — OXYCODONE HCL 5 MG PO TABS
10.0000 mg | ORAL_TABLET | ORAL | Status: AC
  Administered 2022-08-07: 10 mg via ORAL
  Filled 2022-08-07: qty 2

## 2022-08-07 MED ORDER — ADULT MULTIVITAMIN W/MINERALS CH
1.0000 | ORAL_TABLET | Freq: Every day | ORAL | Status: DC
  Administered 2022-08-08 – 2022-08-16 (×9): 1 via ORAL
  Filled 2022-08-07 (×9): qty 1

## 2022-08-07 NOTE — ED Notes (Signed)
Patient transported to CT 

## 2022-08-07 NOTE — Consult Note (Signed)
Neurology Consultation Reason for Consult: Cranial neuropathies  Requesting Physician: Addison Lank  CC: Confusion and eye movement abnormalities   History is obtained from: Chart review and fiance at bedside   HPI: Kadisha M Bixby is a 37 y.o. female with a past medical history significant for poorly differentiated carcinoma, metastatic involving the thorax, pulmonary nodules, mediastinal lymph nodes, as well as pelvic mass  Further bedside reports that she started pain medications approximately 10 days ago.  2 days ago she was very confused and he attributed this to pain medications and stopped giving them to her.  Unfortunately her confusion has not cleared in the setting.  He also notes that she is having headaches which wake her up at night and improves when she is more upright, estimating these have been ongoing for the past 3 weeks or so.  She has had very poor appetite with some nausea and vomiting.  This had improved before but now is worsening again.  He feels that the changes in her face and eyes that started approximately 2 days ago as well.  She has been having significant neuropathic pain in the bilateral legs and feet in particular  She is being followed by oncology and radiation oncology for her metastatic disease of unknown primary at this time.  I recommended an MRI brain with and without contrast but she did not tolerate the scan secondary to pain  ROS: Unable to obtain due to altered mental status.   Past Medical History:  Diagnosis Date   Allergy    Anxiety    Thyroid disease    History reviewed. No pertinent surgical history.  Current Outpatient Medications  Medication Instructions   omeprazole (PRILOSEC) 40 mg, Oral, 2 times daily before meals   ondansetron (ZOFRAN-ODT) 8 mg, Oral, Every 8 hours PRN   Oxycodone HCl 5 mg, Oral, Every 4 hours PRN   promethazine (PHENERGAN) 12.5 mg, Rectal, Every 8 hours PRN    Family History  Problem Relation Age of Onset    Lung cancer Maternal Grandfather 27 - 69   Breast cancer Neg Hx    Ovarian cancer Neg Hx    Endometrial cancer Neg Hx    Prostate cancer Neg Hx    Pancreatic cancer Neg Hx    Colon cancer Neg Hx     Social History:  reports that she has been smoking cigarettes. She started smoking about 20 years ago. She has a 10.00 pack-year smoking history. She has never used smokeless tobacco. She reports current drug use. Drug: Marijuana. She reports that she does not drink alcohol.   Exam: Current vital signs: BP 93/79   Pulse (!) 104   Temp 98.2 F (36.8 C) (Oral)   Resp (!) 21   Ht 5' (1.524 m)   Wt 31.8 kg   SpO2 97%   BMI 13.67 kg/m  Vital signs in last 24 hours: Temp:  [98.2 F (36.8 C)] 98.2 F (36.8 C) (02/03 1939) Pulse Rate:  [104] 104 (02/03 1939) Resp:  [16-21] 21 (02/03 2200) BP: (93-125)/(79-85) 93/79 (02/03 2200) SpO2:  [97 %-100 %] 97 % (02/03 1939) Weight:  [31.8 kg] 31.8 kg (02/03 1941)   Physical Exam  Constitutional: Appears cachectic Psych: Affect generally pleasant, occasionally irritable.  Poor attention/concentration Eyes: No scleral injection HENT: No oropharyngeal obstruction.  MSK: Clubbing of the fingers bilaterally.  Some swelling of the distal phalanx bilaterally Cardiovascular: Tachycardia with regular rhythm.  Perfusing extremities well Respiratory: Effort normal, non-labored breathing Skin: Red skin  changes on the neck and anterior chest wall.  Some scattered red marks throughout the skin  Neuro: Mental Status: Patient is awake, alert, oriented to person, but otherwise disoriented reporting she is 37 years old and unable to state location She does appear to have some aphasia, naming thumb as toe and pinky as index finger.  Repetition intact.  Occasionally speech is nonsensical.  Poor attention/concentration Cranial Nerves: II: Pupils are equal, round, and reactive to light, 4 mm to 2 mm III,IV, VI: Globally restricted movements of the right eye  with ptosis of the right eye.  Left eye with nystagmus on right gaze, intermittently restricted lateral gaze.  V: Facial sensation is reduced in the right V3 or mental nerve distribution VII: Facial movement is notable for reduced blink rate on the left and left lower facial droop, sparing the forehead VIII: hearing is intact to voice X: Uvula elevates symmetrically XI: Shoulder shrug is symmetric. XII: tongue is midline without atrophy or fasciculations.  Motor: Diffusely cachectic.  She uses all 4 extremities spontaneously equally.  She is too confused to perform confrontational strength testing. Sensory: Sensation is symmetric to light touch and temperature in the arms and legs.  Significant allodynia to touch of bilateral feet Cerebellar: Unable to assess secondary to patient's mental status  Gait:  Deferred for safety  NIHSS total 10 Score breakdown: 2 points for not answering questions appropriately, 1-point for gaze palsy, 2 points for left facial droop, 1 point for weakness in each of the limbs, 1 point for aphasia Performed at 2:30 AM   I have reviewed labs in epic and the results pertinent to this consultation are:  Basic Metabolic Panel: Recent Labs  Lab 08/07/22 2123  NA 130*  K 2.9*  CL 92*  CO2 25  GLUCOSE 79  BUN 7  CREATININE 0.52  CALCIUM 9.4    CBC: Recent Labs  Lab 08/07/22 2123  WBC 13.9*  HGB 11.5*  HCT 34.3*  MCV 85.5  PLT 499*    Coagulation Studies: No results for input(s): "LABPROT", "INR" in the last 72 hours.    I have reviewed the images obtained:    CTA head and neck personally reviewed, agree with radiology:   1. 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 No proximal large vessel occlusion. 2. No cervical arterial occlusion or stenosis. 3. Bulky necrotic mediastinal lymphadenopathy and numerous large bilateral pulmonary metastases. 4. Partially necrotic right supraclavicular lymph node,  likely metastatic.   Impression: History of subacute worsening headaches which wake the patient at night is concerning for elevated intracranial pressure, especially with worsening nausea and emesis.  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  Overlaid on this is some aphasia; MRI will be more sensitive test to pick up potential metastatic disease then head CT (both for potential intraparenchymal metastases as well as leptomeningeal carcinomatosis).  If MRI is negative, lumbar puncture specifically with cytopathology may be helpful in confirming the diagnosis, although at times multiple lumbar punctures are required to isolate malignant cells.  Additionally overlaid is delirium likely secondary to pain and poor sleep for several days in addition to her metastatic disease and electrolyte derangements  Curbside recommendations: -MRI brain and cervical spine with and without contrast -Stat thiamine  level, then 500 mg IV thiamine every 8 hours once lab is collected x 9 doses, agree with B12 and folate level check -Routine EEG -If MRI is unrevealing, consider lumbar puncture (with opening pressure, cell counts in tubes 2 and 4, protein, glucose, bacterial smear/culture, fungal smear/culture meningitis/encephalitis panel, but with the first tube sent with a full volume CSF for cytopathology) -Appreciate involvement of oncology and consideration of how these studies may impact treatment options and goals of care -Appreciate pain control per primary team which may help patient tolerate MRI scan; discussed with family at bedside that while pain medications can result in delirium, uncontrolled pain is also deliriogenic -Appreciate management of  comorbidities per primary team  Lesleigh Noe MD-PhD [facility] [phone] Available 7 PM to 7 AM, outside of these hours please call Neurologist on call as listed on Amion.

## 2022-08-07 NOTE — ED Triage Notes (Signed)
Patient Natalie Hinton after family called to report hallucinations after taking too much gabapentin a few days ago. Patient is alert and oriented x4. She has not been eating or drinking and has lost 30 pounds the past month. She has been diagnosed with cervical cancer and has not started treatments yet. She had a EGD yesterday where they found an infection in her stomach. Today she is weak, and having some hallucinations, Hinton reports she talks to herself randomly.

## 2022-08-07 NOTE — ED Provider Notes (Signed)
[facility] Provider Note   CSN: [id number] Arrival date & time: 08/07/22  1934     History  Chief Complaint  Patient presents with   Altered Mental Status    Natalie Hinton is a 37 y.o. female.  37 year old female with a history of metastatic cancer of unknown primary presents to the emergency department with hallucinations.  History obtained per the patient and her significant other.  They report that she has been having hallucinations over the past 2 days.  Describes them as being both visual and auditory.  Says it is typically loved ones that she will see her here.  No SI or HI.  Is not being told to do anything by voices.  No psychiatric history.  Does smoke marijuana but denies any other substances.  No alcohol use.  Has lost significant amount of weight due to her cancer diagnosis.   Husband is also manage the patient appears peroxide occasionally.  Patient reports that she has had a right right-sided headache for several days as well.  Denies any neck stiffness or fevers.  No seizures.       Home Medications Prior to Admission medications   Medication Sig Start Date End Date Taking? Authorizing Provider  ondansetron (ZOFRAN-ODT) 8 MG disintegrating tablet Take 1 tablet (8 mg total) by mouth every 8 (eight) hours as needed for nausea or vomiting. 08/02/22  Yes Vladimir Crofts, PA-C  omeprazole (PRILOSEC) 40 MG capsule Take 1 capsule (40 mg total) by mouth 2 (two) times daily before a meal. Patient not taking: Reported on 08/07/2022 08/06/22   Sharyn Creamer, MD  oxyCODONE 10 MG TABS Take 0.5 tablets (5 mg total) by mouth every 4 (four) hours as needed for severe pain. Patient not taking: Reported on 08/06/2022 07/20/22   Lincoln Brigham, PA-C  promethazine (PHENERGAN) 12.5 MG suppository Place 1 suppository (12.5 mg total) rectally every 8 (eight) hours as needed for nausea or vomiting. Patient not taking: Reported on 08/07/2022 08/02/22    Vladimir Crofts, PA-C      Allergies    Penicillins and Gabapentin    Review of Systems   Review of Systems  Physical Exam Updated Vital Signs BP 121/73 (BP Location: Left Arm)   Pulse (!) 115   Temp 98.4 F (36.9 C) (Oral)   Resp 18   Ht 5' (1.524 m)   Wt 31.8 kg   SpO2 100%   BMI 13.67 kg/m  Physical Exam Vitals and nursing note reviewed.  Constitutional:      General: She is not in acute distress.    Appearance: She is well-developed.  HENT:     Head: Normocephalic and atraumatic.     Right Ear: External ear normal.     Left Ear: External ear normal.     Mouth/Throat:     Mouth: Mucous membranes are moist.     Pharynx: Oropharynx is clear.  Eyes:     Conjunctiva/sclera: Conjunctivae normal.  Cardiovascular:     Rate and Rhythm: Regular rhythm. Tachycardia present.     Heart sounds: No murmur heard. Pulmonary:     Effort: Pulmonary effort is normal. No respiratory distress.     Breath sounds: Normal breath sounds.  Abdominal:     General: There is no distension.     Palpations: Abdomen is soft. There is no mass.     Tenderness: There is no abdominal tenderness. There is no guarding.  Musculoskeletal:  General: No swelling.     Cervical back: Neck supple.  Skin:    General: Skin is warm and dry.     Capillary Refill: Capillary refill takes less than 2 seconds.  Neurological:     Mental Status: She is alert.     Comments: MENTAL STATUS: AAOx3 CRANIAL NERVES: II: No visual field deficits, right pupil 2 mm and sluggishly reactive, left pupil 4 mm,  III, IV, VI: unable to abduct right eye V: normal sensation to light touch in V1, V2, and V3 segments bilaterally VII: Proptosis of the right eye VIII: normal hearing to speech and finger friction IX, X: normal palatal elevation, no uvular deviation XI: 5/5 head turn and 5/5 shoulder shrug bilaterally XII: midline tongue protrusion MOTOR: 5/5 strength in R shoulder flexion, elbow flexion and extension,  and grip strength. 5/5 strength in L shoulder flexion, elbow flexion and extension, and grip strength.  Right lower extremity exam limited due to pain.  Left lower extremity full strength. SENSORY: Normal sensation to light touch in all extremities COORD: Normal finger to nose and heel to shin, no tremor, no dysmetria STATION: No truncal ataxia   Psychiatric:        Mood and Affect: Mood normal.     ED Results / Procedures / Treatments   Labs (all labs ordered are listed, but only abnormal results are displayed) Labs Reviewed  COMPREHENSIVE METABOLIC PANEL - Abnormal; Notable for the following components:      Result Value   Sodium 130 (*)    Potassium 2.9 (*)    Chloride 92 (*)    Albumin 2.6 (*)    All other components within normal limits  CBC - Abnormal; Notable for the following components:   WBC 13.9 (*)    Hemoglobin 11.5 (*)    HCT 34.3 (*)    Platelets 499 (*)    All other components within normal limits  VITAMIN B12 - Abnormal; Notable for the following components:   Vitamin B-12 1,315 (*)    All other components within normal limits  CBC - Abnormal; Notable for the following components:   WBC 13.0 (*)    Hemoglobin 11.2 (*)    HCT 33.0 (*)    Platelets 518 (*)    All other components within normal limits  BASIC METABOLIC PANEL - Abnormal; Notable for the following components:   Sodium 133 (*)    Chloride 95 (*)    BUN <5 (*)    All other components within normal limits  AMMONIA  ETHANOL  FOLATE  MAGNESIUM  HIV ANTIBODY (ROUTINE TESTING W REFLEX)  MAGNESIUM  PHOSPHORUS  URINALYSIS, ROUTINE W REFLEX MICROSCOPIC  RAPID URINE DRUG SCREEN, HOSP PERFORMED  VITAMIN B1  CBG MONITORING, ED  I-STAT BETA HCG BLOOD, ED ([facility], [facility], [facility] ONLY)    EKG None  Radiology EEG adult  Result Date: 08/08/2022 Lora Havens, MD     08/08/2022  8:23 AM Patient Name: Natalie Hinton MRN: [id number] Epilepsy Attending: Lora Havens Referring Physician/Provider: Lorenza Chick, MD Date: 08/08/2022 Duration: 28.23 mins Patient history: 37yo F with ams. EEG to evaluate for seizure Level of alertness: Awake, asleep AEDs during EEG study: None Technical aspects: This EEG study was done with scalp electrodes positioned according to the 10-20 International system of electrode placement. Electrical activity was reviewed with band pass filter of 1-'70Hz'$ , sensitivity of 7 uV/mm, display speed of 55m/sec with a '60Hz'$  notched filter applied as appropriate. EEG data  were recorded continuously and digitally stored.  Video monitoring was available and reviewed as appropriate. Description: The posterior dominant rhythm consists of 7.5 Hz activity of moderate voltage (25-35 uV) seen predominantly in posterior head regions, symmetric and reactive to eye opening and eye closing. Sleep was characterized by vertex waves, sleep spindles (12 to 14 Hz), maximal frontocentral region. EEG showed intermittent generalized 3 to 6 Hz theta-delta slowing. Physiologic photic driving was not seen during photic stimulation. Hyperventilation was not performed.   ABNORMALITY - Intermittent slow, generalized IMPRESSION: This study is suggestive of mild diffuse encephalopathy, nonspecific etiology. No seizures or epileptiform discharges were seen throughout the recording. Lora Havens   CT ANGIO HEAD NECK W WO CM  Result Date: 08/07/2022 CLINICAL DATA:  Right-sided Horner syndrome EXAM: CT ANGIOGRAPHY HEAD AND NECK TECHNIQUE: Multidetector CT imaging of the head and neck was performed using the standard protocol during bolus administration of intravenous contrast. Multiplanar CT image reconstructions and MIPs were obtained to evaluate the vascular anatomy. Carotid stenosis measurements (when applicable) are obtained utilizing NASCET criteria, using the distal internal carotid diameter as the denominator. RADIATION DOSE REDUCTION: This exam was performed according to the departmental dose-optimization program  which includes automated exposure control, adjustment of the mA and/or kV according to patient size and/or use of iterative reconstruction technique. CONTRAST:  16m OMNIPAQUE IOHEXOL 350 MG/ML SOLN COMPARISON:  None Available. FINDINGS: CT HEAD FINDINGS Brain: There is no mass, hemorrhage or extra-axial collection. The size and configuration of the ventricles and extra-axial CSF spaces are normal. There is no acute or chronic infarction. The brain parenchyma is normal. Skull: The visualized skull base, calvarium and extracranial soft tissues are normal. Sinuses/Orbits: No fluid levels or advanced mucosal thickening of the visualized paranasal sinuses. No mastoid or middle ear effusion. The orbits are normal. CTA NECK FINDINGS SKELETON: There is no bony spinal canal stenosis. No lytic or blastic lesion. OTHER NECK: Partially necrotic right supraclavicular node measures 2.3 cm (7:119). UPPER CHEST: Bulky necrotic mediastinal lymph nodes measuring up to 2.5 cm. Numerous large bilateral pulmonary metastases. AORTIC ARCH: There is no calcific atherosclerosis of the aortic arch. There is no aneurysm, dissection or hemodynamically significant stenosis of the visualized portion of the aorta. Conventional 3 vessel aortic branching pattern. The visualized proximal subclavian arteries are widely patent. RIGHT CAROTID SYSTEM: Normal without aneurysm, dissection or stenosis. LEFT CAROTID SYSTEM: Normal without aneurysm, dissection or stenosis. VERTEBRAL ARTERIES: Left dominant configuration. Both origins are clearly patent. There is no dissection, occlusion or flow-limiting stenosis to the skull base (V1-V3 segments). CTA HEAD FINDINGS POSTERIOR CIRCULATION: --Vertebral arteries: Normal V4 segments. --Inferior cerebellar arteries: Normal. --Basilar artery: Normal. --Superior cerebellar arteries: Normal. --Posterior cerebral arteries (PCA): Normal. ANTERIOR CIRCULATION: --Intracranial internal carotid arteries: Severe narrowing  of both internal carotid arteries at the skull base. --Anterior cerebral arteries (ACA): Normal. Hypoplastic left A1 segment, normal variant --Middle cerebral arteries (MCA): Normal. VENOUS SINUSES: As permitted by contrast timing, patent. ANATOMIC VARIANTS: None Review of the MIP images confirms the above findings. IMPRESSION: 1. 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 No proximal large vessel occlusion. 2. No cervical arterial occlusion or stenosis. 3. Bulky necrotic mediastinal lymphadenopathy and numerous large bilateral pulmonary metastases. 4. Partially necrotic right supraclavicular lymph node, likely metastatic. Electronically Signed   By: KUlyses JarredM.D.   On: 08/07/2022 22:37   DG Chest Portable 1 View  Result Date: 08/07/2022 CLINICAL DATA:  Horner syndrome EXAM: PORTABLE CHEST 1 VIEW COMPARISON:  07/07/2022 CT and chest x-ray. FINDINGS: Innumerable bilateral pulmonary nodules again noted, similar to prior study. Heart is normal size. No effusions. No acute bony abnormality. IMPRESSION: Innumerable pulmonary nodules, unchanged. Electronically Signed   By: Rolm Baptise M.D.   On: 08/07/2022 22:02    Procedures Procedures   Ultrasound Guided Peripheral IV Indication: difficult to access - nursing staff unable to secure adequate peripheral IV access Location: R Antecubital Catheter Size: 20 G  Static views used to identify the target vein then usual prep with Chloraprep. IV placed on first attempt under dynamic US guidance. Dark red flash noted, and catheter advanced smoothly into the vein. NS saline flushed without resistance, witnessed swelling, or patient discomfort. IV secured with I-site and tegaderm. The patient tolerated the procedure well. Performed By: Margaretmary Eddy MD   Medications Ordered in ED Medications  potassium chloride 10 mEq in 100 mL IVPB (10 mEq Intravenous Not Given 0/2/54 2706)  folic acid (FOLVITE) tablet 1 mg (has no  administration in time range)  multivitamin with minerals tablet 1 tablet (has no administration in time range)  thiamine (VITAMIN B1) 500 mg in sodium chloride 0.9 % 50 mL IVPB (0 mg Intravenous Stopped 08/08/22 0219)  HYDROmorphone (DILAUDID) injection 1 mg (1 mg Intravenous Given 08/08/22 0908)  diazepam (VALIUM) injection 2.5 mg (has no administration in time range)  acetaminophen (TYLENOL) tablet 650 mg (has no administration in time range)    Or  acetaminophen (TYLENOL) suppository 650 mg (has no administration in time range)  ondansetron (ZOFRAN) tablet 4 mg (has no administration in time range)    Or  ondansetron (ZOFRAN) injection 4 mg (has no administration in time range)  oxyCODONE (Oxy IR/ROXICODONE) immediate release tablet 10 mg (10 mg Oral Given 08/07/22 2149)  potassium chloride SA (KLOR-CON M) CR tablet 40 mEq (40 mEq Oral Given 08/07/22 2314)  iohexol (OMNIPAQUE) 350 MG/ML injection 75 mL (75 mLs Intravenous Contrast Given 08/07/22 2223)  LORazepam (ATIVAN) tablet 0.5 mg (0.5 mg Oral Given 08/08/22 0014)  potassium chloride 10 mEq in 100 mL IVPB (0 mEq Intravenous Stopped 08/08/22 0353)    ED Course/ Medical Decision Making/ A&P Clinical Course as of 08/08/22 0956  Sat Aug 07, 2022  2220 Signed out to Dr Armandina Gemma.  [RP]  2250 Discussed with Dr Parke Simmers from neurology who recommends MRI brain and C-spine with and without contrast.  Feels that patient may need MRI of the lumbar and thoracic spine at a later time.  Also recommends checking thiamine level and giving the patient thiamine for possible Warnicke's encephalopathy.   [RP]  2345 Signed out to Dr Leonette Monarch.  [RP]    Clinical Course User Index [RP] Fransico Meadow, MD                             Medical Decision Making Amount and/or Complexity of Data Reviewed Labs: ordered. Radiology: ordered.  Risk Prescription drug management. Decision regarding hospitalization.   Natalie Hinton is a 37 y.o. female with comorbidities  that complicate the patient evaluation including metastatic cancer of unknown primary and marijuana use who presents to the emergency department with visual and auditory hallucinations and cranial nerve deficits on the right side  Initial Ddx:  Psychosis, paraneoplastic syndrome, brain mets, aneurysm/dissection, mediastinal mass, substance use, meningitis/encephalitis  MDM:  Initially concerned about brain mets or paraneoplastic syndrome given the patient's altered mental status.  Could also have vitamin deficiencies such as B12  or folate given her significant weight loss and cachectic state.  With her history of metastatic disease could potentially have a brain metastasis.  With her headache and right-sided facial pain will obtain CTA to evaluate for aneurysm or dissection.  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  However, this would not explain her lateral rectus palsy on the right eye or her visual hallucinations which suggest an organic etiology rather than primary psychiatric condition.  Also was using marijuana so could potentially be the cause but with her objective neurofindings feel that she needs to be worked up for other etiologies first.  No fevers or neck stiffness to suggest meningitis/encephalitis.  Plan:  Labs Ethanol Urine drug screen B12 Folate Urinalysis CTA head and neck  ED Summary/Re-evaluation:  There were significant delays in obtaining blood work due to IV access.  Patient's labs were sent and showed mild leukocytosis with hypokalemia and new hyponatremia.  Chest x-ray showed multiple pulmonary nodules that are consistent with prior.  Patient did have CTA which showed carotid artery stenosis bilaterally without other abnormalities to explain the patient's symptoms.  Discussed with neurology who recommended MRI of the brain and C-spine at this time with possible MRI of the lumbar and thoracic spine later.  They  also recommended she have a thiamine level sent and will be started on Warnicke's encephalopathy dose thiamine.  Patient signed out to oncoming EDP awaiting her MRI.  This patient presents to the ED for concern of complaints listed in HPI, this involves an extensive number of treatment options, and is a complaint that carries with it a high risk of complications and morbidity. Disposition including potential need for admission considered.   Dispo: Admit to Floor  Additional history obtained from significant other Records reviewed Outpatient Clinic Notes The following labs were independently interpreted: Chemistry and show no acute abnormality I independently reviewed the following imaging with scope of interpretation limited to determining acute life threatening conditions related to emergency care: CT Head and agree with the radiologist interpretation with the following exceptions: None I personally reviewed and interpreted cardiac monitoring: normal sinus rhythm  I personally reviewed and interpreted the pt's EKG: see above for interpretation  I have reviewed the patients home medications and made adjustments as needed Consults: Neurology  Final Clinical Impression(s) / ED Diagnoses Final diagnoses:  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

## 2022-08-08 ENCOUNTER — Emergency Department (HOSPITAL_COMMUNITY): Payer: Medicaid Other

## 2022-08-08 ENCOUNTER — Observation Stay (HOSPITAL_COMMUNITY): Payer: Medicaid Other

## 2022-08-08 DIAGNOSIS — G893 Neoplasm related pain (acute) (chronic): Secondary | ICD-10-CM | POA: Diagnosis present

## 2022-08-08 DIAGNOSIS — R683 Clubbing of fingers: Secondary | ICD-10-CM | POA: Diagnosis present

## 2022-08-08 DIAGNOSIS — R4182 Altered mental status, unspecified: Secondary | ICD-10-CM | POA: Diagnosis not present

## 2022-08-08 DIAGNOSIS — Z515 Encounter for palliative care: Secondary | ICD-10-CM

## 2022-08-08 DIAGNOSIS — R4701 Aphasia: Secondary | ICD-10-CM | POA: Diagnosis present

## 2022-08-08 DIAGNOSIS — Z681 Body mass index (BMI) 19 or less, adult: Secondary | ICD-10-CM | POA: Diagnosis not present

## 2022-08-08 DIAGNOSIS — R64 Cachexia: Secondary | ICD-10-CM | POA: Diagnosis present

## 2022-08-08 DIAGNOSIS — C7931 Secondary malignant neoplasm of brain: Secondary | ICD-10-CM | POA: Diagnosis present

## 2022-08-08 DIAGNOSIS — F17210 Nicotine dependence, cigarettes, uncomplicated: Secondary | ICD-10-CM | POA: Diagnosis present

## 2022-08-08 DIAGNOSIS — E43 Unspecified severe protein-calorie malnutrition: Secondary | ICD-10-CM | POA: Diagnosis not present

## 2022-08-08 DIAGNOSIS — R509 Fever, unspecified: Secondary | ICD-10-CM | POA: Diagnosis not present

## 2022-08-08 DIAGNOSIS — G9340 Encephalopathy, unspecified: Secondary | ICD-10-CM | POA: Diagnosis present

## 2022-08-08 DIAGNOSIS — C7802 Secondary malignant neoplasm of left lung: Secondary | ICD-10-CM | POA: Diagnosis present

## 2022-08-08 DIAGNOSIS — C801 Malignant (primary) neoplasm, unspecified: Secondary | ICD-10-CM

## 2022-08-08 DIAGNOSIS — C7949 Secondary malignant neoplasm of other parts of nervous system: Secondary | ICD-10-CM | POA: Diagnosis not present

## 2022-08-08 DIAGNOSIS — C771 Secondary and unspecified malignant neoplasm of intrathoracic lymph nodes: Secondary | ICD-10-CM | POA: Diagnosis present

## 2022-08-08 DIAGNOSIS — C7989 Secondary malignant neoplasm of other specified sites: Secondary | ICD-10-CM | POA: Diagnosis present

## 2022-08-08 DIAGNOSIS — C7951 Secondary malignant neoplasm of bone: Secondary | ICD-10-CM | POA: Diagnosis present

## 2022-08-08 DIAGNOSIS — D72828 Other elevated white blood cell count: Secondary | ICD-10-CM | POA: Diagnosis present

## 2022-08-08 DIAGNOSIS — R410 Disorientation, unspecified: Secondary | ICD-10-CM | POA: Diagnosis not present

## 2022-08-08 DIAGNOSIS — G928 Other toxic encephalopathy: Secondary | ICD-10-CM | POA: Diagnosis present

## 2022-08-08 DIAGNOSIS — E871 Hypo-osmolality and hyponatremia: Secondary | ICD-10-CM | POA: Diagnosis present

## 2022-08-08 DIAGNOSIS — I6523 Occlusion and stenosis of bilateral carotid arteries: Secondary | ICD-10-CM | POA: Diagnosis present

## 2022-08-08 DIAGNOSIS — C7801 Secondary malignant neoplasm of right lung: Secondary | ICD-10-CM | POA: Diagnosis present

## 2022-08-08 DIAGNOSIS — E876 Hypokalemia: Secondary | ICD-10-CM | POA: Diagnosis present

## 2022-08-08 DIAGNOSIS — Z7189 Other specified counseling: Secondary | ICD-10-CM | POA: Diagnosis not present

## 2022-08-08 DIAGNOSIS — Z801 Family history of malignant neoplasm of trachea, bronchus and lung: Secondary | ICD-10-CM | POA: Diagnosis not present

## 2022-08-08 DIAGNOSIS — R29810 Facial weakness: Secondary | ICD-10-CM | POA: Diagnosis present

## 2022-08-08 DIAGNOSIS — Z66 Do not resuscitate: Secondary | ICD-10-CM | POA: Diagnosis present

## 2022-08-08 DIAGNOSIS — C799 Secondary malignant neoplasm of unspecified site: Secondary | ICD-10-CM | POA: Diagnosis present

## 2022-08-08 DIAGNOSIS — K5900 Constipation, unspecified: Secondary | ICD-10-CM | POA: Diagnosis present

## 2022-08-08 DIAGNOSIS — R54 Age-related physical debility: Secondary | ICD-10-CM | POA: Diagnosis present

## 2022-08-08 LAB — BASIC METABOLIC PANEL
Anion gap: 13 (ref 5–15)
BUN: <5 mg/dL — ABNORMAL LOW (ref 6–20)
CO2: 25 mmol/L (ref 22–32)
Calcium: 9.3 mg/dL (ref 8.9–10.3)
Chloride: 95 mmol/L — ABNORMAL LOW (ref 98–111)
Creatinine, Ser: 0.48 mg/dL (ref 0.44–1.00)
GFR, Estimated: >60 mL/min (ref >60)
Glucose, Bld: 99 mg/dL (ref 70–99)
Potassium: 3.9 mmol/L (ref 3.5–5.1)
Sodium: 133 mmol/L — ABNORMAL LOW (ref 135–145)

## 2022-08-08 LAB — CBC
HCT: 33 % — ABNORMAL LOW (ref 36.0–46.0)
Hemoglobin: 11.2 g/dL — ABNORMAL LOW (ref 12.0–15.0)
MCH: 28.6 pg (ref 26.0–34.0)
MCHC: 33.9 g/dL (ref 30.0–36.0)
MCV: 84.2 fL (ref 80.0–100.0)
Platelets: 518 10*3/uL — ABNORMAL HIGH (ref 150–400)
RBC: 3.92 MIL/uL (ref 3.87–5.11)
RDW: 14.7 % (ref 11.5–15.5)
WBC: 13 10*3/uL — ABNORMAL HIGH (ref 4.0–10.5)
nRBC: 0 % (ref 0.0–0.2)

## 2022-08-08 LAB — HIV ANTIBODY (ROUTINE TESTING W REFLEX): HIV Screen 4th Generation wRfx: NONREACTIVE

## 2022-08-08 LAB — PHOSPHORUS: Phosphorus: 3.6 mg/dL (ref 2.5–4.6)

## 2022-08-08 LAB — MAGNESIUM
Magnesium: 1.9 mg/dL (ref 1.7–2.4)
Magnesium: 2 mg/dL (ref 1.7–2.4)

## 2022-08-08 MED ORDER — HYDROMORPHONE HCL 1 MG/ML IJ SOLN
1.0000 mg | INTRAMUSCULAR | Status: DC | PRN
  Administered 2022-08-08 (×3): 1 mg via INTRAVENOUS
  Filled 2022-08-08 (×3): qty 1

## 2022-08-08 MED ORDER — LACTATED RINGERS IV SOLN: INTRAVENOUS | Status: DC

## 2022-08-08 MED ORDER — ONDANSETRON HCL 4 MG PO TABS: 4.0000 mg | ORAL_TABLET | Freq: Four times a day (QID) | ORAL | Status: DC | PRN

## 2022-08-08 MED ORDER — ACETAMINOPHEN 325 MG PO TABS
650.0000 mg | ORAL_TABLET | Freq: Four times a day (QID) | ORAL | Status: DC | PRN
  Administered 2022-08-08 – 2022-08-12 (×4): 650 mg via ORAL
  Filled 2022-08-08 (×4): qty 2

## 2022-08-08 MED ORDER — THIAMINE HCL 100 MG/ML IJ SOLN
500.0000 mg | Freq: Three times a day (TID) | INTRAVENOUS | Status: DC
  Administered 2022-08-08 – 2022-08-11 (×10): 500 mg via INTRAVENOUS
  Filled 2022-08-08 (×12): qty 5

## 2022-08-08 MED ORDER — POTASSIUM CHLORIDE 10 MEQ/100ML IV SOLN
10.0000 meq | INTRAVENOUS | Status: AC
  Administered 2022-08-08 (×2): 10 meq via INTRAVENOUS
  Filled 2022-08-08 (×2): qty 100

## 2022-08-08 MED ORDER — DIAZEPAM 5 MG/ML IJ SOLN: 2.5000 mg | Freq: Once | INTRAMUSCULAR | Status: DC

## 2022-08-08 MED ORDER — DIAZEPAM 5 MG/ML IJ SOLN
2.5000 mg | INTRAMUSCULAR | Status: DC | PRN
  Administered 2022-08-08: 2.5 mg via INTRAVENOUS
  Filled 2022-08-08: qty 2

## 2022-08-08 MED ORDER — HYDROMORPHONE HCL 1 MG/ML IJ SOLN
1.0000 mg | INTRAMUSCULAR | Status: DC | PRN
  Administered 2022-08-08 – 2022-08-16 (×49): 1 mg via INTRAVENOUS
  Filled 2022-08-08 (×49): qty 1

## 2022-08-08 MED ORDER — ONDANSETRON HCL 4 MG/2ML IJ SOLN
4.0000 mg | Freq: Four times a day (QID) | INTRAMUSCULAR | Status: DC | PRN
  Administered 2022-08-11: 4 mg via INTRAVENOUS
  Filled 2022-08-08 (×2): qty 2

## 2022-08-08 MED ORDER — ACETAMINOPHEN 650 MG RE SUPP: 650.0000 mg | Freq: Four times a day (QID) | RECTAL | Status: DC | PRN

## 2022-08-08 NOTE — Assessment & Plan Note (Addendum)
Encephalopathy and hallucinations that are persistent despite stopping pain meds as outpt, subacute headaches, N/V, multiple cranial nerve findings on neuro exam, all in a patient with known widespread metastatic cancer of unknown primary. 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 Needs MRI brain w and w/o contrast-third attempt today as she was unable to stay still due to pain during prior attempts IV dilaudid PRN pain LP as next step if MRI non-diagnostic. See neurology consult note for further details Being transferred to [facility] to start radiation today.  Oncology is aware and will see the patient

## 2022-08-08 NOTE — Progress Notes (Signed)
Brief Neuro Update:  No charge note.  Please see Dr. Lyn Records note from earlier today for full recs.  Was unable to tolerate MRI due to pain. Seems like pain meds were held at home for 2 days prior to admission as it was felt to be contributing to her confusion.  Recommend attempting MRI again after controlling pain and with a small dose of valium.  In addition to MRI Brain, I ordered MRI total spine mets screening protocol which would help evaluate for Leptomeningeal mets as well as solid tumors in the CNS. Addiing spinal images increases the sensitivity of detecting Leptomeningeal disease.  If the MRI is unrevealing, next steps will be LP to evaluate for LMD. Would prefer to exhaust non invasive testing before considering LP.  [place] Pager Number [phone]

## 2022-08-08 NOTE — ED Notes (Signed)
Patient unable to complete MRI. Patient had complaints of pain, and noise stimulation and refused to continue. Patient has been returned to room. RN notified EDP.

## 2022-08-08 NOTE — Progress Notes (Signed)
   08/08/22 1007  Assess: MEWS Score  Temp 99.6 F (37.6 C)  BP 126/85  MAP (mmHg) 98  Pulse Rate (!) 115  Resp 17  Level of Consciousness Alert  SpO2 94 %  O2 Device Room Air  Assess: MEWS Score  MEWS Temp 0  MEWS Systolic 0  MEWS Pulse 2  MEWS RR 0  MEWS LOC 0  MEWS Score 2  MEWS Score Color Yellow  Assess: if the MEWS score is Yellow or Red  Were vital signs taken at a resting state? Yes  Focused Assessment No change from prior assessment  Does the patient meet 2 or more of the SIRS criteria? Yes  Treat  Pain Scale 0-10  Pain Score Asleep  Patients Stated Pain Goal 0  Neuro symptoms relieved by Rest;Relaxation techniques (Comment)  Take Vital Signs  Increase Vital Sign Frequency  Yellow: Q 2hr X 2 then Q 4hr X 2, if remains yellow, continue Q 4hrs  Document  Patient Outcome Stabilized after interventions  Assess: SIRS CRITERIA  SIRS Temperature  0  SIRS Pulse 1  SIRS Respirations  0  SIRS WBC 1  SIRS Score Sum  2

## 2022-08-08 NOTE — ED Notes (Signed)
Patient transported to MRI 

## 2022-08-08 NOTE — ED Notes (Signed)
RN held off on giving IV potassium due to MRI. Also Neuro MD did not want thiamine given until lab was drawn for the level. Multiple people have attempted to get blood drawn and unsuccessful.  RN also held off on giving thiamine because it is a IV drip and can not go to MRI with her.  Will start the medications when she returns from MRI. EDP aware.

## 2022-08-08 NOTE — H&P (Signed)
History and Physical    Patient: Natalie Hinton FTD:[id number] DOB: 08-22-1985 DOA: 08/07/2022 DOS: the patient was seen and examined on 08/08/2022 PCP: [name], MD  Patient coming from: Home  Chief Complaint:  Chief Complaint  Patient presents with   Altered Mental Status   HPI: Natalie Hinton is a 37 y.o. female with medical history significant of metastatic CA of unknown primary.  Pt in to ED with confusion and abnormal eye movements.  Pt started pain meds ~10 days ago.  2 days ago very confused, fiance thought this was due to pain meds and so stopped giving these to her.  Unfortunately confusion has persisted.  Pt having headaches which wake her up at night, and improve while more upright.  These ongoing for past 3 weeks.  Poor appetite with some N/V.  Abnormal eye movements for past 2 days as well.    Review of Systems: As mentioned in the history of present illness. All other systems reviewed and are negative. Past Medical History:  Diagnosis Date   Allergy    Anxiety    Thyroid disease    History reviewed. No pertinent surgical history. Social History:  reports that she has been smoking cigarettes. She started smoking about 20 years ago. She has a 10.00 pack-year smoking history. She has never used smokeless tobacco. She reports current drug use. Drug: Marijuana. She reports that she does not drink alcohol.  Allergies  Allergen Reactions   Penicillins Hives   Gabapentin Other (See Comments)    hallucinations    Family History  Problem Relation Age of Onset   Lung cancer Maternal Grandfather 51 - 69   Breast cancer Neg Hx    Ovarian cancer Neg Hx    Endometrial cancer Neg Hx    Prostate cancer Neg Hx    Pancreatic cancer Neg Hx    Colon cancer Neg Hx     Prior to Admission medications   Medication Sig Start Date End Date Taking? Authorizing Provider  ondansetron (ZOFRAN-ODT) 8 MG disintegrating tablet Take 1 tablet (8 mg total) by mouth every 8  (eight) hours as needed for nausea or vomiting. 08/02/22  Yes Vladimir Crofts, PA-C  omeprazole (PRILOSEC) 40 MG capsule Take 1 capsule (40 mg total) by mouth 2 (two) times daily before a meal. Patient not taking: Reported on 08/07/2022 08/06/22   Sharyn Creamer, MD  oxyCODONE 10 MG TABS Take 0.5 tablets (5 mg total) by mouth every 4 (four) hours as needed for severe pain. Patient not taking: Reported on 08/06/2022 07/20/22   Lincoln Brigham, PA-C  promethazine (PHENERGAN) 12.5 MG suppository Place 1 suppository (12.5 mg total) rectally every 8 (eight) hours as needed for nausea or vomiting. Patient not taking: Reported on 08/07/2022 08/02/22   Vladimir Crofts, [place]    Physical Exam: Vitals:   08/08/22 0019 08/08/22 0300 08/08/22 0345 08/08/22 0402  BP: 137/84 (!) 134/95  131/89  Pulse: (!) 104 (!) 106  (!) 105  Resp: '16 20  20  '$ Temp:   98 F (36.7 C)   TempSrc:      SpO2: 99% 99%  98%  Weight:      Height:       Constitutional: Cachectic Eyes: Restricted movement of R eye with ptosis of R eye, L eye with nystagmus on R gaze Respiratory: clear to auscultation bilaterally, no wheezing, no crackles. Normal respiratory effort. No accessory muscle use.  Cardiovascular: Regular rate and rhythm, no murmurs /  rubs / gallops. No extremity edema. 2+ pedal pulses. No carotid bruits.  Abdomen: no tenderness, no masses palpated. No hepatosplenomegaly. Bowel sounds positive.  Musculoskeletal: Cachectic with muscle wasting Neurologic: MAE, eye movement abnormalities as above.  Also has L lower facial droop sparing forehead. Psychiatric: Sleepy, confused  Data Reviewed:     CTA head and neck = IMPRESSION: 1. 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 No proximal large vessel occlusion. 2. No cervical arterial occlusion or stenosis. 3. Bulky necrotic mediastinal lymphadenopathy and numerous large bilateral pulmonary metastases. 4. Partially necrotic right supraclavicular  lymph node, likely metastatic.     Latest Ref Rng & Units 08/07/2022    9:23 PM 07/14/2022    2:20 PM 07/07/2022    3:29 PM  CMP  Glucose 70 - 99 mg/dL 79  91  141   BUN 6 - 20 mg/dL '7  8  7   '$ Creatinine 0.44 - 1.00 mg/dL 0.52  0.49  0.54   Sodium 135 - 145 mmol/L 130  137  135   Potassium 3.5 - 5.1 mmol/L 2.9  4.3  4.6   Chloride 98 - 111 mmol/L 92  100  100   CO2 22 - 32 mmol/L '25  27  22   '$ Calcium 8.9 - 10.3 mg/dL 9.4  9.1  9.7   Total Protein 6.5 - 8.1 g/dL 7.0  8.0  8.3   Total Bilirubin 0.3 - 1.2 mg/dL 1.2  <0.1  0.4   Alkaline Phos 38 - 126 U/L 65  87  83   AST 15 - 41 U/L 37  25  25   ALT 0 - 44 U/L '23  17  12       '$ Latest Ref Rng & Units 08/07/2022    9:23 PM 07/14/2022    2:20 PM 07/07/2022    3:29 PM  CBC  WBC 4.0 - 10.5 K/uL 13.9  16.5  15.7   Hemoglobin 12.0 - 15.0 g/dL 11.5  14.8  13.3   Hematocrit 36.0 - 46.0 % 34.3  45.1  40.8   Platelets 150 - 400 K/uL 499  405  493      Assessment and Plan: * Acute encephalopathy Encephalopathy and hallucinations that are persistent despite stopping pain meds as outpt, subacute headaches, N/V, multiple cranial nerve findings on neuro exam, all in a patient with known widespread metastatic cancer of unknown primary. 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 Needs MRI brain w and w/o contrast Wasn't able to tolerate in ED due to pain. IV dilaudid PRN pain Re-attempt MRI brain now that pain better controlled. LP as next step if MRI non-diagnostic. See neurology consult note for further details Consult oncology in AM as well.      Advance Care Planning:   Code Status: Full Code   Consults: Neurology  Family Communication: Family at bedside  Severity of Illness: The appropriate patient status for this patient is OBSERVATION. Observation status is judged to be reasonable and necessary in order to provide the required intensity of service to ensure the patient's safety. The patient's presenting symptoms,  physical exam findings, and initial radiographic and laboratory data in the context of their medical condition is felt to place them at decreased risk for further clinical deterioration. Furthermore, it is anticipated that the patient will be medically stable for discharge from the hospital within 2 midnights of admission.   Author: Etta Quill., DO 08/08/2022 4:41 AM  For on call review  [email or web address].  

## 2022-08-08 NOTE — Progress Notes (Signed)
No charge progress note.  Natalie Hinton is a 37 y.o. female with medical history significant of metastatic CA of unknown primary was brought to ED with confusion and abnormal eye movements for the past 2 days. Patient was also recently started on pain medications about 10 days ago. Patient was also experiencing headaches which wakes her up at night, improves while more upright for the past 3 weeks.  Poor p.o. intake with some nausea and vomiting.  ED course.  Patient was found to be cachectic with muscle wasting, abnormal eye movements and left lower facial droop.  Labs pertinent for mild hyponatremia with sodium at 130, potassium 2.9, CBC with leukocytosis at 13.9 which seems improved than most recent check of 16.5, approximately 3 weeks ago. CTA head and neck = IMPRESSION: 1. 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 No proximal large vessel occlusion. 2. No cervical arterial occlusion or stenosis. 3. Bulky necrotic mediastinal lymphadenopathy and numerous large bilateral pulmonary metastases. 4. Partially necrotic right supraclavicular lymph node, likely metastatic. MRI ordered with concern of brain mets-pending. Neurology and palliative care was also consulted.  High risk for brain mets versus leptomeningeal disease.  Patient was seen and examined today.  She was complaining of abdominal pain.  No nausea or vomiting.  Denies any headache or change in vision.  EEG with mild diffuse encephalopathy.  No seizure or epileptiform discharges are noted.  Still awaiting MRI.  On exam she is a cachectic lady.  Very poor dentition.  Rest of the exam was benign.  -Continue with pain management -Told nurse to give Dilaudid and Valium so she can have MRI.

## 2022-08-08 NOTE — Progress Notes (Signed)
Radiation Oncology         [phone] ________________________________  Initial Outpatient Consultation  Name: Natalie Hinton MRN: [id number]  Date: 08/09/2022  DOB: Apr 15, 1986  RS:WNIOE, Catalina Lunger, MD  Orson Slick, MD   REFERRING PHYSICIAN: Orson Slick, MD  DIAGNOSIS: There were no encounter diagnoses.  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 female who is accompanied by ***. she is seen as a courtesy of Dr. Lorenso Courier for an opinion concerning radiation therapy as part of management for her recently diagnosed multifocal metastatic disease from an unknown primary .   The patient initially presented to an urgent care facility on 07/07/22 with the cc of cough x 1 month. Chest x-ray performed showed innumerable rounded solid bilateral, lower lobe predominant pulmonary nodules, highly suspicious for metastatic disease.   The patient was accordingly transferred to the ED that same date for further evaluation. In the ED, the patient endorsed  productive cough, right sided neck swelling, and left sided pelvic pain for several weeks. She also endorsed a 25-40 pound weight loss over the past several months. Chest CT performed in the ED showed multiple bilateral soft tissue nodules, consisting of approximately 30 right sided nodules, and approximately 25 nodules on the left measuring up to 2 cm. CT also showed multiple areas of abnormal lymph nodes in the right thoracic inlet and mediastinum. Soft tissue neck CT and CT of the head with and without contrast demonstrated a centrally necrotic right level 4 lymph node consistent with metastatic disease. CT otherwise showed no evidence of metastatic disease in the brain or skull base, and no additional acute processes in the neck. CT CAP also performed  demonstrated: findings typical of metastatic disease in the thorax with innumerable pulmonary nodules; necrotic mediastinal and right thoracic inlet adenopathy; a cystic mass in the pelvic midline concerning for neoplasm; a heterogeneous lesion posterior to the uterus; and a destructive bone lesion in the left superior pubic ramus typical of metastatic disease. CT otherwise showed no other definite sites of osseous metastatic disease.    Dr. Lorenso Courier was consulted in the ED who arranged for OP follow-up for a tissue biopsy.   Accordingly, the patient met with PA Thayil (medical oncology) on 07/14/22 for further management. During this visit, the patient reported: a 30-40 pound unintentional weight loss over the last 3 months, persistent severe pelvic pain for the last month without relief using OTC analgesics, pain with urination and bowel movement, yellow/white vaginal discharge, left hip pain that is persistent and worse with ambulation, persistent shortness of breath with exertion and at rest, and night sweats.     The patient opted to proceed with a needle core biopsy of the right supraclavicular mass/lymph node on 07/21/21. Pathology revealed poorly differentiated carcinoma. FNA of the pelvic cystic mass also performed on this date showed atypical cells present.   Her pain management regimen while undergoing work up included IM dilaudid.     Supraclavicular and mediastinal lymphadenopathy prompted a diagnostic bilateral breast mammogram and right breast ultrasound on 07/29/22 which showed no mammographic evidence of malignancy in either breast.    The patient was then referred to Dr. Ernestina Patches on 07/26/22 for further evaluation. Exam findings during this visit showed evidence to suggest that he pelvic pain originates at the pubic ramus, correlating with the  site of the destructive bone lesion with possible extension into adjacent soft tissue. Given that the bone lesion is the main source of her pain, Dr.  Ernestina Patches recommends chemotherapy +/- radiation to both help control disease as well as aid in symptom management. Dr. Ernestina Patches also recommends consideration of neuropathic pain medication in addition to her narcotic pain medication.   Dr. Ernestina Patches also sent for molecular studies, and based on IHC, her primaries may include either a breast primary or upper GI primary. Subsequently, the patient was recently referred to GI on 08/02/22 who have recommended an EGD to assess for a primary site.   In most recent history, the patient presented to the ED and was admitted on 08/07/22 for evaluation of confusion and abnormal eye movements. She also endorsed 3 weeks of headaches which would interfere with her sleep and improve with sleeping in a more up-right position, nausea, and vomiting. Neuro exam performed is positive for restricted right eye movement with ptosis of the right eye, nystagmus of the left eye, and a left lower facial droop.    Hospital work-up thus far has included a CTA of the head and neck performed on 02/03 which demonstrated: severe narrowing of both internal carotid arteries at the skull base (negative for proximal large vessel occlusion), bulky necrotic mediastinal lymphadenopathy, numerous large bilateral pulmonary metastases, and a likely metastatic partially necrotic right supraclavicular lymph node. CT otherwise showed no cervical arterial occlusion or stenosis.     MRI of the brain performed yesterday demonstrates: ***   PREVIOUS RADIATION THERAPY: No  PAST MEDICAL HISTORY:  Past Medical History:  Diagnosis Date   Allergy    Anxiety    Thyroid disease     PAST SURGICAL HISTORY:No past surgical history on file.  FAMILY HISTORY:  Family History  Problem Relation Age of Onset   Lung cancer Maternal Grandfather 75 - 69   Breast cancer Neg Hx    Ovarian cancer Neg Hx    Endometrial cancer Neg Hx    Prostate cancer Neg Hx    Pancreatic cancer Neg Hx    Colon cancer Neg Hx      SOCIAL HISTORY:  Social History   Tobacco Use   Smoking status: Every Day    Packs/day: 0.50    Years: 20.00    Total pack years: 10.00    Types: Cigarettes    Start date: 2004   Smokeless tobacco: Never  Vaping Use   Vaping Use: Never used  Substance Use Topics   Alcohol use: Never   Drug use: Yes    Types: Marijuana    ALLERGIES:  Allergies  Allergen Reactions   Penicillins Hives   Gabapentin Other (See Comments)    hallucinations    MEDICATIONS:  No current facility-administered medications for this encounter.   No current outpatient medications on file.   Facility-Administered Medications Ordered in Other Encounters  Medication Dose Route Frequency Provider Last Rate Last Admin   acetaminophen (TYLENOL) tablet 650 mg  650 mg Oral Q6H PRN Etta Quill, DO       Or   acetaminophen (TYLENOL) suppository 650 mg  650 mg Rectal Q6H PRN Etta Quill, DO       diazepam (VALIUM) injection 2.5 mg  2.5 mg Intravenous PRN Cardama, Grayce Sessions, MD   2.5 mg at 32/95/18 8416   folic acid (FOLVITE) tablet 1 mg  1 mg Oral Daily Fransico Meadow, MD   1 mg at 08/08/22 1002  HYDROmorphone (DILAUDID) injection 1 mg  1 mg Intravenous Q4H PRN Cardama, Grayce Sessions, MD   1 mg at 08/08/22 0908   multivitamin with minerals tablet 1 tablet  1 tablet Oral Daily Fransico Meadow, MD   1 tablet at 08/08/22 1002   ondansetron (ZOFRAN) tablet 4 mg  4 mg Oral Q6H PRN Etta Quill, DO       Or   ondansetron [facility]) injection 4 mg  4 mg Intravenous Q6H PRN Etta Quill, DO       thiamine (VITAMIN B1) 500 mg in sodium chloride 0.9 % 50 mL IVPB  500 mg Intravenous TID Pham, Minh Q, RPH-CPP   Stopped at 08/08/22 0219    REVIEW OF SYSTEMS:  A 10+ POINT REVIEW OF SYSTEMS WAS OBTAINED including neurology, dermatology, psychiatry, cardiac, respiratory, lymph, extremities, GI, GU, musculoskeletal, constitutional, reproductive, HEENT. ***   PHYSICAL EXAM:  vitals were not  taken for this visit.   General: Alert and oriented, in no acute distress HEENT: Head is normocephalic. Extraocular movements are intact. Oropharynx is clear. Neck: Neck is supple, no palpable cervical or supraclavicular lymphadenopathy. Heart: Regular in rate and rhythm with no murmurs, rubs, or gallops. Chest: Clear to auscultation bilaterally, with no rhonchi, wheezes, or rales. Abdomen: Soft, nontender, nondistended, with no rigidity or guarding. Extremities: No cyanosis or edema. Lymphatics: see Neck Exam Skin: No concerning lesions. Musculoskeletal: symmetric strength and muscle tone throughout. Neurologic: Cranial nerves II through XII are grossly intact. No obvious focalities. Speech is fluent. Coordination is intact. Psychiatric: Judgment and insight are intact. Affect is appropriate. ***  ECOG = ***  0 - Asymptomatic (Fully active, able to carry on all predisease activities without restriction)  1 - Symptomatic but completely ambulatory (Restricted in physically strenuous activity but ambulatory and able to carry out work of a light or sedentary nature. For example, light housework, office work)  2 - Symptomatic, <50% in bed during the day (Ambulatory and capable of all self care but unable to carry out any work activities. Up and about more than 50% of waking hours)  3 - Symptomatic, >50% in bed, but not bedbound (Capable of only limited self-care, confined to bed or chair 50% or more of waking hours)  4 - Bedbound (Completely disabled. Cannot carry on any self-care. Totally confined to bed or chair)  5 - Death   Eustace Pen MM, Creech RH, Tormey DC, et al. [phone]). "Toxicity and response criteria of the [employer]". [place] Oncol. 5 (6): 649-55  LABORATORY DATA:  Lab Results  Component Value Date   WBC 13.0 (H) 08/08/2022   HGB 11.2 (L) 08/08/2022   HCT 33.0 (L) 08/08/2022   MCV 84.2 08/08/2022   PLT 518 (H) 08/08/2022   NEUTROABS 12.8 (H)  07/14/2022   Lab Results  Component Value Date   NA 133 (L) 08/08/2022   K 3.9 08/08/2022   CL 95 (L) 08/08/2022   CO2 25 08/08/2022   GLUCOSE 99 08/08/2022   BUN <5 (L) 08/08/2022   CREATININE 0.48 08/08/2022   CALCIUM 9.3 08/08/2022      RADIOGRAPHY: EEG adult  Result Date: 08/08/2022 Lora Havens, MD     08/08/2022  8:23 AM Patient Name: Ajiah M Hodder MRN: [id number] Epilepsy Attending: Lora Havens Referring Physician/Provider: Lorenza Chick, MD Date: 08/08/2022 Duration: 28.23 mins Patient history: 37yo F with ams. EEG to evaluate for seizure Level of alertness: Awake, asleep AEDs during EEG study: None  Technical aspects: This EEG study was done with scalp electrodes positioned according to the 10-20 International system of electrode placement. Electrical activity was reviewed with band pass filter of 1-'70Hz'$ , sensitivity of 7 uV/mm, display speed of 97m/sec with a '60Hz'$  notched filter applied as appropriate. EEG data were recorded continuously and digitally stored.  Video monitoring was available and reviewed as appropriate. Description: The posterior dominant rhythm consists of 7.5 Hz activity of moderate voltage (25-35 uV) seen predominantly in posterior head regions, symmetric and reactive to eye opening and eye closing. Sleep was characterized by vertex waves, sleep spindles (12 to 14 Hz), maximal frontocentral region. EEG showed intermittent generalized 3 to 6 Hz theta-delta slowing. Physiologic photic driving was not seen during photic stimulation. Hyperventilation was not performed.   ABNORMALITY - Intermittent slow, generalized IMPRESSION: This study is suggestive of mild diffuse encephalopathy, nonspecific etiology. No seizures or epileptiform discharges were seen throughout the recording. PLora Havens  CT ANGIO HEAD NECK W WO CM  Result Date: 08/07/2022 CLINICAL DATA:  Right-sided Horner syndrome EXAM: CT ANGIOGRAPHY HEAD AND NECK TECHNIQUE: Multidetector CT imaging  of the head and neck was performed using the standard protocol during bolus administration of intravenous contrast. Multiplanar CT image reconstructions and MIPs were obtained to evaluate the vascular anatomy. Carotid stenosis measurements (when applicable) are obtained utilizing NASCET criteria, using the distal internal carotid diameter as the denominator. RADIATION DOSE REDUCTION: This exam was performed according to the departmental dose-optimization program which includes automated exposure control, adjustment of the mA and/or kV according to patient size and/or use of iterative reconstruction technique. CONTRAST:  769mOMNIPAQUE IOHEXOL 350 MG/ML SOLN COMPARISON:  None Available. FINDINGS: CT HEAD FINDINGS Brain: There is no mass, hemorrhage or extra-axial collection. The size and configuration of the ventricles and extra-axial CSF spaces are normal. There is no acute or chronic infarction. The brain parenchyma is normal. Skull: The visualized skull base, calvarium and extracranial soft tissues are normal. Sinuses/Orbits: No fluid levels or advanced mucosal thickening of the visualized paranasal sinuses. No mastoid or middle ear effusion. The orbits are normal. CTA NECK FINDINGS SKELETON: There is no bony spinal canal stenosis. No lytic or blastic lesion. OTHER NECK: Partially necrotic right supraclavicular node measures 2.3 cm (7:119). UPPER CHEST: Bulky necrotic mediastinal lymph nodes measuring up to 2.5 cm. Numerous large bilateral pulmonary metastases. AORTIC ARCH: There is no calcific atherosclerosis of the aortic arch. There is no aneurysm, dissection or hemodynamically significant stenosis of the visualized portion of the aorta. Conventional 3 vessel aortic branching pattern. The visualized proximal subclavian arteries are widely patent. RIGHT CAROTID SYSTEM: Normal without aneurysm, dissection or stenosis. LEFT CAROTID SYSTEM: Normal without aneurysm, dissection or stenosis. VERTEBRAL ARTERIES: Left  dominant configuration. Both origins are clearly patent. There is no dissection, occlusion or flow-limiting stenosis to the skull base (V1-V3 segments). CTA HEAD FINDINGS POSTERIOR CIRCULATION: --Vertebral arteries: Normal V4 segments. --Inferior cerebellar arteries: Normal. --Basilar artery: Normal. --Superior cerebellar arteries: Normal. --Posterior cerebral arteries (PCA): Normal. ANTERIOR CIRCULATION: --Intracranial internal carotid arteries: Severe narrowing of both internal carotid arteries at the skull base. --Anterior cerebral arteries (ACA): Normal. Hypoplastic left A1 segment, normal variant --Middle cerebral arteries (MCA): Normal. VENOUS SINUSES: As permitted by contrast timing, patent. ANATOMIC VARIANTS: None Review of the MIP images confirms the above findings. IMPRESSION: 1. 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 No proximal large vessel occlusion. 2. No cervical arterial occlusion or stenosis. 3. Bulky necrotic mediastinal lymphadenopathy and numerous large  bilateral pulmonary metastases. 4. Partially necrotic right supraclavicular lymph node, likely metastatic. Electronically Signed   By: Ulyses Jarred M.D.   On: 08/07/2022 22:37   DG Chest Portable 1 View  Result Date: 08/07/2022 CLINICAL DATA:  Horner syndrome EXAM: PORTABLE CHEST 1 VIEW COMPARISON:  07/07/2022 CT and chest x-ray. FINDINGS: Innumerable bilateral pulmonary nodules again noted, similar to prior study. Heart is normal size. No effusions. No acute bony abnormality. IMPRESSION: Innumerable pulmonary nodules, unchanged. Electronically Signed   By: Rolm Baptise M.D.   On: 08/07/2022 22:02   MM DIAG BREAST TOMO BILATERAL  Result Date: 07/29/2022 CLINICAL DATA:  37 year old female with recent CT scan demonstrating widely metastatic disease with innumerable pulmonary nodules, supraclavicular and mediastinal lymphadenopathy, pelvic/presacral mass and osseous metastases. Core biopsy of a right  supraclavicular node revealed a poorly differentiated carcinoma. EXAM: DIGITAL DIAGNOSTIC BILATERAL MAMMOGRAM WITH TOMOSYNTHESIS; ULTRASOUND RIGHT BREAST LIMITED TECHNIQUE: Bilateral digital diagnostic mammography and breast tomosynthesis was performed.; Targeted ultrasound examination of the right breast was performed COMPARISON:  None. ACR Breast Density Category d: The breast tissue is extremely dense, which lowers the sensitivity of mammography. FINDINGS: No suspicious masses or calcifications are seen in either breast. An initially questioned asymmetry seen in the right breast is felt to resolve on the additional imaging, however given extreme breast density ultrasound will be performed. Targeted ultrasound of the right breast was performed. No suspicious masses or any other worrisome abnormalities identified, only extremely dense fibroglandular tissue seen. IMPRESSION: No mammographic evidence of malignancy in either breast. RECOMMENDATION: 1. If there is concern for a primary breast malignancy, then further evaluation with breast MRI with contrast would be recommended. 2.  Screening mammogram in one year.(Code:SM-B-01Y) I have discussed the findings and recommendations with the patient. If applicable, a reminder letter will be sent to the patient regarding the next appointment. BI-RADS CATEGORY  1: Negative. Electronically Signed   By: Everlean Alstrom M.D.   On: 07/29/2022 16:58   US BREAST LTD UNI RIGHT INC AXILLA  Result Date: 07/29/2022 CLINICAL DATA:  36 year old female with recent CT scan demonstrating widely metastatic disease with innumerable pulmonary nodules, supraclavicular and mediastinal lymphadenopathy, pelvic/presacral mass and osseous metastases. Core biopsy of a right supraclavicular node revealed a poorly differentiated carcinoma. EXAM: DIGITAL DIAGNOSTIC BILATERAL MAMMOGRAM WITH TOMOSYNTHESIS; ULTRASOUND RIGHT BREAST LIMITED TECHNIQUE: Bilateral digital diagnostic mammography and breast  tomosynthesis was performed.; Targeted ultrasound examination of the right breast was performed COMPARISON:  None. ACR Breast Density Category d: The breast tissue is extremely dense, which lowers the sensitivity of mammography. FINDINGS: No suspicious masses or calcifications are seen in either breast. An initially questioned asymmetry seen in the right breast is felt to resolve on the additional imaging, however given extreme breast density ultrasound will be performed. Targeted ultrasound of the right breast was performed. No suspicious masses or any other worrisome abnormalities identified, only extremely dense fibroglandular tissue seen. IMPRESSION: No mammographic evidence of malignancy in either breast. RECOMMENDATION: 1. If there is concern for a primary breast malignancy, then further evaluation with breast MRI with contrast would be recommended. 2.  Screening mammogram in one year.(Code:SM-B-01Y) I have discussed the findings and recommendations with the patient. If applicable, a reminder letter will be sent to the patient regarding the next appointment. BI-RADS CATEGORY  1: Negative. Electronically Signed   By: Everlean Alstrom M.D.   On: 07/29/2022 16:58  [place] CORE BIOPSY (LYMPH NODES)  Result Date: 07/21/2022 INDICATION: 37 year old female with history of enlarged right cervical lymph  node concerning for metastatic disease. EXAM: Ultrasound-guided core biopsy of cervical lymph node MEDICATIONS: None. ANESTHESIA/SEDATION: None. FLUOROSCOPY TIME:  None. COMPLICATIONS: None immediate. PROCEDURE: Informed written consent was obtained from the patient after a thorough discussion of the procedural risks, benefits and alternatives. All questions were addressed. Maximal Sterile Barrier Technique was utilized including caps, mask, sterile gowns, sterile gloves, sterile drape, hand hygiene and skin antiseptic. A timeout was performed prior to the initiation of the procedure. Preprocedure ultrasound evaluation  of the right neck demonstrated a large, heterogeneously hypoechoic, irregularly shaped lymph node measuring approximately 3.1 x 2.5 cm. The procedure was planned. Subdermal Local anesthesia was provided at the planned needle entry site with 1% lidocaine. Under direct ultrasound visualization, a 17 gauge coaxial introducer needle was positioned in the periphery of the lymph node. This was followed by a total of 2, 18 gauge core biopsy samples which were placed in formalin. The needle was removed and hemostasis was achieved with brief manual compression. Postprocedure ultrasound evaluation demonstrated no evidence of surrounding hematoma or other complicating features. The patient tolerated the procedure well was discharged home in good condition. IMPRESSION: Technically successful ultrasound-guided core biopsy of abnormally enlarged right cervical lymph node. Ruthann Cancer, MD [facility] Radiology Electronically Signed   By: Ruthann Cancer M.D.   On: 07/21/2022 12:03   [place] FINE NEEDLE ASP 1ST LESION  Result Date: 07/21/2022 INDICATION: 37 year old female with history of large pelvic cystic mass presenting for aspiration. EXAM: Ultrasound-guided pelvic cyst aspiration MEDICATIONS: The patient is currently admitted to the hospital and receiving intravenous antibiotics. The antibiotics were administered within an appropriate time frame prior to the initiation of the procedure. ANESTHESIA/SEDATION: None. COMPLICATIONS: None immediate. PROCEDURE: Informed written consent was obtained from the patient after a thorough discussion of the procedural risks, benefits and alternatives. All questions were addressed. Maximal Sterile Barrier Technique was utilized including caps, mask, sterile gowns, sterile gloves, sterile drape, hand hygiene and skin antiseptic. A timeout was performed prior to the initiation of the procedure. The left lower quadrant was prepped and draped in  standard fashion. Preprocedure ultrasound evaluation demonstrated large simple appearing pelvic cystic mass. Subdermal Local anesthesia was provided at the planned needle entry site. Deeper local anesthetic was provided to the level of the peritoneum under direct ultrasound visualization. A 10 cm Yueh catheter was then inserted under direct ultrasound guidance into the fluid collection. Aspiration was performed which yielded approximately 900 mL of translucent, slightly straw-colored fluid. A sample was sent for cytology. The needle was removed and hemostasis was achieved with brief manual compression. Postprocedure imaging demonstrated near complete resolution of the cystic collection without complicating features. The patient tolerated the procedure well was discharged in good condition. IMPRESSION: Technically successful ultrasound-guided aspiration of pelvic cyst yielding approximately 900 mL of translucent, lightly straw-colored fluid. A sample was sent for cytology. Ruthann Cancer, MD [facility] Radiology Electronically Signed   By: Ruthann Cancer M.D.   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  We discussed the natural history of *** and general treatment, highlighting the role of radiotherapy in the management.  We discussed the available radiation techniques, and focused on the details of logistics and delivery.  We reviewed the anticipated acute and late sequelae associated with radiation in this setting.  The patient was encouraged to ask questions that I answered to the best of my ability. *** A patient consent form was discussed and signed.  We retained a copy for our records.  The patient  would like to proceed with radiation and will be scheduled for CT simulation.  PLAN: ***    *** minutes of total time was spent for this patient encounter, including preparation, face-to-face counseling with the patient and coordination of care, physical exam, and documentation of the encounter.   ------------------------------------------------  Blair Promise, PhD, MD  This document serves as a record of services personally performed by Gery Pray, MD. It was created on his behalf by Roney Mans, a trained medical scribe. The creation of this record is based on the scribe's personal observations and the provider's statements to them. This document has been checked and approved by the attending provider.

## 2022-08-08 NOTE — Progress Notes (Signed)
   08/08/22 0807  Assess: MEWS Score  Temp 98.4 F (36.9 C)  BP 121/73  MAP (mmHg) 86  Pulse Rate (!) 115  Resp 18  Level of Consciousness Alert  SpO2 100 %  O2 Device Room Air  Assess: MEWS Score  MEWS Temp 0  MEWS Systolic 0  MEWS Pulse 2  MEWS RR 0  MEWS LOC 0  MEWS Score 2  MEWS Score Color Yellow  Assess: if the MEWS score is Yellow or Red  Were vital signs taken at a resting state? Yes  Focused Assessment No change from prior assessment  Does the patient meet 2 or more of the SIRS criteria? Yes  Treat  Pain Intervention(s) Medication (See eMAR)  Neuro symptoms relieved by Rest  Take Vital Signs  Increase Vital Sign Frequency  Yellow: Q 2hr X 2 then Q 4hr X 2, if remains yellow, continue Q 4hrs  Escalate  MEWS: Escalate Yellow: discuss with charge nurse/RN and consider discussing with provider and RRT  Notify: Charge Nurse/RN  Name of Charge Nurse/RN Notified Woody RN  Date Charge Nurse/RN Notified 08/08/22  Time Charge Nurse/RN Notified [phone]  Document  Patient Outcome Stabilized after interventions  Assess: SIRS CRITERIA  SIRS Temperature  0  SIRS Pulse 1  SIRS Respirations  0  SIRS WBC 1  SIRS Score Sum  2

## 2022-08-08 NOTE — Hospital Course (Addendum)
Taken from H&P.  Natalie Hinton is a 37 y.o. female with medical history significant of metastatic CA of unknown primary was brought to ED with confusion and abnormal eye movements for the past 2 days. Patient was also recently started on pain medications about 10 days ago. Patient was also experiencing headaches which wakes her up at night, improves while more upright for the past 3 weeks.  Poor p.o. intake with some nausea and vomiting.  ED course.  Patient was found to be cachectic with muscle wasting, abnormal eye movements and left lower facial droop.  Labs pertinent for mild hyponatremia with sodium at 130, potassium 2.9, CBC with leukocytosis at 13.9 which seems improved than most recent check of 16.5, approximately 3 weeks ago. CTA head and neck = IMPRESSION: 1. 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 No proximal large vessel occlusion. 2. No cervical arterial occlusion or stenosis. 3. Bulky necrotic mediastinal lymphadenopathy and numerous large bilateral pulmonary metastases. 4. Partially necrotic right supraclavicular lymph node, likely metastatic. MRI ordered with concern of brain mets-pending. Neurology and palliative care was also consulted.  High risk for brain mets versus leptomeningeal disease.  2/5: Patient continued to have significant pain.  Requiring Dilaudid every 2 hours.  Going for another attempt of MRI.  Had second unsuccessful attempt yesterday as she was unable to lay still due to.  Neurology is aware. Discussed with Dr. Lorenso Courier from oncology and Dr. Dwana Curd from radiation oncology. Patient has her radiation oncology appointment at 2:30 PM today for simulation followed by start of radiation from tomorrow hopefully if she is able to lay still. Palliative care was also consulted yesterday.  Patient is being transferred to [facility] for further management.  Had a long discussion with fianc and brother in the room regarding poor  prognosis and palliative care involvement as advised by Dr. Lorenso Courier.

## 2022-08-08 NOTE — ED Provider Notes (Signed)
I assumed care of this patient.  Please see previous provider note for further details of Hx, PE.  Briefly patient is a 37 y.o. female with h/o metastatic disease from unknown primary here for AMS and possible CN palsy. Pending MRI.  Admitted to medicine pending MRI. Neurology following      Fatima Blank, MD 08/08/22 [phone]

## 2022-08-08 NOTE — Consult Note (Addendum)
Palliative Care Consult Note                                  Date: 08/08/2022   Patient Name: Natalie Hinton  DOB: Feb 12, 1986  MRN: [id number]  Age / Sex: 37 y.o., female  PCP: Natalie Crook, MD Referring Physician: Lorella Nimrod, MD  Reason for Consultation: Establishing goals of care  HPI/Patient Profile: 37 y.o. 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  She presented to [facility] ED on 08/07/2022 with altered mental status and abnormal eye movements.   CTA head showed bulky necrotic mediastinal lymphadenopathy and numerous large bilateral pulmonary metastases as well as necrotic right supraclavicular lymph node. Admitted to [facility] service for further evaluation of acute metabolic encephalopathy.  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  Palliative Medicine was consulted for goals of care in the setting of metastatic cancer.   Subjective:   I have reviewed medical records including progress notes, labs and imaging, and assessed the patient at bedside. She is confused and unable to participate in [place] discussion at this time.   Present at bedside are her significant other Natalie Hinton"), her sister, and her aunt. Natalie Hinton does not want to leave patient alone in the room, so I met with sister and aunt in the 6N conference room to discuss diagnosis, prognosis, and goals of care.  I introduced Palliative Medicine as specialized medical care for people living with serious illness. It focuses on providing relief from the symptoms and stress of a serious illness.   A brief life review was discussed. Natalie Hinton and Natalie Hinton have been together for 6 years. She has a 33 year old son from a previous relationship. Natalie Hinton's mother raised her son and has custody of him.   We discussed patient's current illness and what it means in the larger context of her  ongoing co-morbidities. Current clinical status was reviewed. Natural disease trajectory of metastatic cancer was discussed, emphasizing this is a non-curable condition and intent of treatment is palliative.  Created space and opportunity for family to explore thoughts and feelings regarding current medical situation. Values and goals of care were attempted to be elicited.  At this time family are would like to continue current medical interventions with watchful waiting.  They hope to gain answers regarding patient's confusion through continued diagnostic work-up and will make stepwise decisions based off of that information.    Discussed the importance of continued conversation with family and the medical team regarding overall plan of care and treatment options.     Review of Systems  Unable to perform ROS   Objective:   Primary Diagnoses: Present on Admission:  Metastatic cancer ([place])  Acute encephalopathy   Physical Exam Vitals reviewed.  Constitutional:      Appearance: She is cachectic. She is ill-appearing.  Pulmonary:     Effort: Pulmonary effort is normal.  Neurological:     Mental Status: She is lethargic and confused.     Vital Signs:  BP (!) 138/92 (BP Location: Left Arm)   Pulse (!) 118   Temp 99.5 F (37.5 C) (Oral)   Resp 18   Ht 5' (1.524 m)   Wt 31.8 kg   SpO2 100%   BMI 13.67 kg/m   Palliative Assessment/Data: PPS 20-30%     Assessment & Plan:   SUMMARY OF RECOMMENDATIONS  Full scope treatment  Follow-up GOC discussion with family in the next 1-2 days  Primary Decision Maker: No documented HCPOA Per [place] law, next of kin is reasonably available parents followed by reasonably available adult siblings (this would be patient's mother followed by her sister).  Sister defers to patient's significant other for daily communication/updates For any major decisions, family will discuss together  Code Status/Advance Care Planning: Full  code  Symptom Management:  Dilaudid 1 mg IV every 2 hours as needed for pain  Prognosis:  Unable to determine  Discharge Planning:  To Be Determined     Thank you for allowing [place] to participate in the care of Natalie Hinton  MDM - High  Signed by: Elie Confer, NP Palliative Medicine Team  Team Phone # [phone]  For individual providers, please see AMION

## 2022-08-08 NOTE — Progress Notes (Signed)
2nd medicated attempt at MRI. Able to obtain limited MRI brain. Completed as limited study and order changed to WO to reflect pt inability to reach contrasted portion of exam. Unable to obtain any spine imaging. Attempted spine imaging, completely non-diagnostic. Pt moving around continuously, jerking and rasing up whole body. Pt eventually raising up arms grabbing head coil required for imaging and moving legs off imaging table. Pt in extreme pain. Situation became unsafe and further scanning was aborted.

## 2022-08-08 NOTE — Procedures (Addendum)
Patient Name: Natalie Hinton  MRN: [id number]  Epilepsy Attending: Lora Havens  Referring Physician/Provider: Lorenza Chick, MD  Date: 08/08/2022 Duration: 28.23 mins  Patient history: 37yo F with ams. EEG to evaluate for seizure  Level of alertness: Awake, asleep  AEDs during EEG study: None  Technical aspects: This EEG study was done with scalp electrodes positioned according to the 10-20 International system of electrode placement. Electrical activity was reviewed with band pass filter of 1-'70Hz'$ , sensitivity of 7 uV/mm, display speed of 39m/sec with a '60Hz'$  notched filter applied as appropriate. EEG data were recorded continuously and digitally stored.  Video monitoring was available and reviewed as appropriate.  Description: The posterior dominant rhythm consists of 7.5 Hz activity of moderate voltage (25-35 uV) seen predominantly in posterior head regions, symmetric and reactive to eye opening and eye closing. Sleep was characterized by vertex waves, sleep spindles (12 to 14 Hz), maximal frontocentral region. EEG showed intermittent generalized 3 to 6 Hz theta-delta slowing. Physiologic photic driving was not seen during photic stimulation. Hyperventilation was not performed.     ABNORMALITY - Intermittent slow, generalized  IMPRESSION: This study is suggestive of mild diffuse encephalopathy, nonspecific etiology. No seizures or epileptiform discharges were seen throughout the recording.  Josias Tomerlin OBarbra Sarks

## 2022-08-08 NOTE — Progress Notes (Signed)
EEG complete - results pending 

## 2022-08-08 NOTE — ED Notes (Signed)
ED TO INPATIENT HANDOFF REPORT  ED Nurse Name and Phone #:  [phone] Name/Age/Gender Natalie Hinton 37 y.o. female Room/Bed: 019C/019C  Code Status   Code Status: Full Code  Home/SNF/Other Home Patient oriented to: self, place, time, and situation Is this baseline? Yes   Triage Complete: Triage complete  Chief Complaint Metastatic cancer [facility]) [C79.9]  Triage Note Patient bib [facility] after family called to report hallucinations after taking too much gabapentin a few days ago. Patient is alert and oriented x4. She has not been eating or drinking and has lost 30 pounds the past month. She has been diagnosed with cervical cancer and has not started treatments yet. She had a EGD yesterday where they found an infection in her stomach. Today she is weak, and having some hallucinations, [facility] reports she talks to herself randomly.    Allergies Allergies  Allergen Reactions   Penicillins Hives   Gabapentin Other (See Comments)    hallucinations    Level of Care/Admitting Diagnosis ED Disposition     ED Disposition  Admit   Condition  --   Comment  Hospital Area: [facility] [100100]  Level of Care: Telemetry Medical [104]  May place patient in observation at [facility] or [facility] if equivalent level of care is available:: No  Covid Evaluation: Asymptomatic - no recent exposure (last 10 days) testing not required  Diagnosis: Metastatic cancer [facility]) [622297]  Admitting Physician: Etta Quill [phone]  Attending Physician: Etta Quill [phone]          B Medical/Surgery History Past Medical History:  Diagnosis Date   Allergy    Anxiety    Thyroid disease    History reviewed. No pertinent surgical history.   A IV Location/Drains/Wounds Patient Lines/Drains/Airways Status     Active Line/Drains/Airways     Name Placement date Placement time Site Days   Peripheral IV 08/07/22 20 G Anterior;Right Forearm 08/07/22  2138  Forearm  1             Intake/Output Last 24 hours  Intake/Output Summary (Last 24 hours) at 08/08/2022 0434 Last data filed at 08/08/2022 1740 Gross per 24 hour  Intake 150 ml  Output --  Net 150 ml    Labs/Imaging Results for orders placed or performed during the hospital encounter of 08/07/22 (from the past 48 hour(s))  Comprehensive metabolic panel     Status: Abnormal   Collection Time: 08/07/22  9:23 PM  Result Value Ref Range   Sodium 130 (L) 135 - 145 mmol/L   Potassium 2.9 (L) 3.5 - 5.1 mmol/L   Chloride 92 (L) 98 - 111 mmol/L   CO2 25 22 - 32 mmol/L   Glucose, Bld 79 70 - 99 mg/dL    Comment: Glucose reference range applies only to samples taken after fasting for at least 8 hours.   BUN 7 6 - 20 mg/dL   Creatinine, Ser 0.52 0.44 - 1.00 mg/dL   Calcium 9.4 8.9 - 10.3 mg/dL   Total Protein 7.0 6.5 - 8.1 g/dL   Albumin 2.6 (L) 3.5 - 5.0 g/dL   AST 37 15 - 41 U/L   ALT 23 0 - 44 U/L   Alkaline Phosphatase 65 38 - 126 U/L   Total Bilirubin 1.2 0.3 - 1.2 mg/dL   GFR, Estimated >60 >60 mL/min    Comment: (NOTE) Calculated using the CKD-EPI Creatinine Equation (2021)    Anion gap 13 5 - 15  Comment: Performed at [facility], [place] [address] [place], [place] [address]  CBC     Status: Abnormal   Collection Time: 08/07/22  9:23 PM  Result Value Ref Range   WBC 13.9 (H) 4.0 - 10.5 K/uL   RBC 4.01 3.87 - 5.11 MIL/uL   Hemoglobin 11.5 (L) 12.0 - 15.0 g/dL   HCT 34.3 (L) 36.0 - 46.0 %   MCV 85.5 80.0 - 100.0 fL   MCH 28.7 26.0 - 34.0 pg   MCHC 33.5 30.0 - 36.0 g/dL   RDW 14.6 11.5 - 15.5 %   Platelets 499 (H) 150 - 400 K/uL   nRBC 0.0 0.0 - 0.2 %    Comment: Performed at [facility], [place] [address] [place], [place] [address]  Ammonia     Status: None   Collection Time: 08/07/22  9:23 PM  Result Value Ref Range   Ammonia 26 9 - 35 umol/L    Comment: Performed at [facility], [place] [address] [place], [place] [address]  Ethanol     Status: None   Collection  Time: 08/07/22  9:23 PM  Result Value Ref Range   Alcohol, Ethyl (B) <10 <10 mg/dL    Comment: (NOTE) Lowest detectable limit for serum alcohol is 10 mg/dL.  For medical purposes only. Performed at [facility], [place] [address], [place], [place] [address]   Vitamin B12     Status: Abnormal   Collection Time: 08/07/22  9:23 PM  Result Value Ref Range   Vitamin B-12 1,315 (H) 180 - 914 pg/mL    Comment: (NOTE) This assay is not validated for testing neonatal or myeloproliferative syndrome specimens for Vitamin B12 levels. Performed at [facility], [place] [address], [place], [place] [address]   Folate     Status: None   Collection Time: 08/07/22  9:23 PM  Result Value Ref Range   Folate 10.9 >5.9 ng/mL    Comment: Performed at [facility], [place] [address] [place], [place] [address]  Magnesium     Status: None   Collection Time: 08/07/22  9:23 PM  Result Value Ref Range   Magnesium 1.9 1.7 - 2.4 mg/dL    Comment: Performed at [facility], [place] [address], [place],  [address]  I-Stat beta hCG blood, ED     Status: None   Collection Time: 08/07/22  9:25 PM  Result Value Ref Range   I-stat hCG, quantitative <5.0 <5 mIU/mL   Comment 3            Comment:   GEST. AGE      CONC.  (mIU/mL)   <=1 WEEK        5 - 50     2 WEEKS       50 - 500     3 WEEKS       100 - 10,000     4 WEEKS     1,000 - 30,000        FEMALE AND NON-PREGNANT FEMALE:     LESS THAN 5 mIU/mL   CBG monitoring, ED     Status: None   Collection Time: 08/07/22  9:51 PM  Result Value Ref Range   Glucose-Capillary 80 70 - 99 mg/dL    Comment: Glucose reference range applies only to samples taken after fasting for at least 8 hours.   CT ANGIO HEAD NECK W WO CM  Result Date: 08/07/2022 CLINICAL DATA:  Right-sided Horner syndrome EXAM: CT ANGIOGRAPHY HEAD AND NECK TECHNIQUE: Multidetector CT imaging of the head and neck was performed using the standard protocol during bolus  administration of intravenous contrast. Multiplanar CT image reconstructions and MIPs were obtained to evaluate the vascular anatomy. Carotid stenosis measurements (when applicable) are obtained utilizing NASCET criteria, using the distal internal carotid diameter as the denominator. RADIATION DOSE REDUCTION: This exam was performed according to the departmental dose-optimization program which includes automated exposure control, adjustment of the mA and/or kV according to patient size and/or use of iterative reconstruction technique. CONTRAST:  89m OMNIPAQUE IOHEXOL 350 MG/ML SOLN COMPARISON:  None Available. FINDINGS: CT HEAD FINDINGS Brain: There is no mass, hemorrhage or extra-axial collection. The size and configuration of the ventricles and extra-axial CSF spaces are normal. There is no acute or chronic infarction. The brain parenchyma is normal. Skull: The visualized skull base, calvarium and extracranial soft tissues are normal. Sinuses/Orbits: No fluid levels or advanced mucosal thickening of the visualized paranasal sinuses. No mastoid or middle ear effusion. The orbits are normal. CTA NECK FINDINGS SKELETON: There is no bony spinal canal stenosis. No lytic or blastic lesion. OTHER NECK: Partially necrotic right supraclavicular node measures 2.3 cm (7:119). UPPER CHEST: Bulky necrotic mediastinal lymph nodes measuring up to 2.5 cm. Numerous large bilateral pulmonary metastases. AORTIC ARCH: There is no calcific atherosclerosis of the aortic arch. There is no aneurysm, dissection or hemodynamically significant stenosis of the visualized portion of the aorta. Conventional 3 vessel aortic branching pattern. The visualized proximal subclavian arteries are widely patent. RIGHT CAROTID SYSTEM: Normal without aneurysm, dissection or stenosis. LEFT CAROTID SYSTEM: Normal without aneurysm, dissection or stenosis. VERTEBRAL ARTERIES: Left dominant configuration. Both origins are clearly patent. There is no  dissection, occlusion or flow-limiting stenosis to the skull base (V1-V3 segments). CTA HEAD FINDINGS POSTERIOR CIRCULATION: --Vertebral arteries: Normal V4 segments. --Inferior cerebellar arteries: Normal. --Basilar artery: Normal. --Superior cerebellar arteries: Normal. --Posterior cerebral arteries (PCA): Normal. ANTERIOR CIRCULATION: --Intracranial internal carotid arteries: Severe narrowing of both internal carotid arteries at the skull base. --Anterior cerebral arteries (ACA): Normal. Hypoplastic left A1 segment, normal variant --Middle cerebral arteries (MCA): Normal. VENOUS SINUSES: As permitted by contrast timing, patent. ANATOMIC VARIANTS: None Review of the MIP images confirms the above findings. IMPRESSION: 1. 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 No proximal large vessel occlusion. 2. No cervical arterial occlusion or stenosis. 3. Bulky necrotic mediastinal lymphadenopathy and numerous large bilateral pulmonary metastases. 4. Partially necrotic right supraclavicular lymph node, likely metastatic. Electronically Signed   By: KUlyses JarredM.D.   On: 08/07/2022 22:37   DG Chest Portable 1 View  Result Date: 08/07/2022 CLINICAL DATA:  Horner syndrome EXAM: PORTABLE CHEST 1 VIEW COMPARISON:  07/07/2022 CT and chest x-ray. FINDINGS: Innumerable bilateral pulmonary nodules again noted, similar to prior study. Heart is normal size. No effusions. No acute bony abnormality. IMPRESSION: Innumerable pulmonary nodules, unchanged. Electronically Signed   By: KRolm BaptiseM.D.   On: 08/07/2022 22:02    Pending Labs Unresulted Labs (From admission, onward)     Start     Ordered   08/08/22 0500  CBC  Tomorrow morning,   R        08/08/22 0400   08/08/22 06226 Basic metabolic panel  Tomorrow morning,   R        08/08/22 0400   08/08/22 0401  Magnesium  Once,   R  08/08/22 0400   08/08/22 0401  Phosphorus  Once,   R        08/08/22 0400   08/08/22 0358  HIV Antibody  (routine testing w rflx)  (HIV Antibody (Routine testing w reflex) panel)  Once,   R        08/08/22 0400   08/07/22 2253  Vitamin B1  Add-on,   AD        08/07/22 2253   08/07/22 2043  Urinalysis, Routine w reflex microscopic -Urine, Clean Catch  Once,   URGENT       Question:  Specimen Source  Answer:  Urine, Clean Catch   08/07/22 2042   08/07/22 2043  Rapid urine drug screen (hospital performed)  ONCE - STAT,   STAT        08/07/22 2042            Vitals/Pain Today's Vitals   08/08/22 0300 08/08/22 0345 08/08/22 0358 08/08/22 0402  BP: (!) 134/95   131/89  Pulse: (!) 106   (!) 105  Resp: 20   20  Temp:  98 F (36.7 C)    TempSrc:      SpO2: 99%   98%  Weight:      Height:      PainSc:   10-Worst pain ever     Isolation Precautions No active isolations  Medications Medications  potassium chloride 10 mEq in 100 mL IVPB (10 mEq Intravenous Not Given 0/6/30 1601)  folic acid (FOLVITE) tablet 1 mg (has no administration in time range)  multivitamin with minerals tablet 1 tablet (has no administration in time range)  thiamine (VITAMIN B1) 500 mg in sodium chloride 0.9 % 50 mL IVPB (0 mg Intravenous Stopped 08/08/22 0219)  HYDROmorphone (DILAUDID) injection 1 mg (1 mg Intravenous Given 08/08/22 0358)  diazepam (VALIUM) injection 2.5 mg (has no administration in time range)  acetaminophen (TYLENOL) tablet 650 mg (has no administration in time range)    Or  acetaminophen (TYLENOL) suppository 650 mg (has no administration in time range)  ondansetron (ZOFRAN) tablet 4 mg (has no administration in time range)    Or  ondansetron (ZOFRAN) injection 4 mg (has no administration in time range)  oxyCODONE (Oxy IR/ROXICODONE) immediate release tablet 10 mg (10 mg Oral Given 08/07/22 2149)  potassium chloride SA (KLOR-CON M) CR tablet 40 mEq (40 mEq Oral Given 08/07/22 2314)  iohexol (OMNIPAQUE) 350 MG/ML injection 75 mL (75 mLs Intravenous Contrast Given 08/07/22 2223)  LORazepam (ATIVAN)  tablet 0.5 mg (0.5 mg Oral Given 08/08/22 0014)  potassium chloride 10 mEq in 100 mL IVPB (0 mEq Intravenous Stopped 08/08/22 0353)    Mobility walks     Focused Assessments Neuro Assessment Handoff:  Swallow screen pass? Yes          Neuro Assessment:   Neuro Checks:      Has TPA been given? No If patient is a Neuro Trauma and patient is going to OR before floor call report to [place] nurse: [phone] or [phone]   R Recommendations: See Admitting Provider Note  Report given to:   Additional Notes: Waiting for MRI, needs PRN medication prior to scan

## 2022-08-08 NOTE — Progress Notes (Signed)
   08/08/22 0807  Assess: MEWS Score  Temp 98.4 F (36.9 C)  BP 121/73  MAP (mmHg) 86  Pulse Rate (!) 115  Resp 18  Level of Consciousness Alert  SpO2 100 %  O2 Device Room Air  Assess: MEWS Score  MEWS Temp 0  MEWS Systolic 0  MEWS Pulse 2  MEWS RR 0  MEWS LOC 0  MEWS Score 2  MEWS Score Color Yellow  Assess: if the MEWS score is Yellow or Red  Were vital signs taken at a resting state? Yes  Focused Assessment No change from prior assessment  Does the patient meet 2 or more of the SIRS criteria? No  Treat  Pain Intervention(s) Medication (See eMAR)  Neuro symptoms relieved by Rest  Take Vital Signs  Increase Vital Sign Frequency  Yellow: Q 2hr X 2 then Q 4hr X 2, if remains yellow, continue Q 4hrs  Escalate  MEWS: Escalate Yellow: discuss with charge nurse/RN and consider discussing with provider and RRT  Notify: Charge Nurse/RN  Name of Charge Nurse/RN Notified Woody RN  Date Charge Nurse/RN Notified 08/08/22  Time Charge Nurse/RN Notified [phone]  Document  Patient Outcome Stabilized after interventions  Assess: SIRS CRITERIA  SIRS Temperature  0  SIRS Pulse 1  SIRS Respirations  0  SIRS WBC 1  SIRS Score Sum  2

## 2022-08-09 ENCOUNTER — Ambulatory Visit
Admission: RE | Admit: 2022-08-09 | Discharge: 2022-08-09 | Disposition: A | Discharge status: Home / Self Care | Payer: Medicaid Other | Source: Ambulatory Visit | Attending: Radiation Oncology | Admitting: Radiation Oncology

## 2022-08-09 ENCOUNTER — Telehealth: Payer: Self-pay

## 2022-08-09 ENCOUNTER — Inpatient Hospital Stay (HOSPITAL_COMMUNITY): Payer: Medicaid Other

## 2022-08-09 ENCOUNTER — Inpatient Hospital Stay: Payer: BLUE CROSS/BLUE SHIELD | Admitting: Physician Assistant

## 2022-08-09 ENCOUNTER — Inpatient Hospital Stay: Payer: BLUE CROSS/BLUE SHIELD

## 2022-08-09 VITALS — BP 123/81 | HR 119 | Temp 97.9°F | Resp 20

## 2022-08-09 DIAGNOSIS — C770 Secondary and unspecified malignant neoplasm of lymph nodes of head, face and neck: Secondary | ICD-10-CM | POA: Insufficient documentation

## 2022-08-09 DIAGNOSIS — C7801 Secondary malignant neoplasm of right lung: Secondary | ICD-10-CM | POA: Insufficient documentation

## 2022-08-09 DIAGNOSIS — G9340 Encephalopathy, unspecified: Secondary | ICD-10-CM | POA: Diagnosis not present

## 2022-08-09 DIAGNOSIS — C799 Secondary malignant neoplasm of unspecified site: Secondary | ICD-10-CM

## 2022-08-09 DIAGNOSIS — R1900 Intra-abdominal and pelvic swelling, mass and lump, unspecified site: Secondary | ICD-10-CM

## 2022-08-09 DIAGNOSIS — E43 Unspecified severe protein-calorie malnutrition: Secondary | ICD-10-CM | POA: Diagnosis not present

## 2022-08-09 DIAGNOSIS — Z515 Encounter for palliative care: Secondary | ICD-10-CM | POA: Diagnosis not present

## 2022-08-09 DIAGNOSIS — C7802 Secondary malignant neoplasm of left lung: Secondary | ICD-10-CM | POA: Insufficient documentation

## 2022-08-09 DIAGNOSIS — C801 Malignant (primary) neoplasm, unspecified: Secondary | ICD-10-CM | POA: Insufficient documentation

## 2022-08-09 DIAGNOSIS — Z510 Encounter for antineoplastic radiation therapy: Secondary | ICD-10-CM | POA: Insufficient documentation

## 2022-08-09 DIAGNOSIS — C7951 Secondary malignant neoplasm of bone: Secondary | ICD-10-CM | POA: Insufficient documentation

## 2022-08-09 MED ORDER — GADOBUTROL 1 MMOL/ML IV SOLN
5.0000 mL | Freq: Once | INTRAVENOUS | Status: AC | PRN
  Administered 2022-08-09: 5 mL via INTRAVENOUS

## 2022-08-09 MED ORDER — ENSURE ENLIVE PO LIQD
237.0000 mL | Freq: Three times a day (TID) | ORAL | Status: DC
  Administered 2022-08-09 – 2022-08-16 (×10): 237 mL via ORAL

## 2022-08-09 MED ORDER — HYDROMORPHONE HCL 1 MG/ML IJ SOLN
1.0000 mg | Freq: Once | INTRAMUSCULAR | Status: AC
  Administered 2022-08-09: 1 mg via INTRAVENOUS
  Filled 2022-08-09: qty 1

## 2022-08-09 MED ORDER — LORAZEPAM 2 MG/ML IJ SOLN
2.0000 mg | Freq: Once | INTRAMUSCULAR | Status: AC
  Administered 2022-08-09: 2 mg via INTRAVENOUS
  Filled 2022-08-09: qty 1

## 2022-08-09 MED ORDER — HYDROMORPHONE HCL 1 MG/ML IJ SOLN
INTRAMUSCULAR | Status: AC
  Filled 2022-08-09: qty 1

## 2022-08-09 NOTE — Assessment & Plan Note (Signed)
Of unknown primary.  Patient is very cachectic and having a lot of pain. Oncology and radiation oncology is aware. Being transferred to [facility] for further management

## 2022-08-09 NOTE — Progress Notes (Signed)
                                                                                                                                                                                                          Palliative Medicine Progress Note   Patient Name: Natalie Hinton       Date: 08/09/2022 DOB: 12-09-1985  Age: 37 y.o. MRN#: [id number] Attending Physician: Mercy Riding, MD Primary Care Physician: [name], MD Admit Date: 08/07/2022  Reason for Consultation/Follow-up: {Reason for Consult:23484}  HPI/Patient Profile: 37 y.o. 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  She presented to [facility] ED on 08/07/2022 with altered mental status and abnormal eye movements.   CTA head showed bulky necrotic mediastinal lymphadenopathy and numerous large bilateral pulmonary metastases as well as necrotic right supraclavicular lymph node. Admitted to [facility] service for further evaluation of acute metabolic encephalopathy.  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   Palliative Medicine was consulted for goals of care in the setting of metastatic cancer.  Subjective: Chart reviewed. Patient transferred to [facility] today to start radiation.   Objective:  Physical Exam             Palliative Assessment/Data: ***     Palliative Medicine Assessment & Plan   Assessment: Principal Problem:   Acute encephalopathy Active Problems:   Metastatic cancer (HCC)   Protein-calorie malnutrition, severe    Recommendations/Plan: ***  Goals of Care and Additional Recommendations: Limitations on Scope of Treatment: {Recommended Scope and Preferences:21019}  Code Status:   Prognosis:  {Palliative Care Prognosis:23504}  Discharge Planning: {Palliative dispostion:23505}  Care plan was discussed with ***  Thank you for allowing the Palliative Medicine Team to assist in the care of this  patient.   ***   Lavena Bullion, NP   Please contact Palliative Medicine Team phone at [phone] for questions and concerns.  For individual providers, please see AMION.

## 2022-08-09 NOTE — Progress Notes (Signed)
Report called to [place], report given to [employer].

## 2022-08-09 NOTE — Progress Notes (Signed)
Rn spoke with Patients Rn an she states that it is ok for the patient to come down for her Ct simulation. Transporter is going to get patient.

## 2022-08-09 NOTE — Progress Notes (Signed)
Hematology/Oncology Progress Note  Clinical Summary: Mrs. Natalie Hinton is a 37 year old female with widely metastatic cancer of unclear etiology who is admitted with altered mental status.   Interval History: -- Presented to the emergency department on 08/07/2022 with hallucinations.  Both visual and auditory. -- MRI with contrast performed yesterday but difficult to interpret due to motion artifact. -- MRI total spine mets performed showed no evidence of leptomeningeal disease, however MRI brain showed large intraparotid sellar lesion extending into the bilateral cavernous sinuses and sphenoid sinuses resulting in narrowing of the bilateral internal carotid arteries. -- On exam today Ms. Douville is accompanied by her fianc.  She is somnolent. -- Endorses having vision changes of right eye, headache, but no pain elsewhere. -- Denies any nausea, vomiting, or diarrhea.  O:  Vitals:   08/09/22 1324 08/09/22 1725  BP: 124/88 131/86  Pulse: (!) 110 (!) 114  Resp: 16 14  Temp: 99.1 F (37.3 C) 97.8 F (36.6 C)  SpO2: 94% 94%      Latest Ref Rng & Units 08/08/2022    4:45 AM 08/07/2022    9:23 PM 07/14/2022    2:20 PM  CMP  Glucose 70 - 99 mg/dL 99  79  91   BUN 6 - 20 mg/dL '5  7  8   '$ Creatinine 0.44 - 1.00 mg/dL 0.48  0.52  0.49   Sodium 135 - 145 mmol/L 133  130  137   Potassium 3.5 - 5.1 mmol/L 3.9  2.9  4.3   Chloride 98 - 111 mmol/L 95  92  100   CO2 22 - 32 mmol/L '25  25  27   '$ Calcium 8.9 - 10.3 mg/dL 9.3  9.4  9.1   Total Protein 6.5 - 8.1 g/dL  7.0  8.0   Total Bilirubin 0.3 - 1.2 mg/dL  1.2  <0.1   Alkaline Phos 38 - 126 U/L  65  87   AST 15 - 41 U/L  37  25   ALT 0 - 44 U/L  23  17       Latest Ref Rng & Units 08/08/2022    4:45 AM 08/07/2022    9:23 PM 07/14/2022    2:20 PM  CBC  WBC 4.0 - 10.5 K/uL 13.0  13.9  16.5   Hemoglobin 12.0 - 15.0 g/dL 11.2  11.5  14.8   Hematocrit 36.0 - 46.0 % 33.0  34.3  45.1   Platelets 150 - 400 K/uL 518  499  405       GENERAL:  Chronically ill-appearing severely cachectic Caucasian female in NAD  SKIN: skin color, texture, turgor are normal, no rashes or significant lesions EYES: conjunctiva are pink and non-injected, sclera clear LUNGS: clear to auscultation and percussion with normal breathing effort HEART: regular rate & rhythm and no murmurs and no lower extremity edema Musculoskeletal: no cyanosis of digits and no clubbing  PSYCH: alert & oriented x 3, fluent speech but somnolent. NEURO: no focal motor/sensory deficits  Assessment/Plan:  # Metastatic Carcinoma of Unclear Etiology # Intraparotid Sellar Lesion  -- At this time given patient's deconditioned state, severe metastatic disease of unclear etiology, and large brain metastasis I would recommend pursuing comfort based care. -- Agree with consultation to palliative care. -- Radiation oncology is currently on board, will request that they assess her brain lesion as well. -- Discussed the poor prognosis and recommendations for comfort based care with patient and her fianc. -- Hematology/oncology will  continue to follow.   Ledell Peoples, MD Department of Hematology/Oncology [place] [facility] Phone: [phone] Pager: [phone] Email: [email or web address]

## 2022-08-09 NOTE — Progress Notes (Signed)
Patient is in the hospital today.Rn called the  nurse for patient to  see if patient would be able to come for her consult with Dr. Sondra Come today. Rn states that they were waiting for the oncologist to come and see the patient to determine if she would be coming to the appointment.Rn gave patients nurse Dr. Sondra Come number so that the oncologist could call doctor Kinard once he assess the patient and determine the patients plan of care.

## 2022-08-09 NOTE — Progress Notes (Addendum)
Initial Nutrition Assessment  DOCUMENTATION CODES:   Severe malnutrition in context of acute illness/injury  INTERVENTION:   Ensure Enlive po TID, each supplement provides 350 kcal and 20 grams of protein.  Magic cup TID with meals, each supplement provides 290 kcal and 9 grams of protein  Encourage PO intake at meals, reviewed menu ordering/additional options with fiance  Discussed importance of adequate nutrition  Notify outpatient cancer center RD  NUTRITION DIAGNOSIS:   Severe Malnutrition related to acute illness (newly dx cancer) as evidenced by energy intake < or equal to 50% for > or equal to 5 days, 36 percent weight loss in 1 month.   GOAL:   Patient will meet greater than or equal to 90% of their needs  MONITOR:   PO intake, Supplement acceptance  REASON FOR ASSESSMENT:   Malnutrition Screening Tool    ASSESSMENT:   Pt with PMH of anxiety and thyroid disease, currently uses marijuana, recently (07/2022) dx with metastatic cancer of unknown primary admitted with acute encephalopathy, per MD worrisome for leptomeningeal carcinomatosis.    Per oncology notes pt with complex pelvis masses and imaging concerning for metastatic disease with innumerable pulmonary nodules, mediastinal, thoracic and cervical adenopathy and destructive osseous pubic ramus lesion.  Per MD notes pain continues to be an issue. First attempt at MRI was unsuccessful, pt currently down for second attempt at MRI.  Palliative care consulted due to poor prognosis.  Pt to transfer to [facility] for XRT.   Spoke with fiance, whom she lives with, and her cousin. They provide hx and report that her usual weight is 110 lb, confirmed with chart review. They report that she developed poor appetite and starting losing weight after Christmas, about a month PTA. She has lost 40 lb, about 36% of her weight.  She has been eating poorly during this time, she only takes a few bites at meals, fiance states that she says  she will get back to the meal to finish it but never does. He has recently been giving her ensure, she prefers chocolate at home and she has drank ~ 2 per day recently.  She has received one ensure here and drank it all. Breakfast at bedside, she took a few bites of her pancake and drank her juice.    Medications reviewed and include: folic acid, MVI with minerals IV thimine 500 mg TID LR @ 100 ml/hr   Labs reviewed: Na 133 Vitamin B12 1315    NUTRITION - FOCUSED PHYSICAL EXAM:  Deferred; pt out of room in MRI x 2  Diet Order:   Diet Order             Diet regular Room service appropriate? Yes with Assist; Fluid consistency: Thin  Diet effective now                   EDUCATION NEEDS:   Education needs have been addressed  Skin:  Skin Assessment: Reviewed RN Assessment  Last BM:  unknown  Height:   Ht Readings from Last 1 Encounters:  08/07/22 5' (1.524 m)    Weight:   Wt Readings from Last 1 Encounters:  08/07/22 31.8 kg    BMI:  Body mass index is 13.67 kg/m.  Estimated Nutritional Needs:   Kcal:  1300-1600  Protein:  65-80 grams  Fluid:  >1.5 L/day  Lockie Pares., RD, LDN, CNSC See AMiON for contact information

## 2022-08-09 NOTE — Progress Notes (Signed)
Rn called to see if patient is going to make her appointment with Dr. Sondra Come today.Bermu Rn on the unit states that patient is waiting to be transported to [place] [facility] by care link . Will notify Dr. Sondra Come.

## 2022-08-09 NOTE — Progress Notes (Signed)
Progress Note   Patient: Natalie Hinton HWE:[id number] DOB: 04/07/1986 DOA: 08/07/2022     1 DOS: the patient was seen and examined on 08/09/2022   Brief hospital course: Taken from H&P.  Ernesta M Strom is a 37 y.o. female with medical history significant of metastatic CA of unknown primary was brought to ED with confusion and abnormal eye movements for the past 2 days. Patient was also recently started on pain medications about 10 days ago. Patient was also experiencing headaches which wakes her up at night, improves while more upright for the past 3 weeks.  Poor p.o. intake with some nausea and vomiting.  ED course.  Patient was found to be cachectic with muscle wasting, abnormal eye movements and left lower facial droop.  Labs pertinent for mild hyponatremia with sodium at 130, potassium 2.9, CBC with leukocytosis at 13.9 which seems improved than most recent check of 16.5, approximately 3 weeks ago. CTA head and neck = IMPRESSION: 1. 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 No proximal large vessel occlusion. 2. No cervical arterial occlusion or stenosis. 3. Bulky necrotic mediastinal lymphadenopathy and numerous large bilateral pulmonary metastases. 4. Partially necrotic right supraclavicular lymph node, likely metastatic. MRI ordered with concern of brain mets-pending. Neurology and palliative care was also consulted.  High risk for brain mets versus leptomeningeal disease.  2/5: Patient continued to have significant pain.  Requiring Dilaudid every 2 hours.  Going for another attempt of MRI.  Had second unsuccessful attempt yesterday as she was unable to lay still due to.  Neurology is aware. Discussed with Dr. Lorenso Courier from oncology and Dr. Dwana Curd from radiation oncology. Patient has her radiation oncology appointment at 2:30 PM today for simulation followed by start of radiation from tomorrow hopefully if she is able to lay still. Palliative care was also  consulted yesterday.  Patient is being transferred to [facility] for further management.  Had a long discussion with fianc and brother in the room regarding poor prognosis and palliative care involvement as advised by Dr. Lorenso Courier.  Assessment and Plan: * Acute encephalopathy Encephalopathy and hallucinations that are persistent despite stopping pain meds as outpt, subacute headaches, N/V, multiple cranial nerve findings on neuro exam, all in a patient with known widespread metastatic cancer of unknown primary. 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 Needs MRI brain w and w/o contrast-third attempt today as she was unable to stay still due to pain during prior attempts IV dilaudid PRN pain LP as next step if MRI non-diagnostic. See neurology consult note for further details Being transferred to [facility] to start radiation today.  Oncology is aware and will see the patient  Metastatic cancer ([place]) Of unknown primary.  Patient is very cachectic and having a lot of pain. Oncology and radiation oncology is aware. Being transferred to [facility] for further management   Subjective: Patient continued to have significant abdominal and pelvic pain.  P.o. intake remained poor.  Physical Exam: Vitals:   08/08/22 1933 08/08/22 2343 08/09/22 0354 08/09/22 0807  BP: 125/87 119/82 130/86 132/88  Pulse: (!) 117 (!) 109 (!) 109 (!) 102  Resp: '20 18 18 16  '$ Temp: (!) 100.7 F (38.2 C) 99.2 F (37.3 C) 98 F (36.7 C) 98.2 F (36.8 C)  TempSrc: Oral Oral Oral Oral  SpO2: 97% 96% 91% 98%  Weight:      Height:       General.  Cachectic lady, in no acute distress. Pulmonary.  Lungs clear bilaterally, normal respiratory effort. CV.  Regular rate and rhythm, no JVD, rub or murmur. Abdomen.  Soft, nontender, nondistended, BS positive. CNS.  Alert and oriented .   Extremities.  No edema, no cyanosis, pulses intact and symmetrical.   Data Reviewed: Prior data  reviewed  Family Communication: Discussed with fianc and brother at bedside  Disposition: Status is: Inpatient Remains inpatient appropriate because: Severity of illness.  Being transferred to [place] Discharge Destination: Home    Time spent: 40 minutes  This record has been created using Systems analyst. Errors have been sought and corrected,but may not always be located. Such creation errors do not reflect on the standard of care.   Author: Lorella Nimrod, MD 08/09/2022 10:30 AM  For on call review [email or web address].

## 2022-08-09 NOTE — Plan of Care (Signed)
MRI brain-better images than last time but still a lot of motion.  Large intraparotid sellar lesion extending into the bilateral cavernous sinuses and sphenoid sinuses and resulting in narrowing of the bilateral internal carotid arteries.  This could represent a pituitary macroadenoma versus secondary lesion given patient's history.  Eye-movement abnormalities can be explained by the cavernous sinus involvement. For further evaluation, an LP is indicated with cytology and flow cytometry. Please obtain an MRI and further management will be per oncology and medicine. Neurology will be available as needed. Please call with questions.   -- Amie Portland, MD Neurologist [facility] Pager: [phone]

## 2022-08-09 NOTE — Progress Notes (Addendum)
Rn notified patients Rn that she was returning to the floor and that she received '1mg'$  of Dilaudid @ 1526.

## 2022-08-09 NOTE — Progress Notes (Signed)
   08/09/22 1725  Assess: MEWS Score  Temp 97.8 F (36.6 C)  BP 131/86  Pulse Rate (!) 114  Resp 14  SpO2 94 %  O2 Device Room Air  Assess: MEWS Score  MEWS Temp 0  MEWS Systolic 0  MEWS Pulse 2  MEWS RR 0  MEWS LOC 0  MEWS Score 2  MEWS Score Color Yellow  Assess: if the MEWS score is Yellow or Red  Were vital signs taken at a resting state? Yes  Focused Assessment No change from prior assessment  Does the patient meet 2 or more of the SIRS criteria? No  Does the patient have a confirmed or suspected source of infection? No  MEWS guidelines implemented *See Row Information* Yes  Treat  MEWS Interventions Administered prn meds/treatments  Pain Scale 0-10  Pain Score 10  Take Vital Signs  Increase Vital Sign Frequency  Yellow: Q 2hr X 2 then Q 4hr X 2, if remains yellow, continue Q 4hrs  Escalate  MEWS: Escalate Yellow: discuss with charge nurse/RN and consider discussing with provider and RRT  Notify: Charge Nurse/RN  Name of Charge Nurse/RN Notified Block, Shanon Brow, RN  Date Charge Nurse/RN Notified 08/09/22  Time Charge Nurse/RN Notified 1730  Assess: SIRS CRITERIA  SIRS Temperature  0  SIRS Pulse 1  SIRS Respirations  0  SIRS WBC 0  SIRS Score Sum  1

## 2022-08-09 NOTE — Telephone Encounter (Signed)
Per epic, patient is currently a hospital admission w/ Oncology services, no f/u call placed.

## 2022-08-09 NOTE — Progress Notes (Signed)
[facility] picked up patient, transferred to [facility] room 3.Natalie Hinton

## 2022-08-10 ENCOUNTER — Ambulatory Visit
Admission: RE | Admit: 2022-08-10 | Discharge: 2022-08-10 | Disposition: A | Discharge status: Home / Self Care | Payer: Medicaid Other | Source: Ambulatory Visit | Attending: Radiation Oncology | Admitting: Radiation Oncology

## 2022-08-10 ENCOUNTER — Encounter: Payer: Self-pay | Admitting: Internal Medicine

## 2022-08-10 ENCOUNTER — Other Ambulatory Visit: Payer: Self-pay

## 2022-08-10 DIAGNOSIS — G893 Neoplasm related pain (acute) (chronic): Secondary | ICD-10-CM | POA: Insufficient documentation

## 2022-08-10 DIAGNOSIS — R509 Fever, unspecified: Secondary | ICD-10-CM | POA: Insufficient documentation

## 2022-08-10 DIAGNOSIS — G9340 Encephalopathy, unspecified: Secondary | ICD-10-CM | POA: Diagnosis not present

## 2022-08-10 DIAGNOSIS — R4182 Altered mental status, unspecified: Secondary | ICD-10-CM | POA: Diagnosis not present

## 2022-08-10 DIAGNOSIS — C799 Secondary malignant neoplasm of unspecified site: Secondary | ICD-10-CM | POA: Diagnosis not present

## 2022-08-10 DIAGNOSIS — Z515 Encounter for palliative care: Secondary | ICD-10-CM | POA: Diagnosis not present

## 2022-08-10 DIAGNOSIS — I6523 Occlusion and stenosis of bilateral carotid arteries: Secondary | ICD-10-CM | POA: Diagnosis not present

## 2022-08-10 DIAGNOSIS — E43 Unspecified severe protein-calorie malnutrition: Secondary | ICD-10-CM | POA: Diagnosis not present

## 2022-08-10 LAB — RAD ONC ARIA SESSION SUMMARY
Course Elapsed Days: 0
Plan Fractions Treated to Date: 1
Plan Prescribed Dose Per Fraction: 8 Gy
Plan Total Fractions Prescribed: 1
Plan Total Prescribed Dose: 8 Gy
Reference Point Dosage Given to Date: 8 Gy
Reference Point Session Dosage Given: 8 Gy
Session Number: 1

## 2022-08-10 MED ORDER — IBUPROFEN 200 MG PO TABS
200.0000 mg | ORAL_TABLET | Freq: Once | ORAL | Status: AC
  Administered 2022-08-10: 200 mg via ORAL
  Filled 2022-08-10: qty 1

## 2022-08-10 MED ORDER — NICOTINE 21 MG/24HR TD PT24
21.0000 mg | MEDICATED_PATCH | Freq: Every day | TRANSDERMAL | Status: DC
  Administered 2022-08-10 – 2022-08-16 (×6): 21 mg via TRANSDERMAL
  Filled 2022-08-10 (×7): qty 1

## 2022-08-10 NOTE — TOC Progression Note (Signed)
Transition of Care [facility]) - Progression Note    Patient Details  Name: Natalie Hinton MRN: [id number] Date of Birth: Oct 12, 1985  Transition of Care [facility]) CM/SW Contact  Angelita Ingles, RN Phone [phone]  08/10/2022, 8:29 AM  Clinical Narrative:     Transition of Care [facility]) Screening Note   Patient Details  Name: Natalie Hinton Date of Birth: March 21, 1986   Transition of Care [facility]) CM/SW Contact:    Angelita Ingles, RN Phone Number: 08/10/2022, 8:29 AM    Transition of Care [facility]) has reviewed patient and no TOC needs have been identified at this time. We will continue to monitor patient advancement through interdisciplinary progression rounds. If new patient transition needs arise, please place a TOC consult.          Expected Discharge Plan and Services                                               Social Determinants of Health (SDOH) Interventions SDOH Screenings   Depression (PHQ2-9): High Risk (07/21/2022)  Tobacco Use: High Risk (08/07/2022)    Readmission Risk Interventions     No data to display

## 2022-08-10 NOTE — Plan of Care (Signed)
Pt resting in bed at handoff, Temp 98.8 F, HR 108.  POC maintained, will continue to monitor.   Problem: Education: Goal: Knowledge of General Education information will improve Description: Including pain rating scale, medication(s)/side effects and non-pharmacologic comfort measures Outcome: Progressing   Problem: Health Behavior/Discharge Planning: Goal: Ability to manage health-related needs will improve Outcome: Progressing   Problem: Clinical Measurements: Goal: Ability to maintain clinical measurements within normal limits will improve Outcome: Progressing Goal: Will remain free from infection Outcome: Progressing Goal: Diagnostic test results will improve Outcome: Progressing Goal: Respiratory complications will improve Outcome: Progressing Goal: Cardiovascular complication will be avoided Outcome: Progressing   Problem: Activity: Goal: Risk for activity intolerance will decrease Outcome: Progressing   Problem: Nutrition: Goal: Adequate nutrition will be maintained Outcome: Progressing   Problem: Coping: Goal: Level of anxiety will decrease Outcome: Progressing   Problem: Elimination: Goal: Will not experience complications related to bowel motility Outcome: Progressing Goal: Will not experience complications related to urinary retention Outcome: Progressing   Problem: Pain Managment: Goal: General experience of comfort will improve Outcome: Progressing   Problem: Safety: Goal: Ability to remain free from injury will improve Outcome: Progressing   Problem: Skin Integrity: Goal: Risk for impaired skin integrity will decrease Outcome: Progressing

## 2022-08-10 NOTE — Progress Notes (Signed)
PROGRESS NOTE  Natalie Hinton ZSW:[id number] DOB: July 01, 1986   PCP: [name], MD  Patient is from: Home.  DOA: 08/07/2022 LOS: 2  Chief complaints Chief Complaint  Patient presents with   Altered Mental Status     Brief Narrative / Interim history: 37 year old F with recent diagnosis of widespread metastatic cancer with unknown primary presenting with altered mental status, order visual hallucination, headache and abnormal eye movements, and admitted for acute encephalopathy.  Patient diagnosed with metastatic cancer of unknown primary early January.  CT chest, abdomen and pelvis on 07/07/2022 showed widespread mets static disease with innumerable pulmonary nodules, necrotic mediastinal and right thoracic inlet adenopathy, cystic pelvic mass concerning for neoplasm and destructive bone lesion in the left superior pubic ramus thorax.  Patient was seen by oncology, GYN oncology and radiation oncology outpatient.  She had supraclavicular lymph node biopsy on 1/7.  Pathology showed poorly differentiated carcinoma.   Workup in ED with low sodium to 130, potassium to 2.9, WBC of 13.9.  CTA head and neck showed severe narrowing of both ICA at the skull base, bilateral necrotic mediastinal LAD, numerous large bilateral pulmonary metastasis and partially necrotic right supraventricular lymph node.  MRI brain without contrast markedly limited due to degree of motion artifacts.  MRI spine for met screening without evidence of leptomeningeal disease and possible small L2 compression fracture.  MRI brain W WO contrast showed large sellar lesion extending into bilateral cavernous sinuses and sphenoid sinus resulting in narrowing of bilateral ICA.  EEG suggests mild diffuse encephalopathy but no seizure or epileptiform discharge.  Neurology, oncology, and oncology and palliative medicine consulted.  Transferred to [facility] for palliative radiation.  Oncology recommended pursuing comfort based care.   Radiation oncology to start palliative radiation.  Palliative medicine following.  Patient continues to have significant pelvic pain for which she is requiring IV Dilaudid every 2 hours.  Subjective: Seen and examined earlier this morning.  Multiple family members at bedside including significant other, mother, sister, aunt and cousin.  Patient continues to endorse pelvic pain.  She is a sleepy but wakes to voice although she falls back sleep again.  She appears frail.  Does not appear to be in much distress.  Extensive discussion about plan of care including palliative radiation and involvement of palliative medicine.  Family seems to have a good understanding about poor prognosis.  Objective: Vitals:   08/10/22 0234 08/10/22 0300 08/10/22 0330 08/10/22 0720  BP: 127/81  118/79 116/73  Pulse: (!) 125  (!) 113 (!) 102  Resp: 20 (!) '21 20 18  '$ Temp: (!) 102.5 F (39.2 C)  98.8 F (37.1 C) 98.2 F (36.8 C)  TempSrc: Oral  Oral Oral  SpO2: 96%  100% 98%  Weight:      Height:        Examination:  GENERAL: Appears frail.  No apparent distress. HEENT: MMM.  Vision and hearing grossly intact.  NECK: Supple.  No apparent JVD.  RESP:  No IWOB.  Fair aeration bilaterally. CVS:  RRR. Heart sounds normal.  ABD/GI/GU: BS+. Abd soft, NTND.  MSK/EXT:  Moves extremities. No apparent deformity. No edema.  SKIN: no apparent skin lesion or wound NEURO: Sleepy but wakes to voice. Fairly oriented.  Follows commands.  RLE weakness. PSYCH: Calm. Normal affect.   Procedures:  None  Microbiology summarized: None  Assessment and plan: Principal Problem:   Acute encephalopathy Active Problems:   Metastatic cancer (HCC)   Protein-calorie malnutrition, severe  Fever   Cancer related pain   Internal carotid artery stenosis, bilateral  Acute toxic metabolic encephalopathy in the setting of brain metastasis and opiate: CT angio head and neck, MRI brain and EEG as above.  Ammonia and B12 within  normal.  B1 pending.  Urine drug screen was not sent.  She is sleepy but wakes to voice.  She has RLE weakness on exam.  Spiked fever to 102.5 overnight but no clear source of infection.  Fever could be from malignancy.  -Neurology signed off. -Follow vitamin B1 level. -Continue high-dose thiamine -Reorientation and delirium precautions.   Metastatic cancer with unknown primary: Widespread metastases including lung, mediastinal and right thoracic inlet LAD, pelvis and pelvic bone.  CA 19-9 and CEA markedly elevated.  AFP negative. -Appreciate oncology input-very deconditioned.  Recommended comfort based care. -Appreciate radiation oncology input-palliative radiation pubic mass and cranial mass. -Palliative medicine on board -Continue IV Dilaudid 1 mg every 2 hours as needed  Fever: Spiked fever to 102.5.  Likely from malignancy. -Tylenol as needed  Bilateral ICA stenosis: Secondary to metastatic mass.  Strokelike symptoms: Presents with abnormal movements.  Now with RLE weakness.  Likely due to metastatic mass. -Management as above  Goal of care: Patient with widespread metastatic disease and severe malnutrition.  She is physically deconditioned.  Very poor prognosis.  Oncology recommended comfort care.  -Palliative medicine consulted   Severe malnutrition Body mass index is 13.67 kg/m. Nutrition Problem: Severe Malnutrition Etiology: acute illness (newly dx cancer) Signs/Symptoms: energy intake < or equal to 50% for > or equal to 5 days, percent weight loss Percent weight loss: 36 % Interventions: Ensure Enlive (each supplement provides 350kcal and 20 grams of protein)   DVT prophylaxis:  SCDs Start: 08/08/22 0358  Code Status: Full code Family Communication: Updated patient's mother, significant other, sister, aunt and cousin at bedside Level of care: Med-Surg Status is: Inpatient Remains inpatient appropriate because: Encephalopathy and widespread metastatic  disease   Final disposition: TBD Consultants:  Neurology Oncology Radiation oncology Palliative medicine  55 minutes with more than 50% spent in reviewing records, counseling patient/family and coordinating care.   Sch Meds:  Scheduled Meds:  feeding supplement  237 mL Oral TID BM   folic acid  1 mg Oral Daily   multivitamin with minerals  1 tablet Oral Daily   Continuous Infusions:  lactated ringers 100 mL/hr at 08/10/22 0505   thiamine (VITAMIN B1) injection 500 mg (08/10/22 0014)   PRN Meds:.acetaminophen **OR** acetaminophen, diazepam, HYDROmorphone (DILAUDID) injection, ondansetron **OR** ondansetron (ZOFRAN) IV  Antimicrobials: Anti-infectives (From admission, onward)    None        I have personally reviewed the following labs and images: CBC: Recent Labs  Lab 08/07/22 2123 08/08/22 0445  WBC 13.9* 13.0*  HGB 11.5* 11.2*  HCT 34.3* 33.0*  MCV 85.5 84.2  PLT 499* 518*   BMP &GFR Recent Labs  Lab 08/07/22 2123 08/08/22 0445  NA 130* 133*  K 2.9* 3.9  CL 92* 95*  CO2 25 25  GLUCOSE 79 99  BUN 7 <5*  CREATININE 0.52 0.48  CALCIUM 9.4 9.3  MG 1.9 2.0  PHOS  --  3.6   Estimated Creatinine Clearance: 49.3 mL/min (by C-G formula based on SCr of 0.48 mg/dL). Liver & Pancreas: Recent Labs  Lab 08/07/22 2123  AST 37  ALT 23  ALKPHOS 65  BILITOT 1.2  PROT 7.0  ALBUMIN 2.6*   No results for input(s): "LIPASE", "AMYLASE" in the  last 168 hours. Recent Labs  Lab 08/07/22 2123  AMMONIA 26   Diabetic: No results for input(s): "HGBA1C" in the last 72 hours. Recent Labs  Lab 08/07/22 2151  GLUCAP 80   Cardiac Enzymes: No results for input(s): "CKTOTAL", "CKMB", "CKMBINDEX", "TROPONINI" in the last 168 hours. No results for input(s): "PROBNP" in the last 8760 hours. Coagulation Profile: No results for input(s): "INR", "PROTIME" in the last 168 hours. Thyroid Function Tests: No results for input(s): "TSH", "T4TOTAL", "FREET4", "T3FREE",  "THYROIDAB" in the last 72 hours. Lipid Profile: No results for input(s): "CHOL", "HDL", "LDLCALC", "TRIG", "CHOLHDL", "LDLDIRECT" in the last 72 hours. Anemia Panel: Recent Labs    08/07/22 2123  VITAMINB12 1,315*  FOLATE 10.9   Urine analysis:    Component Value Date/Time   COLORURINE AMBER BIOCHEMICALS MAY BE AFFECTED BY COLOR (A) 12/09/2007 1618   APPEARANCEUR CLEAR 12/09/2007 1618   LABSPEC 1.010 07/07/2022 1253   PHURINE 7.0 07/07/2022 [place] 07/07/2022 1253   HGBUR NEGATIVE 07/07/2022 [place] 07/07/2022 1253   KETONESUR NEGATIVE 07/07/2022 1253   PROTEINUR NEGATIVE 07/07/2022 1253   UROBILINOGEN 0.2 07/07/2022 1253   NITRITE NEGATIVE 07/07/2022 1253   LEUKOCYTESUR NEGATIVE 07/07/2022 1253   Sepsis Labs: Invalid input(s): "PROCALCITONIN", "LACTICIDVEN"  Microbiology: No results found for this or any previous visit (from the past 240 hour(s)).  Radiology Studies: MR BRAIN W WO CONTRAST  Addendum Date: 08/10/2022   ADDENDUM REPORT: 08/10/2022 08:33 ADDENDUM: Addendum performed for correction of impression typographical error. IMPRESSION: Large sellar lesion extending into the bilateral cavernous sinuses and sphenoid sinus, and resulting in narrowing of the bilateral internal carotid arteries, as described above. 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 Electronically Signed   By: Pedro Earls M.D.   On: 08/10/2022 08:33   Result Date: 08/10/2022 CLINICAL DATA:  Weight loss, and. EXAM: MRI HEAD WITHOUT AND WITH CONTRAST TECHNIQUE: Multiplanar, multiecho pulse sequences of the brain and surrounding structures were obtained without and with intravenous contrast. CONTRAST:  72m GADAVIST GADOBUTROL 1 MMOL/ML IV SOLN COMPARISON:  None Available. FINDINGS: Brain: Necrotic enhancing sellar lesion is seen extending into and expanding the cavernous sinuses, eroding the sellar  floor and extending into the right sphenoid sinus, extending into the suprasellar cistern and along the pituitary stalk and infundibular recess. Extension into the bilateral cavernous sinuses results in severe narrowing of the internal carotid arteries. Thickened pituitary stalk impinges on the optic chiasm. The mass lesion measures roughly 3.7 x 1.7 x 1.5 cm. No other focus of abnormal contrast enhancement is seen. However, postcontrast images are severely degraded by motion. No acute infarct, acute hemorrhage, hydrocephalus or extra-axial collection. Vascular: Decrease caliber of the cavernous segment of the bilateral internal carotid arteries, as described above. Other major intracranial flow voids are maintained. Skull and upper cervical spine: No focal marrow lesion identified in the calvarium the and upper cervical spine. Please see above for skull base. Sinuses/Orbits: Tumor invasion and mucosal thickening of the right sphenoid sinus. Remainder of the paranasal sinuses and orbits are maintained. Other: None. IMPRESSION: Large intraparotid sellar lesion extending into the bilateral cavernous sinuses and sphenoid sinus, and resulting in narrowing of the bilateral internal carotid arteries, as described above. 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 Electronically Signed: By: KPedro EarlsM.D. On: 08/09/2022 12:22   MR TOTAL SPINE METS SCREENING  Result Date: 08/09/2022  CLINICAL DATA:  Leptomeningeal disease. EXAM: MRI TOTAL SPINE WITHOUT AND WITH CONTRAST TECHNIQUE: Multisequence MR imaging of the spine from the cervical spine to the sacrum was performed prior to and following IV contrast administration for evaluation of spinal metastatic disease. CONTRAST:  38m GADAVIST GADOBUTROL 1 MMOL/ML IV SOLN COMPARISON:  None Available. FINDINGS: MRI CERVICAL SPINE FINDINGS Alignment: Physiologic. Vertebrae: No fracture, evidence of discitis,  or bone lesion. No abnormal contrast enhancement. Cord: Normal signal and morphology. No leptomeningeal enhancement. Posterior Fossa, vertebral arteries, paraspinal tissues: Negative. Enhancing mass lesion in the right supraclavicular region. Disc levels: Small posterior disc protrusion at C5 the causing small indentation of the thecal without high-grade stenosis. No significant degenerative changes elsewhere in the cervical spine. MRI THORACIC SPINE FINDINGS Alignment:  Physiologic. Vertebrae: No fracture, evidence of discitis, or bone lesion. No abnormal contrast enhancement. Cord:  Normal signal and morphology. No leptomeningeal enhancement. Paraspinal and other soft tissues: Multiple pulmonary nodules as well as right hilar and mediastinal mass. Disc levels: No significant disc bulge or herniation, spinal canal or neural foraminal stenosis at any level. MRI LUMBAR SPINE FINDINGS Segmentation:  Standard. Alignment:  Physiologic. Vertebrae: Superior endplate edema at L2 without significant height loss and faint contrast enhancement. No associated soft tissue mass. Remainder of the lumbar vertebrae are unremarkable. Conus medullaris: Extends to the L1-L2 level and appears normal. No leptomeningeal enhancement. Paraspinal and other soft tissues: Large solid and cystic lesions within the pelvis, incompletely characterized. Disc levels: No significant disc bulge or herniation, spinal canal or neural foraminal stenosis. IMPRESSION: 1. No evidence of leptomeningeal disease of the spine. 2. Superior endplate edema at L2 without significant height loss and faint contrast enhancement, favored to represent a small compression fracture. Electronically Signed   By: KPedro EarlsM.D.   On: 08/09/2022 12:44      Adonica Fukushima T. [place] If 7PM-7AM, please contact night-coverage [email or web address] 08/10/2022, 9:53 AM

## 2022-08-10 NOTE — Progress Notes (Signed)
Palliative Medicine Progress Note   Patient Name: Natalie Hinton       Date: 08/10/2022 DOB: 06/25/1986  Age: 37 y.o. MRN#: [id number] Attending Physician: Mercy Riding, MD Primary Care Physician: [name], MD Admit Date: 08/07/2022   HPI/Patient Profile: 37 y.o. 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  She presented to [facility] ED on 08/07/2022 with altered mental status and abnormal eye movements.   CTA head showed bulky necrotic mediastinal lymphadenopathy and numerous large bilateral pulmonary metastases as well as necrotic right supraclavicular lymph node. Admitted to [facility] service for further evaluation of acute metabolic encephalopathy.  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   Palliative Medicine was consulted for goals of care in the setting of metastatic cancer.  Subjective: Chart reviewed. Note oncology (Dr. Lorenso Courier) is recommending comfort based care given "patient's deconditioned state, severe metastatic disease of unclear etiology, and large brain metastasis".   I spoke with patient's sister Jenny Reichmann and her cousin [name] by phone. They understand that patient has metastatic cancer, now with large brain metastasis.  Family has questions about risk versus benefits of radiation treatment. They would like to meet as a group tomorrow to discuss plan of care.   Visited patient at bedside. She has just returned from first radiation treatment. She reports some pelvic pain. Significant other is at bedside. Discussed plan for family meeting tomorrow.   Plan of care discussed with radiation oncologist Dr. Sondra Come.   Objective:  Physical Exam Vitals reviewed.  Constitutional:       General: She is awake. She is not in acute distress.    Appearance: She is cachectic. She is ill-appearing.  Pulmonary:     Effort: Pulmonary effort is normal.  Neurological:     Mental Status: She is lethargic.             Vital Signs: BP 120/83 (BP Location: Right Arm)   Pulse (!) 107   Temp 98.1 F (36.7 C) (Oral)   Resp 18   Ht 5' (1.524 m)   Wt 31.8 kg   SpO2 95%   BMI 13.67 kg/m  SpO2: SpO2: 95 % O2 Device: O2 Device: Room Air O2 Flow Rate:      Palliative Medicine Assessment & Plan  Assessment: Principal Problem:   Acute encephalopathy Active Problems:   Metastatic cancer (HCC)   Protein-calorie malnutrition, severe   Fever   Cancer related pain   Internal carotid artery stenosis, bilateral    Recommendations/Plan: Continue current interventions Family meeting tomorrow 2/6 - time TBD  Code Status: Full   Thank you for allowing the Palliative Medicine Team to assist in the care of this patient.    Lavena Bullion, NP   Please contact Palliative Medicine Team phone at [phone] for questions and concerns.  For individual providers, please see AMION.

## 2022-08-10 NOTE — Progress Notes (Signed)
   08/10/22 0130  Vitals  Temp (!) 100.6 F (38.1 C)  Temp Source Oral  BP 139/82  MAP (mmHg) 98  BP Location Left Arm  BP Method Automatic  Patient Position (if appropriate) Lying  Pulse Rate (!) 132  Pulse Rate Source Dinamap  Resp 20  MEWS COLOR  MEWS Score Color Red  Oxygen Therapy  SpO2 92 %  O2 Device Room Air  MEWS Score  MEWS Temp 1  MEWS Systolic 0  MEWS Pulse 3  MEWS RR 0  MEWS LOC 0  MEWS Score 4  Provider Notification  Provider Name/Title Gershon Cull NP  Date Provider Notified 08/10/22  Time Provider Notified 0155  Method of Notification Page  Notification Reason Other (Comment) (red MEWS)  Provider response At bedside  Date of Provider Response 08/10/22  Time of Provider Response 0202  Rapid Response Notification  Name of Rapid Response RN Notified Erin RN  Date Rapid Response Notified 08/10/22  Time Rapid Response Notified 0205   Pt triggering red mews due to temp, HR. On call provider and rapid response RN on floor for issue with another pt and they were notified about this pts mews status and issues. Awaiting new orders if any. Pt medicated for pain with prn dilaudid as available. Continue to monitor. Hortencia Conradi RN

## 2022-08-11 ENCOUNTER — Ambulatory Visit: Payer: Medicaid Other | Admitting: Radiation Oncology

## 2022-08-11 ENCOUNTER — Other Ambulatory Visit: Payer: Self-pay

## 2022-08-11 ENCOUNTER — Ambulatory Visit
Admission: RE | Admit: 2022-08-11 | Discharge: 2022-08-11 | Disposition: A | Discharge status: Home / Self Care | Payer: Medicaid Other | Source: Ambulatory Visit | Attending: Radiation Oncology | Admitting: Radiation Oncology

## 2022-08-11 DIAGNOSIS — C799 Secondary malignant neoplasm of unspecified site: Secondary | ICD-10-CM | POA: Diagnosis not present

## 2022-08-11 DIAGNOSIS — G893 Neoplasm related pain (acute) (chronic): Secondary | ICD-10-CM | POA: Diagnosis not present

## 2022-08-11 DIAGNOSIS — G9340 Encephalopathy, unspecified: Secondary | ICD-10-CM | POA: Diagnosis not present

## 2022-08-11 DIAGNOSIS — E43 Unspecified severe protein-calorie malnutrition: Secondary | ICD-10-CM | POA: Diagnosis not present

## 2022-08-11 LAB — RAD ONC ARIA SESSION SUMMARY
Course Elapsed Days: 1
Plan Fractions Treated to Date: 1
Plan Prescribed Dose Per Fraction: 3 Gy
Plan Total Fractions Prescribed: 10
Plan Total Prescribed Dose: 30 Gy
Reference Point Dosage Given to Date: 3 Gy
Reference Point Session Dosage Given: 3 Gy
Session Number: 2

## 2022-08-11 MED ORDER — BISACODYL 10 MG RE SUPP: 10.0000 mg | Freq: Once | RECTAL | Status: DC

## 2022-08-11 MED ORDER — THIAMINE MONONITRATE 100 MG PO TABS
100.0000 mg | ORAL_TABLET | Freq: Every day | ORAL | Status: DC
  Administered 2022-08-12 – 2022-08-16 (×5): 100 mg via ORAL
  Filled 2022-08-11 (×5): qty 1

## 2022-08-11 MED ORDER — BISACODYL 5 MG PO TBEC
5.0000 mg | DELAYED_RELEASE_TABLET | Freq: Every day | ORAL | Status: DC | PRN
  Filled 2022-08-11: qty 1

## 2022-08-11 MED ORDER — DEXAMETHASONE 4 MG PO TABS
4.0000 mg | ORAL_TABLET | Freq: Every day | ORAL | Status: DC
  Administered 2022-08-11 – 2022-08-16 (×6): 4 mg via ORAL
  Filled 2022-08-11 (×6): qty 1

## 2022-08-11 MED ORDER — SENNOSIDES-DOCUSATE SODIUM 8.6-50 MG PO TABS
1.0000 | ORAL_TABLET | Freq: Two times a day (BID) | ORAL | Status: DC
  Administered 2022-08-11 – 2022-08-16 (×8): 1 via ORAL
  Filled 2022-08-11 (×9): qty 1

## 2022-08-11 MED ORDER — OXYCODONE HCL ER 15 MG PO T12A
15.0000 mg | EXTENDED_RELEASE_TABLET | Freq: Two times a day (BID) | ORAL | Status: DC
  Administered 2022-08-11 – 2022-08-13 (×5): 15 mg via ORAL
  Filled 2022-08-11 (×5): qty 1

## 2022-08-11 MED ORDER — POLYETHYLENE GLYCOL 3350 17 G PO PACK
17.0000 g | PACK | Freq: Every day | ORAL | Status: DC
  Administered 2022-08-12: 17 g via ORAL
  Filled 2022-08-11 (×2): qty 1

## 2022-08-11 MED ORDER — ONDANSETRON HCL 4 MG/2ML IJ SOLN
4.0000 mg | Freq: Three times a day (TID) | INTRAMUSCULAR | Status: AC
  Administered 2022-08-11 – 2022-08-13 (×6): 4 mg via INTRAVENOUS
  Filled 2022-08-11 (×6): qty 2

## 2022-08-11 MED ORDER — HYDROMORPHONE HCL 4 MG PO TABS
4.0000 mg | ORAL_TABLET | ORAL | Status: DC
  Administered 2022-08-11 – 2022-08-13 (×12): 4 mg via ORAL
  Filled 2022-08-11 (×12): qty 1

## 2022-08-11 NOTE — Progress Notes (Signed)
Unchanged assessment from previous. Pt c/o pain for which medications have been given. No acute distress noted. Will continue to monitor.    08/11/22 1524  Assess: MEWS Score  Temp 99.2 F (37.3 C)  BP (!) 138/97  MAP (mmHg) 111  Pulse Rate (!) 129  Resp 20  Level of Consciousness Alert  SpO2 97 %  O2 Device Room Air  Assess: MEWS Score  MEWS Temp 0  MEWS Systolic 0  MEWS Pulse 2  MEWS RR 0  MEWS LOC 0  MEWS Score 2  MEWS Score Color Yellow  Assess: if the MEWS score is Yellow or Red  Were vital signs taken at a resting state? Yes  Focused Assessment No change from prior assessment  Does the patient meet 2 or more of the SIRS criteria? No  Does the patient have a confirmed or suspected source of infection? No  Provider and Rapid Response Notified? No  MEWS guidelines implemented  No, previously yellow, continue vital signs every 4 hours  Treat  Pain Score 7  MEWS Interventions Administered prn meds/treatments  Take Vital Signs  Increase Vital Sign Frequency  Yellow: Q 2hr X 2 then Q 4hr X 2, if remains yellow, continue Q 4hrs  Escalate  MEWS: Escalate Yellow: discuss with charge nurse/RN and consider discussing with provider and RRT  Notify: Charge Nurse/RN  Name of Charge Nurse/RN Notified Kim RN  Date Charge Nurse/RN Notified 08/11/22  Time Charge Nurse/RN Notified 1603  Document  Patient Outcome Stabilized after interventions  Assess: SIRS CRITERIA  SIRS Temperature  0  SIRS Pulse 1  SIRS Respirations  0  SIRS WBC 0  SIRS Score Sum  1  Pain Assessment  Pain Intervention(s) Medication (See eMAR)  Neurological  Neuro symptoms relieved by Rest  Pain  Pain Scale 0-10   MEWS Progress Note  Patient Details Name: Keyanni SHAKEEMA LIPPMAN MRN: [id number] DOB: Jul 14, 1985 Today's Date: 08/11/2022   MEWS Flowsheet Documentation:  Assess: MEWS Score Temp: 99.2 F (37.3 C) BP: (!) 138/97 MAP (mmHg): 111 Pulse Rate: (!) 129 ECG Heart Rate: (!) 121 Resp: 20 Level of  Consciousness: Alert SpO2: 97 % O2 Device: Room Air Patient Activity (if Appropriate): In bed Assess: MEWS Score MEWS Temp: 0 MEWS Systolic: 0 MEWS Pulse: 2 MEWS RR: 0 MEWS LOC: 0 MEWS Score: 2 MEWS Score Color: Yellow Assess: SIRS CRITERIA SIRS Temperature : 0 SIRS Respirations : 0 SIRS Pulse: 1 SIRS WBC: 0 SIRS Score Sum : 1 SIRS Temperature : 0 SIRS Pulse: 1 SIRS Respirations : 0 SIRS WBC: 0 SIRS Score Sum : 1 Assess: if the MEWS score is Yellow or Red Were vital signs taken at a resting state?: Yes Focused Assessment: No change from prior assessment Does the patient meet 2 or more of the SIRS criteria?: No Does the patient have a confirmed or suspected source of infection?: No Provider and Rapid Response Notified?: No MEWS guidelines implemented : No, previously yellow, continue vital signs every 4 hours Notify: Charge Nurse/RN Name of Charge Nurse/RN Notified: Teacher, music Name/Title: Clarene Essex NP Date Provider Notified: 08/11/22 Time Provider Notified: 0045 Method of Notification: Page Notification Reason: Other (Comment) (back in Red MEWS) Provider response: Other (Comment) (cool off room/ice packs (already gave tylenol)) Date of Provider Response: 08/11/22 Time of Provider Response: 0045 Notify: Rapid Response Name of Rapid Response RN Notified: Mandy RN Date Rapid Response Notified: 08/11/22 Time Rapid Response Notified: [place]  Gerardo Caiazzo 08/11/2022, 4:06 PM

## 2022-08-11 NOTE — Progress Notes (Signed)
Pt's elevated HR triggered yellow MEWS protocol. ST r/t pt's pain, prn medications given. Will continue to monitor.    08/11/22 0758  Assess: MEWS Score  Temp 98.3 F (36.8 C)  BP (!) 153/112  MAP (mmHg) 122  Pulse Rate (!) 120  Resp 20  Level of Consciousness Alert  SpO2 100 %  O2 Device Room Air  Assess: MEWS Score  MEWS Temp 0  MEWS Systolic 0  MEWS Pulse 2  MEWS RR 0  MEWS LOC 0  MEWS Score 2  MEWS Score Color Yellow  Assess: if the MEWS score is Yellow or Red  Were vital signs taken at a resting state? Yes  Focused Assessment No change from prior assessment  Does the patient meet 2 or more of the SIRS criteria? No  Does the patient have a confirmed or suspected source of infection? No  Provider and Rapid Response Notified? No  MEWS guidelines implemented  No, previously red, continue vital signs every 4 hours  Treat  Pain Score 8  MEWS Interventions Administered prn meds/treatments  Take Vital Signs  Increase Vital Sign Frequency  Red: Q 1hr X 4 then Q 4hr X 4, if remains red, continue Q 4hrs  Escalate  MEWS: Escalate Yellow: discuss with charge nurse/RN and consider discussing with provider and RRT  Notify: Charge Nurse/RN  Name of Charge Nurse/RN Notified Kim RN  Date Charge Nurse/RN Notified 08/11/22  Time Charge Nurse/RN Notified 0809  Document  Patient Outcome Stabilized after interventions  Assess: SIRS CRITERIA  SIRS Temperature  0  SIRS Pulse 1  SIRS Respirations  0  SIRS WBC 0  SIRS Score Sum  1  Pain Assessment  Pain Intervention(s) Medication (See eMAR)  Neurological  Neuro symptoms relieved by Rest  Pain Screening  Patients Stated Pain Goal 2  Pain Descriptors / Indicators Aching  Pain Onset On-going  Pain Frequency Constant  Pain Type Acute pain  Pain  Pain Scale 0-10  Pain  Pain Location Generalized  Pain Orientation Right;Left   MEWS Progress Note  Patient Details Name: Natalie Hinton MRN: [id number] DOB: Dec 21, 1985 Today's Date:  08/11/2022   MEWS Flowsheet Documentation:  Assess: MEWS Score Temp: 98.3 F (36.8 C) BP: (!) 153/112 MAP (mmHg): 122 Pulse Rate: (!) 120 ECG Heart Rate: (!) 109 Resp: 20 Level of Consciousness: Alert SpO2: 100 % O2 Device: Room Air Assess: MEWS Score MEWS Temp: 0 MEWS Systolic: 0 MEWS Pulse: 2 MEWS RR: 0 MEWS LOC: 0 MEWS Score: 2 MEWS Score Color: Yellow Assess: SIRS CRITERIA SIRS Temperature : 0 SIRS Respirations : 0 SIRS Pulse: 1 SIRS WBC: 0 SIRS Score Sum : 1 Assess: if the MEWS score is Yellow or Red Were vital signs taken at a resting state?: (P) Yes Focused Assessment: (P) No change from prior assessment Does the patient meet 2 or more of the SIRS criteria?: (P) No Does the patient have a confirmed or suspected source of infection?: (P) No Provider and Rapid Response Notified?: (P) No MEWS guidelines implemented : (P) No, previously red, continue vital signs every 4 hours Notify: Charge Nurse/RN Name of Charge Nurse/RN Notified: Mamie Nick) Kim RN Provider Notification Provider Name/Title: Clarene Essex NP Date Provider Notified: 08/11/22 Time Provider Notified: 0045 Method of Notification: Page Notification Reason: Other (Comment) (back in Red MEWS) Provider response: Other (Comment) (cool off room/ice packs (already gave tylenol)) Date of Provider Response: 08/11/22 Time of Provider Response: 0045 Notify: Rapid Response Name of Rapid Response RN Notified: Leafy Ro RN  Date Rapid Response Notified: 08/11/22 Time Rapid Response Notified: [id number]      Rueben Bash 08/11/2022, 8:10 AM

## 2022-08-11 NOTE — Progress Notes (Signed)
Palliative Medicine Progress Note   Patient Name: Natalie Hinton       Date: 08/11/2022 DOB: 09/14/1985  Age: 37 y.o. MRN#: [id number] Attending Physician: Shelly Coss, MD Primary Care Physician: [name], MD Admit Date: 08/07/2022   HPI/Patient Profile: 37 y.o. 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  She presented to [facility] ED on 08/07/2022 with altered mental status and abnormal eye movements.   CTA head showed bulky necrotic mediastinal lymphadenopathy and numerous large bilateral pulmonary metastases as well as necrotic right supraclavicular lymph node. Admitted to [facility] service for further evaluation of acute metabolic encephalopathy.  Subsequent imaging ultimately revealed brain metastasis. She was transferred to [facility] on 2/5 to start radiation.    Palliative Medicine was consulted for goals of care in the setting of metastatic cancer.  Subjective: Patient has just returned to room from radiation treatment #2. She is complaining of 9/10 pain as well as nausea/vomiting. RN has administered prn zofran and dilaudid.  Per MAR review, she has been receiving dilaudid 1 mg IV every 2 hours (total of 12 mg in the past 24 hours).    Goals of Care Meeting: I met at bedside today with Natalie Hinton and her family. Natalie Hinton is awake and able to participate in [place] discussion today. Present are her mother/Pam, sister/Cindy, cousin/Brittany, and her aunt. Her fiance Ardyth Gal ("Peewee") declined to be part of the meeting.   I asked Natalie Hinton what she understood about her current medical situation.  She states "I have cancer all over my body". We reviewed Natalie Hinton's hospital course so far - that she presented with confusion and  vision changes and was ultimately found to have a brain lesion. We discussed that cancer that has spread to multiple sites in the body is not a curable condition and that the intent of treatment is palliative.  I reviewed with Natalie Hinton and her family the treatment plan as outlined by radiation oncologist Dr. Sondra Come: She completed 1 large fraction of radiation therapy to her left pubic bone mass yesterday 2/6. He hopes this will help with her pain in the next few days. Today 2/7 she started treatments to the large mass at the base of the skull. Anticipate 10 treatments; this treatment may possibly reverse some of her cranial  nerve deficits and possibly help with confusion issues.   I also reviewed with Natalie Hinton and her family the recommendations from medical oncologist Dr. Lorenso Courier, who spoke with Natalie Hinton and her fiance on 2/5. Unfortunately, Natalie Hinton does not recall any of this conversation.  Reviewed that his recommendation is for comfort care due to her deconditioned state and severe metastatic disease.   The difference between full scope medical intervention and comfort care was considered. For now, patient and family would like to proceed with all available and offered interventions to prolong life. They understand that they can transition to comfort-based care at any time. I offered to discuss hospice philosophy/benefits, but Natalie Hinton declined this discussion.   Lastly, we did discuss code status. Encouraged consideration of DNR/DNI status understanding evidenced based poor outcomes in similar hospitalized patients, as the cause of the arrest is likely associated with terminal disease rather than a reversible acute cardio-pulmonary event. I explained that DNR/DNI is a protective measure to keep [place] from harming someone in their last moments of life. Natalie Hinton states she will "think about it", but wishes to remain full code for now.     Objective:  Physical Exam Vitals reviewed.  Constitutional:      General: She  is not in acute distress.    Appearance: She is cachectic. She is ill-appearing.  Pulmonary:     Effort: Pulmonary effort is normal.  Neurological:     Mental Status: She is alert.             Vital Signs: BP 129/89 (BP Location: Left Arm)   Pulse (!) 116   Temp 99.2 F (37.3 C) (Oral)   Resp 18   Ht 5' (1.524 m)   Wt 31.8 kg   SpO2 96%   BMI 13.67 kg/m  SpO2: SpO2: 96 % O2 Device: O2 Device: Room Air O2 Flow Rate:     Palliative Medicine Assessment & Plan   Assessment: 37 year old female with widely metastatic cancer of unknown primary, admitted with AMS. Found to have brain metastasis.  - poor prognosis overall - uncontrolled pain  - Complicated psychosocial situation - Angelina's family has expressed concern about her fiance's ability to care for her at home.   Recommendations/Plan: Continue full scope interventions Radiation therapy as scheduled PMT will continue to follow  Symptom Management: OxyContin 15 mg twice daily Dilaudid 4 mg tablet every 4 hours Continue Dilaudid 1 mg IV every 2 hours as needed for breakthrough pain Scheduled ondansetron 4 mg IV every 8 hours x 6 doses Dexamethasone 4 mg daily Bowel regimen as ordered  Primary decision maker: Patient currently seems to have some insight into her condition and can participate in [place] law, her health care proxy would be her mother followed by her sister   Code Status: Full   Thank you for allowing the Palliative Medicine Team to assist in the care of this patient.  Total time: 85 minutes   Lavena Bullion, NP   Please contact Palliative Medicine Team phone at [phone] for questions and concerns.  For individual providers, please see AMION.

## 2022-08-11 NOTE — Progress Notes (Signed)
   08/11/22 0030  Assess: MEWS Score  Temp (!) 100.7 F (38.2 C)  BP (!) 143/92  Pulse Rate (!) 130  Resp 16  Level of Consciousness Alert  SpO2 99 %  O2 Device Room Air  Assess: MEWS Score  MEWS Temp 1  MEWS Systolic 0  MEWS Pulse 3  MEWS RR 0  MEWS LOC 0  MEWS Score 4  MEWS Score Color Red  Assess: if the MEWS score is Yellow or Red  Were vital signs taken at a resting state? Yes  Focused Assessment No change from prior assessment  Does the patient meet 2 or more of the SIRS criteria? No  Does the patient have a confirmed or suspected source of infection? No  MEWS guidelines implemented *See Row Information* Yes  Treat  MEWS Interventions Administered prn meds/treatments;Escalated (See documentation below)  Take Vital Signs  Increase Vital Sign Frequency  Red: Q 1hr X 4 then Q 4hr X 4, if remains red, continue Q 4hrs  Escalate  MEWS: Escalate Red: discuss with charge nurse/RN and provider, consider discussing with RRT  Notify: Charge Nurse/RN  Name of Charge Nurse/RN Notified Hildred Alamin RN  Date Charge Nurse/RN Notified 08/11/22  Time Charge Nurse/RN Notified [id number]  Provider Notification  Provider Name/Title Clarene Essex NP  Date Provider Notified 08/11/22  Time Provider Notified 0045  Method of Notification Page  Notification Reason Other (Comment) (back in Red MEWS)  Provider response Other (Comment) (cool off room/ice packs (already gave tylenol))  Date of Provider Response 08/11/22  Time of Provider Response 0045  Notify: Rapid Response  Name of Rapid Response RN Notified Mandy RN  Date Rapid Response Notified 08/11/22  Time Rapid Response Notified [id number]  Document  Patient Outcome Stabilized after interventions  Assess: SIRS CRITERIA  SIRS Temperature  0  SIRS Pulse 1  SIRS Respirations  0  SIRS WBC 0  SIRS Score Sum  1   Previously Red MEWS on and off.  MD and RR RN and Charge RN aware of patient's status.  Prn meds given, ice packs applied, and room cooled off  per their suggestions.  Will continue to monitor.

## 2022-08-11 NOTE — Progress Notes (Signed)
Unchanged assessment. Pt's HR elevated. C/o pain. PRN medications given. Will continue to monitor.   08/11/22 1208  Assess: MEWS Score  Temp 99.2 F (37.3 C)  BP 129/89  MAP (mmHg) 97  Pulse Rate (!) 116  Resp 18  Level of Consciousness Alert  SpO2 96 %  O2 Device Room Air  Assess: MEWS Score  MEWS Temp 0  MEWS Systolic 0  MEWS Pulse 2  MEWS RR 0  MEWS LOC 0  MEWS Score 2  MEWS Score Color Yellow  Assess: if the MEWS score is Yellow or Red  Were vital signs taken at a resting state? Yes  Focused Assessment No change from prior assessment  Does the patient meet 2 or more of the SIRS criteria? No  Does the patient have a confirmed or suspected source of infection? No  Provider and Rapid Response Notified? No  MEWS guidelines implemented  No, previously yellow, continue vital signs every 4 hours  Treat  Pain Score 9  MEWS Interventions Administered prn meds/treatments  Take Vital Signs  Increase Vital Sign Frequency  Yellow: Q 2hr X 2 then Q 4hr X 2, if remains yellow, continue Q 4hrs  Escalate  MEWS: Escalate Yellow: discuss with charge nurse/Hinton and consider discussing with provider and RRT  Notify: Charge Nurse/Hinton  Name of Charge Nurse/Hinton Notified Natalie Hinton  Date Charge Nurse/Hinton Notified 08/11/22  Time Charge Nurse/Hinton Notified 1215  Document  Patient Outcome Stabilized after interventions  Assess: SIRS CRITERIA  SIRS Temperature  0  SIRS Pulse 1  SIRS Respirations  0  SIRS WBC 0  SIRS Score Sum  1  Pain Assessment  Pain Intervention(s) Medication (See eMAR)  Neurological  Neuro symptoms relieved by Rest  Pain  Pain Scale 0-10  Pain  Pain Location Generalized  Pain Orientation Right;Left   MEWS Progress Note  Patient Details Name: Natalie Hinton MRN: [id number] DOB: 01/19/1986 Today's Date: 08/11/2022   MEWS Flowsheet Documentation:  Assess: MEWS Score Temp: 99.2 F (37.3 C) BP: (!) 138/97 MAP (mmHg): 111 Pulse Rate: (!) 129 ECG Heart Rate: (!)  121 Resp: 20 Level of Consciousness: Alert SpO2: 97 % O2 Device: Room Air Patient Activity (if Appropriate): In bed Assess: MEWS Score MEWS Temp: 0 MEWS Systolic: 0 MEWS Pulse: 2 MEWS RR: 0 MEWS LOC: 0 MEWS Score: 2 MEWS Score Color: Yellow Assess: SIRS CRITERIA SIRS Temperature : 0 SIRS Respirations : 0 SIRS Pulse: 1 SIRS WBC: 0 SIRS Score Sum : 1 SIRS Temperature : 0 SIRS Pulse: 1 SIRS Respirations : 0 SIRS WBC: 0 SIRS Score Sum : 1 Assess: if the MEWS score is Yellow or Red Were vital signs taken at a resting state?: Yes Focused Assessment: No change from prior assessment Does the patient meet 2 or more of the SIRS criteria?: No Does the patient have a confirmed or suspected source of infection?: No Provider and Rapid Response Notified?: No MEWS guidelines implemented : No, previously yellow, continue vital signs every 4 hours Notify: Charge Nurse/Hinton Name of Charge Nurse/Hinton Notified: Natalie Hinton Name/Title: Natalie Essex NP Date Provider Notified: 08/11/22 Time Provider Notified: 0045 Method of Notification: Page Notification Reason: Other (Comment) (back in Red MEWS) Provider response: Other (Comment) (cool off room/ice packs (already gave tylenol)) Date of Provider Response: 08/11/22 Time of Provider Response: 0045 Notify: Rapid Response Name of Rapid Response Hinton Notified: Natalie Hinton Date Rapid Response Notified: 08/11/22 Time Rapid Response Notified: 0347      Natalie Hinton  E Natalie Hinton 08/11/2022, 4:05 PM

## 2022-08-11 NOTE — Progress Notes (Signed)
PROGRESS NOTE  Natalie Hinton  XBM:[id number] DOB: 20-Mar-1986 DOA: 08/07/2022 PCP: [name], MD   Brief Narrative: Patient is a 37 year old female with history of widespread metastatic cancer of unknown primary presented with altered mental status, visual hallucination, headache, abnormal eye movement.  On presentation, she was encephalopathic and was admitted for further evaluation.CTA head and neck showed severe narrowing of both ICA at the skull base, bilateral necrotic mediastinal LAD, numerous large bilateral pulmonary metastasis and partially necrotic right supraventricular lymph node.   MRI brain W WO contrast showed large sellar lesion extending into bilateral cavernous sinuses and sphenoid sinus resulting in narrowing of bilateral ICA. Neurology, oncology, and oncology and palliative medicine consulted.  Transferred to [facility] for palliative radiation.  Oncology recommended comfort care/hospice.  Radiation oncology recommending radiation treatment to the base of skull.  Palliative care consulted and following for goals of care.  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  Brain imagings as above.  Ammonia, vitamin B12 level normal.  Has right lower extremity weakness.left facial droop.  Neurology signed off.   Continue reorientation, delirium precaution.EEG showed mild diffuse encephalopathy but no seizure or epileptiform discharge.  She remains alert and oriented now  Metastatic cancer with unknown primary:  She had supraclavicular lymph node biopsy on 1/7.  Pathology showed poorly differentiated carcinoma. Widespread mets including lung, mediastinum, pelvis.  CA 19-19, CEA marked elevated.  Oncology was following and recommended comfort care/hospice.  Radiation oncology also following.  Radiation oncology recommending starting  radiation treatment to base of skull  Fever: Spikes fever intermittently: Most likely from malignancy.  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  Patient also had a strokelike symptoms with abnormal movement, right lower extremity weakness all from metastatic effect  Severe protein calorie malnutrition: BMI of 13.6  Constipation: Continue bowel regimen.  No bowel movement for last several days  Goals of care: Very poor prognosis on this young patient with widespread metastatic disease.  Patient is also severely malnourished.  Very deconditioned.  Oncology recommended comfort care.  Palliative care discussing with family about goals of care  Debility/deconditioning: Will consult PT/OT        Nutrition Problem: Severe Malnutrition Etiology: acute illness (newly dx cancer)    DVT prophylaxis:SCDs Start: 08/08/22 0358     Code Status: Full Code  Family Communication: Fiance at bedside  Patient status:Inpatient  Patient is from :Home  Anticipated discharge to: Home  Estimated DC date:1-2 days, depending upon PT assessment/palliative care conversation   Consultants: Palliative care, oncology, neurology  Procedures: None during this hospitalization  Antimicrobials:  Anti-infectives (From admission, onward)    None       Subjective: Patient seen and examined at bedside today.  Overall she looks comfortable.  Complains of pain in the pelvic area.  Alert and oriented.  No bowel movement for last several days.  No nausea or vomiting.  On room air. Fiance at bedside   Objective: Vitals:   08/11/22 0133 08/11/22 0247 08/11/22 0335 08/11/22 0758  BP: 134/80 (!) 141/73 137/80 (!) 153/112  Pulse: (!) 130 (!) 111 (!) 109 (!) 120  Resp: '18 16 16 20  '$ Temp: (!) 100.4 F (38 C) 99.1 F (37.3 C) 98.9 F (37.2 C) 98.3 F (36.8 C)  TempSrc: Oral Oral Oral Oral  SpO2: 98% 99% 98% 100%  Weight:      Height:        Intake/Output  Summary (Last 24 hours) at 08/11/2022 0914 Last data filed at 08/11/2022 1062 Gross per 24 hour  Intake 3387.81 ml  Output --  Net 3387.81 ml   Filed Weights   08/07/22 1941  Weight: 31.8 kg    Examination:  General exam: Overall comfortable, not in distress, very malnourished, deconditioned, thin built HEENT: PERRL, left facial droop Respiratory system:  no wheezes or crackles  Cardiovascular system: S1 & S2 heard, RRR.  Gastrointestinal system: Abdomen is distended, soft and nontender. Central nervous system: Alert and oriented, right lower extremity weakness Extremities: No edema, no clubbing ,no cyanosis Skin: No rashes, no ulcers,no icterus     Data Reviewed: I have personally reviewed following labs and imaging studies  CBC: Recent Labs  Lab 08/07/22 2123 08/08/22 0445  WBC 13.9* 13.0*  HGB 11.5* 11.2*  HCT 34.3* 33.0*  MCV 85.5 84.2  PLT 499* 694*   Basic Metabolic Panel: Recent Labs  Lab 08/07/22 2123 08/08/22 0445  NA 130* 133*  K 2.9* 3.9  CL 92* 95*  CO2 25 25  GLUCOSE 79 99  BUN 7 <5*  CREATININE 0.52 0.48  CALCIUM 9.4 9.3  MG 1.9 2.0  PHOS  --  3.6     No results found for this or any previous visit (from the past 240 hour(s)).   Radiology Studies: MR BRAIN W WO CONTRAST  Addendum Date: 08/10/2022   ADDENDUM REPORT: 08/10/2022 08:33 ADDENDUM: Addendum performed for correction of impression typographical error. IMPRESSION: Large sellar lesion extending into the bilateral cavernous sinuses and sphenoid sinus, and resulting in narrowing of the bilateral internal carotid arteries, as described above. 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 Electronically Signed   By: Pedro Earls M.D.   On: 08/10/2022 08:33   Result Date: 08/10/2022 CLINICAL DATA:  Weight loss, and. EXAM: MRI HEAD WITHOUT AND WITH CONTRAST TECHNIQUE: Multiplanar, multiecho pulse sequences of the brain and  surrounding structures were obtained without and with intravenous contrast. CONTRAST:  64m GADAVIST GADOBUTROL 1 MMOL/ML IV SOLN COMPARISON:  None Available. FINDINGS: Brain: Necrotic enhancing sellar lesion is seen extending into and expanding the cavernous sinuses, eroding the sellar floor and extending into the right sphenoid sinus, extending into the suprasellar cistern and along the pituitary stalk and infundibular recess. Extension into the bilateral cavernous sinuses results in severe narrowing of the internal carotid arteries. Thickened pituitary stalk impinges on the optic chiasm. The mass lesion measures roughly 3.7 x 1.7 x 1.5 cm. No other focus of abnormal contrast enhancement is seen. However, postcontrast images are severely degraded by motion. No acute infarct, acute hemorrhage, hydrocephalus or extra-axial collection. Vascular: Decrease caliber of the cavernous segment of the bilateral internal carotid arteries, as described above. Other major intracranial flow voids are maintained. Skull and upper cervical spine: No focal marrow lesion identified in the calvarium the and upper cervical spine. Please see above for skull base. Sinuses/Orbits: Tumor invasion and mucosal thickening of the right sphenoid sinus. Remainder of the paranasal sinuses and orbits are maintained. Other: None. IMPRESSION: Large intraparotid sellar lesion extending into the bilateral cavernous sinuses and sphenoid sinus, and resulting in narrowing of the bilateral internal carotid arteries, as described above. 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 Electronically Signed: By: KPedro EarlsM.D. On: 08/09/2022 12:22   MR TOTAL  SPINE METS SCREENING  Result Date: 08/09/2022 CLINICAL DATA:  Leptomeningeal disease. EXAM: MRI TOTAL SPINE WITHOUT AND WITH CONTRAST TECHNIQUE: Multisequence MR imaging of the spine from the cervical spine to the sacrum was performed  prior to and following IV contrast administration for evaluation of spinal metastatic disease. CONTRAST:  62m GADAVIST GADOBUTROL 1 MMOL/ML IV SOLN COMPARISON:  None Available. FINDINGS: MRI CERVICAL SPINE FINDINGS Alignment: Physiologic. Vertebrae: No fracture, evidence of discitis, or bone lesion. No abnormal contrast enhancement. Cord: Normal signal and morphology. No leptomeningeal enhancement. Posterior Fossa, vertebral arteries, paraspinal tissues: Negative. Enhancing mass lesion in the right supraclavicular region. Disc levels: Small posterior disc protrusion at C5 the causing small indentation of the thecal without high-grade stenosis. No significant degenerative changes elsewhere in the cervical spine. MRI THORACIC SPINE FINDINGS Alignment:  Physiologic. Vertebrae: No fracture, evidence of discitis, or bone lesion. No abnormal contrast enhancement. Cord:  Normal signal and morphology. No leptomeningeal enhancement. Paraspinal and other soft tissues: Multiple pulmonary nodules as well as right hilar and mediastinal mass. Disc levels: No significant disc bulge or herniation, spinal canal or neural foraminal stenosis at any level. MRI LUMBAR SPINE FINDINGS Segmentation:  Standard. Alignment:  Physiologic. Vertebrae: Superior endplate edema at L2 without significant height loss and faint contrast enhancement. No associated soft tissue mass. Remainder of the lumbar vertebrae are unremarkable. Conus medullaris: Extends to the L1-L2 level and appears normal. No leptomeningeal enhancement. Paraspinal and other soft tissues: Large solid and cystic lesions within the pelvis, incompletely characterized. Disc levels: No significant disc bulge or herniation, spinal canal or neural foraminal stenosis. IMPRESSION: 1. No evidence of leptomeningeal disease of the spine. 2. Superior endplate edema at L2 without significant height loss and faint contrast enhancement, favored to represent a small compression fracture.  Electronically Signed   By: KPedro EarlsM.D.   On: 08/09/2022 12:44    Scheduled Meds:  feeding supplement  237 mL Oral TID BM   folic acid  1 mg Oral Daily   multivitamin with minerals  1 tablet Oral Daily   nicotine  21 mg Transdermal Daily   Continuous Infusions:  lactated ringers 100 mL/hr at 08/11/22 0830   thiamine (VITAMIN B1) injection 100 mL/hr at 08/11/22 0811     LOS: 3 days   AShelly Coss MD [facility] P2/01/2023, 9:14 AM

## 2022-08-12 ENCOUNTER — Other Ambulatory Visit: Payer: Self-pay

## 2022-08-12 ENCOUNTER — Ambulatory Visit
Admission: RE | Admit: 2022-08-12 | Discharge: 2022-08-12 | Disposition: A | Discharge status: Home / Self Care | Payer: Medicaid Other | Source: Ambulatory Visit | Attending: Radiation Oncology | Admitting: Radiation Oncology

## 2022-08-12 DIAGNOSIS — G9340 Encephalopathy, unspecified: Secondary | ICD-10-CM | POA: Diagnosis not present

## 2022-08-12 LAB — BASIC METABOLIC PANEL
Anion gap: 17 — ABNORMAL HIGH (ref 5–15)
BUN: 9 mg/dL (ref 6–20)
CO2: 29 mmol/L (ref 22–32)
Calcium: 8.9 mg/dL (ref 8.9–10.3)
Chloride: 91 mmol/L — ABNORMAL LOW (ref 98–111)
Creatinine, Ser: 0.57 mg/dL (ref 0.44–1.00)
GFR, Estimated: >60 mL/min (ref >60)
Glucose, Bld: 155 mg/dL — ABNORMAL HIGH (ref 70–99)
Potassium: 4 mmol/L (ref 3.5–5.1)
Sodium: 137 mmol/L (ref 135–145)

## 2022-08-12 LAB — RAD ONC ARIA SESSION SUMMARY
Course Elapsed Days: 2
Plan Fractions Treated to Date: 2
Plan Prescribed Dose Per Fraction: 3 Gy
Plan Total Fractions Prescribed: 10
Plan Total Prescribed Dose: 30 Gy
Reference Point Dosage Given to Date: 6 Gy
Reference Point Session Dosage Given: 3 Gy
Session Number: 3

## 2022-08-12 LAB — CBC
HCT: 40.4 % (ref 36.0–46.0)
Hemoglobin: 13.1 g/dL (ref 12.0–15.0)
MCH: 28.2 pg (ref 26.0–34.0)
MCHC: 32.4 g/dL (ref 30.0–36.0)
MCV: 87.1 fL (ref 80.0–100.0)
Platelets: 209 10*3/uL (ref 150–400)
RBC: 4.64 MIL/uL (ref 3.87–5.11)
RDW: 15.2 % (ref 11.5–15.5)
WBC: 13.6 10*3/uL — ABNORMAL HIGH (ref 4.0–10.5)
nRBC: 0 % (ref 0.0–0.2)

## 2022-08-12 NOTE — Progress Notes (Signed)
Educated patient about soap- sud enema and that it will be administered via rectum, patient verbalized, " no,I don't want it tonight,  I want to sleep".

## 2022-08-12 NOTE — Progress Notes (Signed)
Chaplain engaged in an initial visit with Natalie Hinton, upon introductions Natalie Hinton was adamant that she didn't want to see a Chaplain.  Chaplain let her know she is there if she needs her.   Nurse is going to page when [place] mom arrives as she may need support in being there for her daughter.      08/12/22 1200  Spiritual Encounters  Type of Visit Initial  Care provided to: Patient

## 2022-08-12 NOTE — Progress Notes (Signed)
durable medical equipment   Patient is confined to one room and is unable to ambulate to the restroom.

## 2022-08-12 NOTE — TOC Initial Note (Addendum)
Transition of Care [facility]) - Initial/Assessment Note    Patient Details  Name: Natalie Hinton MRN: [id number] Date of Birth: 1985/10/25  Transition of Care [facility]) CM/SW Contact:    Angelita Ingles, RN Phone [phone]  08/12/2022, 3:30 PM  Clinical Narrative:                 [facility] consulted for Home health/ DME needs. Cm at bedside to discuss home health. Patient is agreeable to home health and has no preference for agency. Home Health referral accepted by Jenny Reichmann with [facility]. Info has been added to AVS. Patient has DME recommendation. Patient states that she needs [facility], hospital bed. And rolling walker. Patient states that she does not want the wheelchair.   28 DME has been ordered per [facility].   Barriers to Discharge: Continued Medical Work up   Patient Goals and CMS Choice            Expected Discharge Plan and Services                                              Prior Living Arrangements/Services                       Activities of Daily Living Home Assistive Devices/Equipment: None ADL Screening (condition at time of admission) Patient's cognitive ability adequate to safely complete daily activities?: No Is the patient deaf or have difficulty hearing?: No Does the patient have difficulty seeing, even when wearing glasses/contacts?: No Does the patient have difficulty concentrating, remembering, or making decisions?: Yes Patient able to express need for assistance with ADLs?: No Does the patient have difficulty dressing or bathing?: Yes Independently performs ADLs?: No Communication: Needs assistance Is this a change from baseline?: Pre-admission baseline Dressing (OT): Independent, Dependent Is this a change from baseline?: Pre-admission baseline Grooming: Independent, Dependent Is this a change from baseline?: Pre-admission baseline Feeding: Dependent Is this a change from baseline?: Pre-admission baseline Bathing:  Dependent Is this a change from baseline?: Pre-admission baseline Toileting: Needs assistance Is this a change from baseline?: Pre-admission baseline In/Out Bed: Needs assistance Is this a change from baseline?: Pre-admission baseline Walks in Home: Needs assistance Is this a change from baseline?: Pre-admission baseline Does the patient have difficulty walking or climbing stairs?: Yes Weakness of Legs: Both Weakness of Arms/Hands: Both  Permission Sought/Granted                  Emotional Assessment              Admission diagnosis:  Metastatic cancer [facility]) [C79.9] Patient Active Problem List   Diagnosis Date Noted   Fever 08/10/2022   Cancer related pain 08/10/2022   Internal carotid artery stenosis, bilateral 08/10/2022   Protein-calorie malnutrition, severe 08/09/2022   Metastatic cancer ([place]) 08/08/2022   Acute encephalopathy 08/08/2022   Genetic testing 08/04/2022   Pelvic mass 07/20/2022   PCP:  [name], MD Pharmacy:   [facility] [place], [address] [place] [address] Phone: [phone] Fax: [phone]     Social Determinants of Health (SDOH) Social History: SDOH Screenings   Depression (PHQ2-9): High Risk (07/21/2022)  Tobacco Use: High Risk (08/07/2022)   SDOH Interventions:  Readmission Risk Interventions    08/12/2022    3:03 PM  Readmission Risk Prevention Plan  Post Dischage Appt Complete  Medication Screening Complete  Transportation Screening Complete

## 2022-08-12 NOTE — Evaluation (Signed)
Occupational Therapy Evaluation Patient Details Name: Natalie Hinton MRN: [id number] DOB: [birth year] Today's Date: 08/12/2022   History of Present Illness 37yo female who presented to the ED with AMS and abnormal eye movements. 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 PMH anxiety, thyroid disease   Clinical Impression   [name] is a 37 year old woman admitted to hospital with above medical history. On evaluation she exhibits functional upper body strength, decreased strength and ROM of lower extremities, pain and poor activity tolerance. She is able to grossly perform functional mobility with walker with min guard and is min assist to mod assist for LB ADLs due to pain limiting her ability to reach her feet. Patient is wanting to work with therapy and be more aggressive with her cancer treatment. She also presents with double vision. Will recommend an eye patch for comfort. I do not think she will tolerate occlusion glasses. She has the intermittent assist of family. She reports living with her fiance who is there all the time but is hesitant to say that he can provide 24/7 care as he can "get irritated." Plan is for her to return home at discharge. She will need a BSC as she does not have a bathroom downstairs and the plan is to live downstairs due to stair negotiation being too difficult. She will probably need a hospital bed. Will tentatively recommend [facility] therapy as she wants to be more aggressive in her care. Will update POC as needed pending her medical status as her abilities and pain could change suddenly.        Recommendations for follow up therapy are one component of a multi-disciplinary discharge planning process, led by the attending physician.  Recommendations may be updated based on patient status, additional functional criteria and insurance authorization.   Follow Up  Recommendations  Home health OT     Assistance Recommended at Discharge Frequent or constant Supervision/Assistance  Patient can return home with the following A little help with walking and/or transfers;A little help with bathing/dressing/bathroom;Assistance with cooking/housework;Help with stairs or ramp for entrance    Functional Status Assessment  Patient has had a recent decline in their functional status and demonstrates the ability to make significant improvements in function in a reasonable and predictable amount of time.  Equipment Recommendations  BSC/3in1    Recommendations for Other Services       Precautions / Restrictions Precautions Precautions: Fall;Other (comment) Precaution Comments: poor vision R eye/double vision, watch HR, mobility/functional status may change rapidly given dx/prognosis Restrictions Weight Bearing Restrictions: No      Mobility Bed Mobility   Bed Mobility: Supine to Sit, Sit to Supine     Supine to sit: Supervision Sit to supine: Supervision   General bed mobility comments: HOB minimally elevated    Transfers Overall transfer level: Needs assistance Equipment used: Rolling walker (2 wheels) Transfers: Sit to/from Stand Sit to Stand: Min guard           General transfer comment: min guard for safety, cues for hand placement with transfers      Balance Overall balance assessment: Mild deficits observed, not formally tested, History of Falls                                         ADL either performed or  assessed with clinical judgement   ADL Overall ADL's : Needs assistance/impaired Eating/Feeding: Set up;Sitting   Grooming: Set up;Sitting   Upper Body Bathing: Set up;Sitting   Lower Body Bathing: Sit to/from stand;Minimal assistance   Upper Body Dressing : Set up;Sitting   Lower Body Dressing: Sit to/from stand;Moderate assistance Lower Body Dressing Details (indicate cue type and reason): for  donning footwear and getting clothing over feet - can manage clothing from near knees and up Toilet Transfer: Min guard;Rolling walker (2 wheels);Regular Toilet;Grab bars   Toileting- Clothing Manipulation and Hygiene: Sit to/from stand;Supervision/safety       Functional mobility during ADLs: Min guard;Rolling walker (2 wheels)       Vision Patient Visual Report: Diplopia Additional Comments: R eye double vision - with potential loss of peripheral field in R upper field,     Perception     Praxis      Pertinent Vitals/Pain Pain Assessment Pain Assessment: 0-10 Pain Score: 7  Pain Location: pelvis into legs Pain Descriptors / Indicators: Discomfort, Grimacing, Guarding Pain Intervention(s): Limited activity within patient's tolerance     Hand Dominance Right   Extremity/Trunk Assessment Upper Extremity Assessment Upper Extremity Assessment: Overall WFL for tasks assessed   Lower Extremity Assessment Lower Extremity Assessment: Defer to PT evaluation   Cervical / Trunk Assessment Cervical / Trunk Assessment: Normal   Communication Communication Communication: No difficulties   Cognition Arousal/Alertness: Awake/alert Behavior During Therapy: WFL for tasks assessed/performed Overall Cognitive Status: Within Functional Limits for tasks assessed                                 General Comments: able to follow directions well, interacted appropriate with PT/OT     General Comments       Exercises     Shoulder Instructions      Home Living Family/patient expects to be discharged to:: Private residence Living Arrangements: Other (Comment) (fiance who is disabled) Available Help at Discharge: Family Type of Home: Apartment Home Access: Stairs to enter Technical brewer of Steps: multiple flights of steps to enter, no elevator   Home Layout: One level     Bathroom Shower/Tub: Teacher, early years/pre: Standard     Home  Equipment: Cane - single point   Additional Comments: started feeling weak about 2 weeks ago, before this needed help putting on socks/shoes (pain in pelvis) for past 1.5 months. <1.5 months ago she was completely independent, rapid onset of pain and difficulty with mobility      Prior Functioning/Environment Prior Level of Function : History of Falls (last six months)             Mobility Comments: one fall in past 4 weeks- passed out, did not need cane before sx started. Fiance can help to physical lift and assist her. ADLs Comments: has needed help with shoes/socks for past month and a half        OT Problem List: Decreased strength;Pain      OT Treatment/Interventions: Self-care/ADL training;Therapeutic exercise;DME and/or AE instruction;Therapeutic activities;Balance training;Patient/family education    OT Goals(Current goals can be found in the care plan section) Acute Rehab OT Goals Patient Stated Goal: get better OT Goal Formulation: With patient Time For Goal Achievement: 08/26/22 Potential to Achieve Goals: Fair  OT Frequency: Min 2X/week    Co-evaluation              AM-PAC OT "  6 Clicks" Daily Activity     Outcome Measure Help from another person eating meals?: A Little Help from another person taking care of personal grooming?: A Little Help from another person toileting, which includes using toliet, bedpan, or urinal?: A Little Help from another person bathing (including washing, rinsing, drying)?: A Little Help from another person to put on and taking off regular upper body clothing?: A Little Help from another person to put on and taking off regular lower body clothing?: A Lot 6 Click Score: 17   End of Session Equipment Utilized During Treatment: Rolling walker (2 wheels) Nurse Communication: Mobility status  Activity Tolerance: Patient limited by pain Patient left: in bed;with call bell/phone within reach;with family/visitor present  OT Visit  Diagnosis: Muscle weakness (generalized) (M62.81);Pain                Time: 3295-1884 OT Time Calculation (min): 25 min Charges:  OT General Charges $OT Visit: 1 Visit OT Evaluation $OT Eval Low Complexity: 1 Low  Gustavo Lah, OTR/L [place]  Office [phone]   Lenward Chancellor 08/12/2022, 11:04 AM

## 2022-08-12 NOTE — Progress Notes (Signed)
PROGRESS NOTE  Natalie ZEMIRAH Hinton  XNA:[id number] DOB: 07-31-1985 DOA: 08/07/2022 PCP: [name], MD   Brief Narrative: Patient is a 37 year old female with history of widespread metastatic cancer of unknown primary presented with altered mental status, visual hallucination, headache, abnormal eye movement.  On presentation, she was encephalopathic and was admitted for further evaluation.CTA head and neck showed severe narrowing of both ICA at the skull base, bilateral necrotic mediastinal LAD, numerous large bilateral pulmonary metastasis and partially necrotic right supraventricular lymph node.   MRI brain W WO contrast showed large sellar lesion extending into bilateral cavernous sinuses and sphenoid sinus resulting in narrowing of bilateral ICA. Neurology, oncology, and oncology and palliative medicine consulted.  Transferred to [facility] for palliative radiation.  Oncology recommended comfort care/hospice.  Radiation oncology recommending radiation treatment to the base of skull.  Palliative care consulted  for goals of care.  Plan is to remain as full code and continue treatment if possible.  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  Brain imagings as above.  Ammonia, vitamin B12 level normal.  Has right lower extremity weakness.left facial droop.  Neurology signed off. EEG showed mild diffuse encephalopathy but no seizure or epileptiform discharge.  She remains alert and oriented now  Metastatic cancer with unknown primary:  She had supraclavicular lymph node biopsy on 1/7.  Pathology showed poorly differentiated carcinoma. Widespread mets including lung, mediastinum, pelvis.  CA 19-19, CEA marked elevated.  Oncology was following and recommended comfort care/hospice.  Radiation oncology also following.  Radiation oncology  started radiation treatment to base of skull, can  be done as an outpatient if she is discharged.  Fever: Spikes fever intermittently: Most likely from malignancy.  Continue Tylenol as needed.  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  Patient also had a strokelike symptoms with abnormal movement, right lower extremity weakness all from metastatic effect  Severe protein calorie malnutrition: BMI of 13.6  Constipation: Continue bowel regimen.  No bowel movement for last several days  Goals of care: Very poor prognosis on this young patient with widespread metastatic disease.  Patient is also severely malnourished.  Very deconditioned.  Oncology recommended comfort care.  Palliative care discussed with family about goals of care.  Patient and family wanted to continue full scope of treatment, radiation therapy.  Debility/deconditioning: PT/OT recommend home health.  Plan for discharge after adequate pain control and bowel movement     Nutrition Problem: Severe Malnutrition Etiology: acute illness (newly dx cancer)    DVT prophylaxis:SCDs Start: 08/08/22 0358     Code Status: Full Code  Family Communication: Aunt at bedside  Patient status:Inpatient  Patient is from :Home  Anticipated discharge to: Home  Estimated DC date:1-2 days, depending upon pain control/bowel movement  Consultants: Palliative care, oncology, neurology  Procedures: None during this hospitalization  Antimicrobials:  Anti-infectives (From admission, onward)    None       Subjective: Patient seen and examined at bedside today.  Hemodynamically stable.  On room air.  Lying in bed.  Alert and oriented.  She overall looks comfortable.  No bowel movement yet   Objective: Vitals:   08/11/22 2300 08/11/22 2338 08/12/22 0327 08/12/22 0812  BP:  (!) 128/94 130/87 122/86  Pulse:  (!) 108 99 (!) 108  Resp: '17 16 18 17  '$ Temp:  98.3 F (36.8 C) 97.6 F (36.4 C) 98.1  F (36.7 C)  TempSrc:  Oral Oral Oral  SpO2:  94%  96% 98%  Weight:      Height:       No intake or output data in the 24 hours ending 08/12/22 1042  Filed Weights   08/07/22 1941  Weight: 31.8 kg    Examination:  General exam: Overall comfortable, not in distress, malnourished, deconditioned, thin built HEENT: PERRL, left facial droop Respiratory system:  no wheezes or crackles  Cardiovascular system: S1 & S2 heard, RRR.  Gastrointestinal system: Abdomen is distended, soft and nontender. Central nervous system: Alert and oriented, right lower extremity weakness Extremities: No edema, no clubbing ,no cyanosis Skin: No rashes, no ulcers,no icterus      Data Reviewed: I have personally reviewed following labs and imaging studies  CBC: Recent Labs  Lab 08/07/22 2123 08/08/22 0445  WBC 13.9* 13.0*  HGB 11.5* 11.2*  HCT 34.3* 33.0*  MCV 85.5 84.2  PLT 499* 196*   Basic Metabolic Panel: Recent Labs  Lab 08/07/22 2123 08/08/22 0445  NA 130* 133*  K 2.9* 3.9  CL 92* 95*  CO2 25 25  GLUCOSE 79 99  BUN 7 <5*  CREATININE 0.52 0.48  CALCIUM 9.4 9.3  MG 1.9 2.0  PHOS  --  3.6     No results found for this or any previous visit (from the past 240 hour(s)).   Radiology Studies: No results found.  Scheduled Meds:  bisacodyl  10 mg Rectal Once   dexamethasone  4 mg Oral Daily   feeding supplement  237 mL Oral TID BM   folic acid  1 mg Oral Daily   HYDROmorphone  4 mg Oral Q4H   multivitamin with minerals  1 tablet Oral Daily   nicotine  21 mg Transdermal Daily   ondansetron (ZOFRAN) IV  4 mg Intravenous Q8H   oxyCODONE  15 mg Oral Q12H   polyethylene glycol  17 g Oral Daily   senna-docusate  1 tablet Oral BID   thiamine  100 mg Oral Daily   Continuous Infusions:  lactated ringers Stopped (08/11/22 1601)     LOS: 4 days   Shelly Coss, MD [facility] P2/02/2023, 10:42 AM

## 2022-08-12 NOTE — TOC Progression Note (Signed)
Transition of Care [facility]) - Progression Note    Patient Details  Name: Anniebell EFRATA BRUNNER MRN: [id number] Date of Birth: 12-29-1985  Transition of Care [facility]) CM/SW [place], RN Phone Number:[phone]  08/12/2022, 3:42 PM  Clinical Narrative:   durable medical equipment    Durable Medical Equipment  (From admission, onward)           Start     Ordered   08/12/22 1527  For home use only DME Hospital bed  Once       Question Answer Comment  Length of Need Lifetime   Bed type Semi-electric      08/12/22 1526   08/12/22 1526  For home use only DME Walker rolling  Once       Question Answer Comment  Walker: With [place] Wheels   Patient needs a walker to treat with the following condition Balance disorder      08/12/22 1526   08/12/22 1319  For home use only DME 3 n 1  Once        08/12/22 1319                      Barriers to Discharge: Continued Medical Work up  Expected Discharge Plan and Services                                               Social Determinants of Health (SDOH) Interventions SDOH Screenings   Depression (PHQ2-9): High Risk (07/21/2022)  Tobacco Use: High Risk (08/07/2022)    Readmission Risk Interventions    08/12/2022    3:03 PM  Readmission Risk Prevention Plan  Post Dischage Appt Complete  Medication Screening Complete  Transportation Screening Complete

## 2022-08-12 NOTE — Progress Notes (Signed)
Palliative Medicine Progress Note   Patient Name: Natalie Hinton       Date: 08/12/2022 DOB: Kamerin 15, 1987  Age: 37 y.o. MRN#: [id number] Attending Physician: Shelly Coss, MD Primary Care Physician: [name], MD Admit Date: 08/07/2022   HPI/Patient Profile: 37 y.o. 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  She presented to [facility] ED on 08/07/2022 with altered mental status and abnormal eye movements.   CTA head showed bulky necrotic mediastinal lymphadenopathy and numerous large bilateral pulmonary metastases as well as necrotic right supraclavicular lymph node. Admitted to [facility] service for further evaluation of acute metabolic encephalopathy.  Subsequent imaging ultimately revealed brain metastasis. She was transferred to [facility] on 2/5 to start radiation.    Palliative Medicine was consulted for pain management and goals of care in the setting of metastatic cancer.  Subjective: Patient resting in bed, aunt at bedside, PT OT also present in the room Medication history reviewed-8 mg of IV hydromorphone in the past 24 hours, patient was started on 15 mg of p.o. OxyContin scheduled twice daily, also on oral hydromorphone.  6 doses of 4 mg each of oral hydromorphone in the past 24 hours    Goals of Care: Wishes to go home towards the end of this hospitalization Patient wishes to continue radiation treatments and understands that that they are palliative in nature and that she has advanced cancer PT OT working closely with the patient to help determine best services going forward Patient does not wish to discuss about hospice services currently. Full code for now     Objective:  Physical Exam Vitals  reviewed.  Constitutional:      General: She is not in acute distress.    Appearance: She is cachectic. She is ill-appearing.  Pulmonary:     Effort: Pulmonary effort is normal.  Neurological:     Mental Status: She is alert.             Vital Signs: BP 122/86 (BP Location: Left Arm)   Pulse (!) 108   Temp 98.1 F (36.7 C) (Oral)   Resp 17   Ht 5' (1.524 m)   Wt 31.8 kg   SpO2 98%   BMI 13.67 kg/m  SpO2: SpO2: 98 % O2 Device: O2 Device:  Room Air O2 Flow Rate:     Palliative Medicine Assessment & Plan   Assessment: 37 year old female with widely metastatic cancer of unknown primary, admitted with AMS. Found to have brain metastasis.  - poor prognosis overall - uncontrolled pain  - Complicated psychosocial situation - Theodore's family has expressed concern about her fiance's ability to care for her at home.   Recommendations/Plan: Continue full scope interventions Radiation therapy as scheduled PMT will continue to follow  Symptom Management: OxyContin 15 mg twice daily Dilaudid 4 mg tablet every 4 hours Continue Dilaudid 1 mg IV every 2 hours as needed for breakthrough pain Scheduled ondansetron 4 mg IV every 8 hours x 6 doses Dexamethasone 4 mg daily Bowel regimen as ordered  Primary decision maker: Patient currently seems to have some insight into her condition and can participate in [place], her health care proxy would be her mother followed by her sister   Code Status: Full   Thank you for allowing the Palliative Medicine Team to assist in the care of this patient.  Mod MDM   Loistine Chance, MD   Please contact Palliative Medicine Team phone at [phone] for questions and concerns.  For individual providers, please see AMION.

## 2022-08-12 NOTE — Progress Notes (Signed)
Bedside nurse spoke with patient to discuss morning labs. Pt stated she is okay if lab tried to redraw around 1200. Provider and phlebotomy notified.

## 2022-08-12 NOTE — Evaluation (Signed)
Physical Therapy Evaluation Patient Details Name: Natalie Hinton MRN: [id number] DOB: [birth year] Today's Date: 08/12/2022  History of Present Illness  37yo female who presented to the ED with AMS and abnormal eye movements. 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 PMH anxiety, thyroid disease  Clinical Impression   Pt received in bed, family present and in room for PT evaluation. At this point she is able to mobilize well with no more than Min guard/Min cues for navigation in room due to impaired vision, however given medical diagnosis/prognosis, her functional ability and tolerance to mobility may change rapidly. At this time she would like to go home with family and HHPT- with equipment and assist levels as below I think this is reasonable however we will update our recommendations as/if needs change. Left in bed with all needs met, family present. At this point she would like PT to continue working with her in the hospital.      Recommendations for follow up therapy are one component of a multi-disciplinary discharge planning process, led by the attending physician.  Recommendations may be updated based on patient status, additional functional criteria and insurance authorization.  Follow Up Recommendations Home health PT      Assistance Recommended at Discharge Frequent or constant Supervision/Assistance  Patient can return home with the following  A little help with walking and/or transfers;Assistance with cooking/housework;Assist for transportation;A little help with bathing/dressing/bathroom;Help with stairs or ramp for entrance (will likely change given diagnosis/prognosis)    Equipment Recommendations Rolling walker (2 wheels);BSC/3in1;Hospital bed;Wheelchair cushion (measurements PT);Wheelchair (measurements PT)  Recommendations for Other Services       Functional Status  Assessment Patient has had a recent decline in their functional status and/or demonstrates limited ability to make significant improvements in function in a reasonable and predictable amount of time (given prognosis)     Precautions / Restrictions Precautions Precautions: Fall;Other (comment) Precaution Comments: poor vision R eye/double vision, watch HR, mobility/functional status may change rapidly given dx/prognosis Restrictions Weight Bearing Restrictions: No      Mobility  Bed Mobility Overal bed mobility: Needs Assistance Bed Mobility: Supine to Sit, Sit to Supine     Supine to sit: Supervision Sit to supine: Supervision   General bed mobility comments: HOB minimally elevated    Transfers Overall transfer level: Needs assistance Equipment used: Rolling walker (2 wheels) Transfers: Sit to/from Stand Sit to Stand: Min guard           General transfer comment: min guard for safety, cues for hand placement with transfers    Ambulation/Gait Ambulation/Gait assistance: Min guard Gait Distance (Feet): 12 Feet (x2) Assistive device: Rolling walker (2 wheels) Gait Pattern/deviations: Step-through pattern, Trunk flexed, Drifts right/left Gait velocity: decreased     General Gait Details: generally steady with RW but needed Min cues for navigation, tends to run into objects on the R due to poor vision; HR up to 120BPM with activity on RA  Stairs            Wheelchair Mobility    Modified Rankin (Stroke Patients Only)       Balance Overall balance assessment: Mild deficits observed, not formally tested, History of Falls  Pertinent Vitals/Pain Pain Assessment Pain Assessment: 0-10 Pain Score: 7  Pain Location: pelvis Pain Descriptors / Indicators: Discomfort Pain Intervention(s): Limited activity within patient's tolerance, Monitored during session    Home Living Family/patient expects to be  discharged to:: Private residence Living Arrangements: Other (Comment) (fiance who is disabled) Available Help at Discharge: Family Type of Home: Apartment Home Access: Stairs to enter   Technical brewer of Steps: multiple flights of steps to enter, no elevator   Home Layout: One level Home Equipment: Cane - single point Additional Comments: started feeling weak about 2 weeks ago, before this needed help putting on socks/shoes (pain in pelvis) for past 1.5 months. <1.5 months ago she was completely independent, rapid onset of pain and difficulty with mobility    Prior Function Prior Level of Function : History of Falls (last six months)             Mobility Comments: one fall in past 4 weeks- passed out, did not need cane before sx started. Fiance can help to physical lift and assist her. ADLs Comments: has needed help with shoes/socks for past month and a half     Hand Dominance        Extremity/Trunk Assessment   Upper Extremity Assessment Upper Extremity Assessment: Defer to OT evaluation    Lower Extremity Assessment Lower Extremity Assessment: Generalized weakness    Cervical / Trunk Assessment Cervical / Trunk Assessment: Normal  Communication   Communication: No difficulties  Cognition Arousal/Alertness: Awake/alert Behavior During Therapy: WFL for tasks assessed/performed, Flat affect Overall Cognitive Status: Within Functional Limits for tasks assessed                                 General Comments: able to follow directions well, interacted appropriate with PT/OT        General Comments      Exercises     Assessment/Plan    PT Assessment Patient needs continued PT services  PT Problem List Decreased balance;Decreased strength;Pain;Decreased mobility;Decreased activity tolerance;Decreased coordination;Decreased safety awareness       PT Treatment Interventions DME instruction;Functional mobility training;Balance  training;Patient/family education;Gait training;Therapeutic activities;Neuromuscular re-education;Wheelchair mobility training;Therapeutic exercise;Stair training;Cognitive remediation    PT Goals (Current goals can be found in the Care Plan section)  Acute Rehab PT Goals Patient Stated Goal: maintain mobility as able, go home PT Goal Formulation: With patient/family Time For Goal Achievement: 08/26/22 Potential to Achieve Goals: Fair    Frequency Min 3X/week     Co-evaluation               AM-PAC PT "6 Clicks" Mobility  Outcome Measure Help needed turning from your back to your side while in a flat bed without using bedrails?: None Help needed moving from lying on your back to sitting on the side of a flat bed without using bedrails?: None Help needed moving to and from a bed to a chair (including a wheelchair)?: A Little Help needed standing up from a chair using your arms (e.g., wheelchair or bedside chair)?: A Little Help needed to walk in hospital room?: A Little Help needed climbing 3-5 steps with a railing? : A Lot 6 Click Score: 19    End of Session   Activity Tolerance: Patient tolerated treatment well;Patient limited by fatigue Patient left: in bed;with call bell/phone within reach;with family/visitor present Nurse Communication: Mobility status;Other (comment) (palliative MD in room for part of eval, aware of  PT reccs) PT Visit Diagnosis: Muscle weakness (generalized) (M62.81);Unsteadiness on feet (R26.81);History of falling (Z91.81);Pain Pain - Right/Left:  (pelvis) Pain - part of body:  (pelvis)    Time: 1017-5102 PT Time Calculation (min) (ACUTE ONLY): 25 min   Charges:   PT Evaluation $PT Eval High Complexity: 1 High (co-eval with OT)         Deniece Ree PT DPT PN2

## 2022-08-13 ENCOUNTER — Other Ambulatory Visit: Payer: Self-pay

## 2022-08-13 ENCOUNTER — Ambulatory Visit
Admission: RE | Admit: 2022-08-13 | Discharge: 2022-08-13 | Disposition: A | Discharge status: Home / Self Care | Payer: Medicaid Other | Source: Ambulatory Visit | Attending: Radiation Oncology | Admitting: Radiation Oncology

## 2022-08-13 DIAGNOSIS — G9340 Encephalopathy, unspecified: Secondary | ICD-10-CM | POA: Diagnosis not present

## 2022-08-13 LAB — RAD ONC ARIA SESSION SUMMARY
Course Elapsed Days: 3
Plan Fractions Treated to Date: 3
Plan Prescribed Dose Per Fraction: 3 Gy
Plan Total Fractions Prescribed: 10
Plan Total Prescribed Dose: 30 Gy
Reference Point Dosage Given to Date: 9 Gy
Reference Point Session Dosage Given: 3 Gy
Session Number: 4

## 2022-08-13 LAB — VITAMIN B1: Vitamin B1 (Thiamine): 101.1 nmol/L (ref 66.5–200.0)

## 2022-08-13 MED ORDER — OXYCODONE HCL ER 15 MG PO T12A
15.0000 mg | EXTENDED_RELEASE_TABLET | Freq: Three times a day (TID) | ORAL | Status: DC
  Administered 2022-08-13 – 2022-08-16 (×9): 15 mg via ORAL
  Filled 2022-08-13 (×9): qty 1

## 2022-08-13 MED ORDER — BISACODYL 10 MG RE SUPP: 10.0000 mg | Freq: Once | RECTAL | Status: DC

## 2022-08-13 MED ORDER — HYDROMORPHONE HCL 4 MG PO TABS
6.0000 mg | ORAL_TABLET | ORAL | Status: DC
  Administered 2022-08-13 – 2022-08-16 (×19): 6 mg via ORAL
  Filled 2022-08-13 (×19): qty 1

## 2022-08-13 MED ORDER — POLYETHYLENE GLYCOL 3350 17 G PO PACK
17.0000 g | PACK | Freq: Two times a day (BID) | ORAL | Status: DC
  Filled 2022-08-13 (×5): qty 1

## 2022-08-13 NOTE — Progress Notes (Signed)
Physical Therapy Treatment Patient Details Name: Natalie Hinton MRN: [id number] DOB: 1985-08-26 Today's Date: 08/13/2022   History of Present Illness 37yo female who presented to the ED with AMS and abnormal eye movements. 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 PMH anxiety, thyroid disease    PT Comments    General Comments: AxO x 3 pleasant, willing.  following all directions. Assisted OOB to amb in hallway.  General Gait Details: tolerated an increased distance 75 feet in hallway with walker.  Mildly unsteady.  Lateral instability.  Max c/o fatigue. Pt plans to D/C to home.   Recommendations for follow up therapy are one component of a multi-disciplinary discharge planning process, led by the attending physician.  Recommendations may be updated based on patient status, additional functional criteria and insurance authorization.  Follow Up Recommendations  Home health PT     Assistance Recommended at Discharge Frequent or constant Supervision/Assistance  Patient can return home with the following A little help with walking and/or transfers;Assistance with cooking/housework;Assist for transportation;A little help with bathing/dressing/bathroom;Help with stairs or ramp for entrance   Equipment Recommendations  Rolling walker (2 wheels);BSC/3in1;Hospital bed;Wheelchair cushion (measurements PT);Wheelchair (measurements PT)    Recommendations for Other Services       Precautions / Restrictions Precautions Precautions: Fall Precaution Comments: poor vision R eye/double vision, Restrictions Weight Bearing Restrictions: No     Mobility  Bed Mobility Overal bed mobility: Needs Assistance Bed Mobility: Supine to Sit, Sit to Supine     Supine to sit: Supervision Sit to supine: Supervision   General bed mobility comments: self able with increeased time and use of rail     Transfers Overall transfer level: Needs assistance Equipment used: Rolling walker (2 wheels) Transfers: Sit to/from Stand Sit to Stand: Supervision, Min guard           General transfer comment: min guard for safety, cues for hand placement with transfers    Ambulation/Gait Ambulation/Gait assistance: Supervision, Min guard Gait Distance (Feet): 75 Feet Assistive device: Rolling walker (2 wheels) Gait Pattern/deviations: Step-through pattern, Trunk flexed, Drifts right/left Gait velocity: decreased     General Gait Details: tolerated an increased distance 75 feet in hallway with walker.  Mildly unsteady.  Lateral instability.  Max c/o fatigue.   Stairs             Wheelchair Mobility    Modified Rankin (Stroke Patients Only)       Balance                                            Cognition Arousal/Alertness: Awake/alert Behavior During Therapy: WFL for tasks assessed/performed Overall Cognitive Status: Within Functional Limits for tasks assessed                                 General Comments: AxO x 3 pleasant, willing.  following all directions.        Exercises      General Comments        Pertinent Vitals/Pain Pain Assessment Pain Assessment: Faces Faces Pain Scale: Hurts a little bit Pain Location: general    Home Living  Prior Function            PT Goals (current goals can now be found in the care plan section) Progress towards PT goals: Progressing toward goals    Frequency    Min 3X/week      PT Plan Current plan remains appropriate    Co-evaluation              AM-PAC PT "6 Clicks" Mobility   Outcome Measure  Help needed turning from your back to your side while in a flat bed without using bedrails?: A Little Help needed moving from lying on your back to sitting on the side of a flat bed without using bedrails?: A Little Help needed moving  to and from a bed to a chair (including a wheelchair)?: A Little Help needed standing up from a chair using your arms (e.g., wheelchair or bedside chair)?: A Little Help needed to walk in hospital room?: A Little Help needed climbing 3-5 steps with a railing? : A Lot 6 Click Score: 17    End of Session Equipment Utilized During Treatment: Gait belt Activity Tolerance: Patient limited by fatigue Patient left: in bed;with call bell/phone within reach;with family/visitor present Nurse Communication: Mobility status;Other (comment) PT Visit Diagnosis: Muscle weakness (generalized) (M62.81);Unsteadiness on feet (R26.81);History of falling (Z91.81);Pain     Time: 1352-1410 PT Time Calculation (min) (ACUTE ONLY): 18 min  Charges:  $Gait Training: 8-22 mins                     Rica Koyanagi  PTA [place] [facility] M-F          [phone] Weekend pager [phone]

## 2022-08-13 NOTE — Progress Notes (Signed)
PROGRESS NOTE  Natalie Hinton  [id number] DOB: [birth year] DOA: 08/07/2022 PCP: [name], MD   Brief Narrative: Patient is a 37 year old female with history of widespread metastatic cancer of unknown primary presented with altered mental status, visual hallucination, headache, abnormal eye movement.  On presentation, she was encephalopathic and was admitted for further evaluation.CTA head and neck showed severe narrowing of both ICA at the skull base, bilateral necrotic mediastinal LAD, numerous large bilateral pulmonary metastasis and partially necrotic right supraventricular lymph node.   MRI brain W WO contrast showed large sellar lesion extending into bilateral cavernous sinuses and sphenoid sinus resulting in narrowing of bilateral ICA. Neurology, oncology, and oncology and palliative medicine consulted.  Transferred to [facility] for palliative radiation.  Oncology recommended comfort care/hospice.  Radiation oncology recommending radiation treatment to the base of skull.  Palliative care consulted  for goals of care.  Plan is to remain as full code and continue treatment if possible.  Radiation oncology started radiation treatment.  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  Brain imagings as above.  Ammonia, vitamin B12 level normal.  Has right lower extremity weakness.left facial droop.  Neurology signed off. EEG showed mild diffuse encephalopathy but no seizure or epileptiform discharge.  She remains alert and oriented now  Metastatic cancer with unknown primary:  She had supraclavicular lymph node biopsy on 1/7.  Pathology showed poorly differentiated carcinoma. Widespread mets including lung, mediastinum, pelvis.  CA 19-19, CEA marked elevated.  Oncology was following and recommended comfort care/hospice.  Radiation oncology also following.   Radiation oncology  started radiation treatment to base of skull, can be done as an outpatient if she is discharged.  Fever: Spikes fever intermittently: Most likely from malignancy.  Continue Tylenol as needed.  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  Patient also had a strokelike symptoms with abnormal movement, right lower extremity weakness all from metastatic effect  Severe protein calorie malnutrition: BMI of 13.6  Constipation: Continue bowel regimen. Had finally a bowel movement after enema was given  Goals of care: Very poor prognosis on this young patient with widespread metastatic disease.  Patient is also severely malnourished.  Very deconditioned.  Oncology recommended comfort care.  Palliative care discussed with family about goals of care.  Patient and family wanted to continue full scope of treatment, radiation therapy.  Debility/deconditioning: PT/OT recommend home health.  Plan for discharge after adequate pain control     Nutrition Problem: Severe Malnutrition Etiology: acute illness (newly dx cancer)    DVT prophylaxis:SCDs Start: 08/08/22 0358     Code Status: Full Code  Family Communication: Fiance at bedside  Patient status:Inpatient  Patient is from :Home  Anticipated discharge to: Home  Estimated DC date:1-2 days, depending upon pain control.  Patient is still says that she has significant pain and not ready for discharge home.  Consultants: Palliative care, oncology, neurology  Procedures: None during this hospitalization  Antimicrobials:  Anti-infectives (From admission, onward)    None       Subjective: Patient seen and examined at bedside today.  Hemodynamically stable.  Overall looks comfortable but was asking for pain medications.  Had a bowel movement finally after enema was given.  Discussed that she is medically ready from our side if the pain is adequately controlled but she says she is not  ready  Objective: Vitals:  08/12/22 0812 08/12/22 1351 08/12/22 2021 08/13/22 0407  BP: 122/86 117/88 108/74 (!) 131/93  Pulse: (!) 108 (!) 101 100 (!) 103  Resp: 17 14 20 18  $ Temp: 98.1 F (36.7 C) 98.3 F (36.8 C) 97.6 F (36.4 C) 97.7 F (36.5 C)  TempSrc: Oral Oral Oral Oral  SpO2: 98% 94% 97% 94%  Weight:      Height:        Intake/Output Summary (Last 24 hours) at 08/13/2022 1018 Last data filed at 08/13/2022 1009 Gross per 24 hour  Intake 597 ml  Output --  Net 597 ml    Filed Weights   08/07/22 1941  Weight: 31.8 kg    Examination:  General exam: Overall comfortable, not in distress, deconditioned, thin built HEENT: PERRL, left facial droop Respiratory system:  no wheezes or crackles  Cardiovascular system: S1 & S2 heard, RRR.  Gastrointestinal system: Abdomen is nondistended, soft and nontender. Central nervous system: Alert and oriented, right lower extremity weakness Extremities: No edema, no clubbing ,no cyanosis Skin: No rashes, no ulcers,no icterus       Data Reviewed: I have personally reviewed following labs and imaging studies  CBC: Recent Labs  Lab 08/07/22 2123 08/08/22 0445 08/12/22 1222  WBC 13.9* 13.0* 13.6*  HGB 11.5* 11.2* 13.1  HCT 34.3* 33.0* 40.4  MCV 85.5 84.2 87.1  PLT 499* 518* [id number]   Basic Metabolic Panel: Recent Labs  Lab 08/07/22 2123 08/08/22 0445 08/12/22 2249  NA 130* 133* 137  K 2.9* 3.9 4.0  CL 92* 95* 91*  CO2 25 25 29  $ GLUCOSE 79 99 155*  BUN 7 <5* 9  CREATININE 0.52 0.48 0.57  CALCIUM 9.4 9.3 8.9  MG 1.9 2.0  --   PHOS  --  3.6  --      No results found for this or any previous visit (from the past 240 hour(s)).   Radiology Studies: No results found.  Scheduled Meds:  bisacodyl  10 mg Rectal Once   dexamethasone  4 mg Oral Daily   feeding supplement  237 mL Oral TID BM   folic acid  1 mg Oral Daily   HYDROmorphone  4 mg Oral Q4H   multivitamin with minerals  1 tablet Oral Daily   nicotine  21  mg Transdermal Daily   ondansetron (ZOFRAN) IV  4 mg Intravenous Q8H   oxyCODONE  15 mg Oral Q12H   polyethylene glycol  17 g Oral Daily   senna-docusate  1 tablet Oral BID   thiamine  100 mg Oral Daily   Continuous Infusions:  lactated ringers Stopped (08/11/22 1601)     LOS: 5 days   Shelly Coss, MD [facility] P2/03/2023, 10:18 AM

## 2022-08-13 NOTE — Plan of Care (Signed)
  Problem: Pain Managment: Goal: General experience of comfort will improve Outcome: Not Progressing   Problem: Safety: Goal: Ability to remain free from injury will improve Outcome: Not Progressing   

## 2022-08-13 NOTE — Progress Notes (Signed)
   08/13/22 1325  Spiritual Encounters  Type of Visit Initial  Care provided to: [facility] partners present during encounter Nurse  Referral source Chaplain team  Reason for visit Trauma  OnCall Visit Yes   Chaplain attempted to meet with the patient's mother who I understood was interested in speaking with someone on the spiritual care and counseling team. She had recently left the hospital. The patient's nurse called her but the mother did not wish to speak with anyone at this time.  The nurse told her if she changed her mind a visit could be arranged again.   Trish [employer].

## 2022-08-13 NOTE — Progress Notes (Signed)
Palliative Medicine Progress Note   Patient Name: Natalie Hinton       Date: 08/13/2022 DOB: Oct 15, 1985  Age: 37 y.o. MRN#: [id number] Attending Physician: Shelly Coss, MD Primary Care Physician: [name], MD Admit Date: 08/07/2022   HPI/Patient Profile: 37 y.o. 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  She presented to [facility] ED on 08/07/2022 with altered mental status and abnormal eye movements.   CTA head showed bulky necrotic mediastinal lymphadenopathy and numerous large bilateral pulmonary metastases as well as necrotic right supraclavicular lymph node. Admitted to [facility] service for further evaluation of acute metabolic encephalopathy.  Subsequent imaging ultimately revealed brain metastasis. She was transferred to [facility] on 2/5 to start radiation.    Palliative Medicine was consulted for pain management and goals of care in the setting of metastatic cancer.  Subjective: Patient resting in bed, history reviewed, patient still requiring IV hydromorphone on an ongoing basis.  Discussed with patient as well as cousin as well as [facility] MD about pain management efforts.  See below  Goals of Care: Wishes to go home towards the end of this hospitalization Patient wishes to continue radiation treatments and understands that that they are palliative in nature and that she has advanced cancer PT OT working closely with the patient to help determine best services going forward Patient does not wish to discuss about hospice services currently. Full code for now     Objective:  Physical Exam Vitals reviewed.  Constitutional:      General: She is not in acute distress.    Appearance: She is cachectic. She  is ill-appearing.  Pulmonary:     Effort: Pulmonary effort is normal.  Neurological:     Mental Status: She is alert.             Vital Signs: BP (!) 131/93 (BP Location: Left Arm)   Pulse (!) 103   Temp 97.7 F (36.5 C) (Oral)   Resp 18   Ht 5' (1.524 m)   Wt 31.8 kg   SpO2 94%   BMI 13.67 kg/m  SpO2: SpO2: 94 % O2 Device: O2 Device: Room Air O2 Flow Rate:     Palliative Medicine Assessment & Plan   Assessment: 37 year old female with widely  metastatic cancer of unknown primary, admitted with AMS. Found to have brain metastasis.  - poor prognosis overall - uncontrolled pain  - Complicated psychosocial situation - Tykira's family has expressed concern about her fiance's ability to care for her at home.   Recommendations/Plan: Continue full scope interventions Radiation therapy as scheduled PMT will continue to follow  Symptom Management: OxyContin 15 mg Q 8 hours Dilaudid 6 mg tablet every 4 hours Continue Dilaudid 1 mg IV every 2 hours as needed for breakthrough pain: Discussed with patient about her current pain regimen. Scheduled ondansetron 4 mg IV every 8 hours x 6 doses Dexamethasone 4 mg daily Bowel regimen as ordered: Will also add Dulcolax suppository today.  Primary decision maker: Patient currently seems to have some insight into her condition and can participate in [place] law, her health care proxy would be her mother followed by her sister  Call placed and discussed with patient's cousin [name].  Discussed with her about pain and known pain symptom management.  Discussed with her about bowel regimen. Code Status: Full   Thank you for allowing the Palliative Medicine Team to assist in the care of this patient.  Mod MDM   Loistine Chance, MD   Please contact Palliative Medicine Team phone at [phone] for questions and concerns.  For individual providers, please see AMION.

## 2022-08-13 NOTE — Progress Notes (Signed)
0610- soap sud enema done, bath materials left at bathroom as patient requested to take a bath.

## 2022-08-14 DIAGNOSIS — G9340 Encephalopathy, unspecified: Secondary | ICD-10-CM | POA: Diagnosis not present

## 2022-08-14 NOTE — Plan of Care (Signed)
  Problem: Elimination: Goal: Will not experience complications related to bowel motility Outcome: Not Progressing   Problem: Safety: Goal: Ability to remain free from injury will improve Outcome: Not Progressing   Problem: Skin Integrity: Goal: Risk for impaired skin integrity will decrease Outcome: Not Progressing   Problem: Pain Managment: Goal: General experience of comfort will improve Outcome: Not Progressing

## 2022-08-14 NOTE — Progress Notes (Signed)
Yellow MEWS noted, heart rate ranging from 123-129 even at resting state, of note, patient was smoking in the room prior to taking vital signs, informed Dr. Sabino Gasser, nothing to be done at the moment, Charge nurse Hildred Alamin made aware.     08/14/22 0540 08/14/22 0555 08/14/22 [id number]  Assess: MEWS Score  Temp 98.7 F (37.1 C)  --   --   BP (!) 128/105 (!) 138/97  --   MAP (mmHg) 113 107  --   Pulse Rate (!) 126 (!) 123  --   ECG Heart Rate  --   --  (!) 116  Resp 18  --   --   SpO2 99 %  --   --   O2 Device Room Air  --   --   Assess: MEWS Score  MEWS Temp 0 0 0  MEWS Systolic 0 0 0  MEWS Pulse 2 2 2  $ MEWS RR 0 0 0  MEWS LOC 0 0 0  MEWS Score 2 2 2  $ MEWS Score Color Yellow Yellow Yellow  Assess: if the MEWS score is Yellow or Red  Were vital signs taken at a resting state?  --  Yes  --   Focused Assessment  --  Change from prior assessment (see assessment flowsheet)  --   Does the patient meet 2 or more of the SIRS criteria?  --  No  --   Does the patient have a confirmed or suspected source of infection?  --  No  --   Provider and Rapid Response Notified?  --  Yes  --   MEWS guidelines implemented   --  Yes, yellow  --   Treat  MEWS Interventions  --  Considered administering scheduled or prn medications/treatments as ordered  --   MEWS Interventions  --  Escalated (See documentation below)  --   Take Vital Signs  Increase Vital Sign Frequency   --  Yellow: Q2hr x1, continue Q4hrs until patient remains green for 12hrs  --   Increase Vital Sign Frequency   --  Yellow: Q 2hr X 2 then Q 4hr X 2, if remains yellow, continue Q 4hrs  --   Escalate  MEWS: Escalate  --  Yellow: Discuss with charge nurse and consider notifying provider and/or RRT  --   Notify: Charge Nurse/RN  Name of Charge Nurse/RN Notified  --  Oceanographer  --   Date Charge Nurse/RN Notified  --  08/14/22  --   Time Charge Nurse/RN Notified  --  [phone]  --   Provider Notification  Provider Name/Title  --  Dr. Sabino Gasser  --    Date Provider Notified  --  08/14/22  --   Time Provider Notified  --  [phone]  --   Method of Notification  --  Page  --   Notification Reason  --   (yellow MEWS HR at 123)  --   Provider response  --  No new orders  --   Date of Provider Response  --  08/14/22  --   Time of Provider Response  --  [phone]  --   Assess: SIRS CRITERIA  SIRS Temperature  0 0 0  SIRS Pulse 1 1 1  $ SIRS Respirations  0 0 0  SIRS WBC 0 0 0  SIRS Score Sum  1 1 1

## 2022-08-14 NOTE — Progress Notes (Signed)
Palliative Medicine Progress Note   Patient Name: Natalie Hinton       Date: 08/14/2022 DOB: July 22, 1985  Age: 37 y.o. MRN#: [id number] Attending Physician: Shelly Coss, MD Primary Care Physician: [name], MD Admit Date: 08/07/2022   HPI/Patient Profile: 37 y.o. 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  She presented to [facility] ED on 08/07/2022 with altered mental status and abnormal eye movements.   CTA head showed bulky necrotic mediastinal lymphadenopathy and numerous large bilateral pulmonary metastases as well as necrotic right supraclavicular lymph node. Admitted to [facility] service for further evaluation of acute metabolic encephalopathy.  Subsequent imaging ultimately revealed brain metastasis. She was transferred to [facility] on 2/5 to start radiation.    Palliative Medicine was consulted for pain management and goals of care in the setting of metastatic cancer.  Subjective: Patient resting in bed, history reviewed, patient still requiring IV hydromorphone on an ongoing basis.  Discussed with patient about continuing PO pain medication and only resorting to IV Dilaudid as last resort. She has had a bowel movement. Was unfortunately smoking in her room, she is tearful when I bring this up. She states that she is not sure whether she will get pain medications in the outpatient setting. I have reassured her that she will be given pain medication on discharge and that she will be set up to follow with PMT at [facility] long clinic as well.     Goals of Care: Wishes to go home towards the end of this hospitalization Patient wishes to continue radiation treatments and understands that that they are palliative in  nature and that she has advanced cancer PT OT working closely with the patient to help determine best services going forward Patient does not wish to discuss about hospice services currently. Full code for now     Objective:  Physical Exam Vitals reviewed.  Constitutional:      General: She is not in acute distress.    Appearance: She is cachectic. She is ill-appearing.  Pulmonary:     Effort: Pulmonary effort is normal.  Neurological:     Mental Status: She is alert.             Vital Signs: BP 116/82 (BP Location: Left Arm)  Pulse (!) 122   Temp 97.9 F (36.6 C) (Oral)   Resp 18   Ht 5' (1.524 m)   Wt 31.8 kg   SpO2 93%   BMI 13.67 kg/m  SpO2: SpO2: 93 % O2 Device: O2 Device: Room Air O2 Flow Rate:     Palliative Medicine Assessment & Plan   Assessment: 37 year old female with widely metastatic cancer of unknown primary, admitted with AMS. Found to have brain metastasis.  - poor prognosis overall - uncontrolled pain  - Complicated psychosocial situation - Natalie Hinton's family has expressed concern about her fiance's ability to care for her at home.   Recommendations/Plan: Continue full scope interventions Radiation therapy as scheduled PMT will continue to follow  Symptom Management: OxyContin 15 mg Q 8 hours Dilaudid 6 mg tablet every 4 hours Continue Dilaudid 1 mg IV every 2 hours as needed for breakthrough pain: Discussed with patient about her current pain regimen. To use only for breakthrough pain.  Scheduled ondansetron 4 mg IV every 8 hours x 6 doses Dexamethasone 4 mg daily Bowel regimen as ordered: had a bowel movement in the past 24 hours.   Primary decision maker: Patient currently seems to have some insight into her condition and can participate in Natalie Hinton [place] law, her health care proxy would be her mother followed by her sister  Natalie Hinton to d/c whenever patient is medically ready.  Patient to see my colleague Natalie Natalie Hinton in the palliative clinic at  the [place] on  2/13 @10$  AM.  Code Status: Full   Thank you for allowing the Palliative Medicine Team to assist in the care of this patient.  Mod MDM   Loistine Chance, MD   Please contact Palliative Medicine Team phone at [phone] for questions and concerns.  For individual providers, please see AMION.

## 2022-08-14 NOTE — Progress Notes (Signed)
At 0555 this nurse came to recheck patient's vitals, patient seen standing at side of bed with strong scent of cigarette lingering in room, asked patient if she was smoking and patient said yes, reiterated to her that it is against hospital policy to smoke inside premises, e-cigarette confiscated and will be sent to security for safekeeping. Patient has smoked in dayshift as well per hand off report.

## 2022-08-14 NOTE — Progress Notes (Signed)
PROGRESS NOTE  Natalie Hinton  [id number] DOB: [birth year] DOA: 08/07/2022 PCP: [name], MD   Brief Narrative: Patient is a 37 year old female with history of widespread metastatic cancer of unknown primary presented with altered mental status, visual hallucination, headache, abnormal eye movement.  On presentation, she was encephalopathic and was admitted for further evaluation.CTA head and neck showed severe narrowing of both ICA at the skull base, bilateral necrotic mediastinal LAD, numerous large bilateral pulmonary metastasis and partially necrotic right supraventricular lymph node.   MRI brain W WO contrast showed large sellar lesion extending into bilateral cavernous sinuses and sphenoid sinus resulting in narrowing of bilateral ICA. Neurology, oncology, and oncology and palliative medicine consulted.  Transferred to [facility] for palliative radiation.  Oncology recommended comfort care/hospice.  Radiation oncology recommending radiation treatment to the base of skull.  Palliative care consulted  for goals of care.  Plan is to remain as full code and continue treatment if possible.  Radiation oncology started radiation treatment.  Hospital course remarkable for persistent pain delaying discharge.  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  Brain imagings as above.  Ammonia, vitamin B12 level normal.  Has right lower extremity weakness.left facial droop.  Neurology signed off. EEG showed mild diffuse encephalopathy but no seizure or epileptiform discharge.  She remains alert and oriented now  Metastatic cancer with unknown primary:  She had supraclavicular lymph node biopsy on 1/7.  Pathology showed poorly differentiated carcinoma. Widespread mets including lung, mediastinum, pelvis.  CA 19-19, CEA marked elevated.  Oncology was following and recommended comfort care/hospice.  Radiation oncology also following.   Radiation oncology  started radiation treatment to base of skull, can be done as an outpatient if she is discharged.  Intractable pain: Complains of pain mainly around the pelvic area radiating to the groin.  I asked for IV Dilaudid throughout the clock.  Continues to complain of pain.  Sinus tachycardia: This is also most likely triggered by pain.  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  Patient also had a strokelike symptoms with abnormal movement, right lower extremity weakness all from metastatic effect  Severe protein calorie malnutrition: BMI of 13.6  Constipation: Continue aggressive bowel regimen. Had finally a bowel movement after enema was given.  Goals of care: Very poor prognosis on this young patient with widespread metastatic disease.  Patient is also severely malnourished.  Very deconditioned.  Oncology recommended comfort care.  Palliative care discussed with family about goals of care.  Patient and family wanted to continue full scope of treatment, radiation therapy.  Debility/deconditioning: PT/OT recommend home health.  Plan for discharge after adequate pain control     Nutrition Problem: Severe Malnutrition Etiology: acute illness (newly dx cancer)    DVT prophylaxis:SCDs Start: 08/08/22 0358     Code Status: Full Code  Family Communication: Fiance at bedside  Patient status:Inpatient  Patient is from :Home  Anticipated discharge to: Home  Estimated DC date:1-2 days, depending upon pain control.  Patient is still says that she has significant pain and not ready for discharge home.  Consultants: Palliative care, oncology, neurology  Procedures: None during this hospitalization  Antimicrobials:  Anti-infectives (From admission, onward)    None       Subjective: Patient seen and examined at bedside today.  She was in sinus tachycardia.  When I entered the room, she looked overall comfortable, lying on bed.  She  had a bowel movement yesterday.  Continues to complain of lower abdominal pain, radiating to both upper thighs.  Does not look in significant distress.  Does not feel ready to go home.  Objective: Vitals:   08/14/22 0555 08/14/22 0731 08/14/22 0930 08/14/22 1024  BP: (!) 138/97 (!) 124/96 (!) 126/98 116/82  Pulse: (!) 123 (!) 117 (!) 120 (!) 122  Resp:  18 18 18  $ Temp:  98.8 F (37.1 C)  97.9 F (36.6 C)  TempSrc:  Oral  Oral  SpO2:  96% 95% 93%  Weight:      Height:        Intake/Output Summary (Last 24 hours) at 08/14/2022 1036 Last data filed at 08/14/2022 [id number] Gross per 24 hour  Intake 120 ml  Output --  Net 120 ml    Filed Weights   08/07/22 1941  Weight: 31.8 kg    Examination:  General exam: Overall comfortable, not in distress, very deconditioned and malnourished HEENT: Right sided ptosis, left facial droop Respiratory system:  no wheezes or crackles  Cardiovascular system: Sinus tachycardia Gastrointestinal system: Abdomen is nondistended, soft and nontender. Central nervous system: Alert and oriented Extremities: No edema, no clubbing ,no cyanosis Skin: No rashes, no ulcers,no icterus     Data Reviewed: I have personally reviewed following labs and imaging studies  CBC: Recent Labs  Lab 08/07/22 2123 08/08/22 0445 08/12/22 1222  WBC 13.9* 13.0* 13.6*  HGB 11.5* 11.2* 13.1  HCT 34.3* 33.0* 40.4  MCV 85.5 84.2 87.1  PLT 499* 518* [id number]   Basic Metabolic Panel: Recent Labs  Lab 08/07/22 2123 08/08/22 0445 08/12/22 2249  NA 130* 133* 137  K 2.9* 3.9 4.0  CL 92* 95* 91*  CO2 25 25 29  $ GLUCOSE 79 99 155*  BUN 7 <5* 9  CREATININE 0.52 0.48 0.57  CALCIUM 9.4 9.3 8.9  MG 1.9 2.0  --   PHOS  --  3.6  --      No results found for this or any previous visit (from the past 240 hour(s)).   Radiology Studies: No results found.  Scheduled Meds:  dexamethasone  4 mg Oral Daily   feeding supplement  237 mL Oral TID BM   folic acid  1 mg Oral  Daily   HYDROmorphone  6 mg Oral Q4H   multivitamin with minerals  1 tablet Oral Daily   nicotine  21 mg Transdermal Daily   oxyCODONE  15 mg Oral Q8H   polyethylene glycol  17 g Oral BID   senna-docusate  1 tablet Oral BID   thiamine  100 mg Oral Daily   Continuous Infusions:     LOS: 6 days   Shelly Coss, MD [facility] P2/04/2023, 10:36 AM

## 2022-08-15 DIAGNOSIS — G9340 Encephalopathy, unspecified: Secondary | ICD-10-CM | POA: Diagnosis not present

## 2022-08-15 MED ORDER — MEGESTROL ACETATE 40 MG PO TABS
160.0000 mg | ORAL_TABLET | Freq: Every day | ORAL | Status: DC
  Administered 2022-08-15 – 2022-08-16 (×2): 160 mg via ORAL
  Filled 2022-08-15 (×2): qty 4

## 2022-08-15 NOTE — Progress Notes (Signed)
PROGRESS NOTE  Lawson CLAUDETTA FRAGER  [id number] DOB: 08-25-1985 DOA: 08/07/2022 PCP: [name], MD   Brief Narrative: Patient is a 37 year old female with history of widespread metastatic cancer of unknown primary presented with altered mental status, visual hallucination, headache, abnormal eye movement.  On presentation, she was encephalopathic and was admitted for further evaluation.CTA head and neck showed severe narrowing of both ICA at the skull base, bilateral necrotic mediastinal LAD, numerous large bilateral pulmonary metastasis and partially necrotic right supraventricular lymph node.   MRI brain W WO contrast showed large sellar lesion extending into bilateral cavernous sinuses and sphenoid sinus resulting in narrowing of bilateral ICA. Neurology, oncology, and oncology and palliative medicine consulted.  Transferred to [facility] for palliative radiation.  Oncology recommended comfort care/hospice.  Radiation oncology recommending radiation treatment to the base of skull.  Palliative care consulted  for goals of care.  Plan is to remain as full code and continue treatment if possible.  Radiation oncology started radiation treatment.  Hospital course remarkable for persistent pain delaying discharge.  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  Brain imagings as above.  Ammonia, vitamin B12 level normal.  Has right lower extremity weakness.left facial droop.  Neurology signed off. EEG showed mild diffuse encephalopathy but no seizure or epileptiform discharge.  She remains alert and oriented now  Metastatic cancer with unknown primary:  She had supraclavicular lymph node biopsy on 1/7.  Pathology showed poorly differentiated carcinoma. Widespread mets including lung, mediastinum, pelvis.  CA 19-19, CEA marked elevated.  Oncology was following and recommended  comfort care/hospice.  Radiation oncology also following.  Radiation oncology  started radiation treatment to base of skull, can be done as an outpatient.  Intractable pain: Complains of pain mainly around the pelvic area radiating to the groin.Continues to complain of pain.  Long discussion had at the bedside about her pain management and the need of continue taking oral pain medications on discharge  Sinus tachycardia: This is also most likely triggered by pain.  Continue to monitor.  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  Patient also had a strokelike symptoms with abnormal movement, right lower extremity weakness all from metastatic effect  Severe protein calorie malnutrition: BMI of 13.6  Constipation: Continue aggressive bowel regimen.  Goals of care: Very poor prognosis on this young patient with widespread metastatic disease.  Patient is also severely malnourished.  Very deconditioned.  Oncology recommended comfort care.  Palliative care discussed with family about goals of care.  Patient and family wanted to continue full scope of treatment, radiation therapy.  Debility/deconditioning: PT/OT recommend home health.  Plan for discharge after adequate pain control     Nutrition Problem: Severe Malnutrition Etiology: acute illness (newly dx cancer)    DVT prophylaxis:SCDs Start: 08/08/22 0358     Code Status: Full Code  Family Communication: Sister at bedside  Patient status:Inpatient  Patient is from :Home  Anticipated discharge to: Home  Estimated DC date: Tomorrow  Consultants: Palliative care, oncology, neurology  Procedures: None during this hospitalization  Antimicrobials:  Anti-infectives (From admission, onward)    None       Subjective: Patient seen and examined at bedside today.  Hemodynamically stable.  She looked overall comfortable.  She was asking for IV pain medication.  Long discussion held at the bedside again today  about the need of utilizing  the oral pain medication rather than relying on IV.  She was also asking for medicine that can boost her appetite.  Objective: Vitals:   08/14/22 1344 08/14/22 1758 08/14/22 2115 08/15/22 0450  BP: 112/75 117/76 (!) 106/91 103/79  Pulse: (!) 115 83 60 73  Resp: 18 18 18 18  $ Temp: 99.2 F (37.3 C) 99 F (37.2 C) 98 F (36.7 C) 97.8 F (36.6 C)  TempSrc: Oral Oral Oral Oral  SpO2: 94% 95% 98% 100%  Weight:      Height:        Intake/Output Summary (Last 24 hours) at 08/15/2022 1100 Last data filed at 08/15/2022 0820 Gross per 24 hour  Intake 600 ml  Output --  Net 600 ml    Filed Weights   08/07/22 1941  Weight: 31.8 kg    Examination:   General exam: Overall comfortable, not in distress, very deconditioned, malnourished HEENT: Right-sided ptosis Respiratory system:  no wheezes or crackles  Cardiovascular system: S1 & S2 heard, RRR.  Gastrointestinal system: Abdomen is nondistended, soft and nontender. Central nervous system: Alert and oriented Extremities: No edema, no clubbing ,no cyanosis Skin: No rashes, no ulcers,no icterus     Data Reviewed: I have personally reviewed following labs and imaging studies  CBC: Recent Labs  Lab 08/12/22 1222  WBC 13.6*  HGB 13.1  HCT 40.4  MCV 87.1  PLT [id number]   Basic Metabolic Panel: Recent Labs  Lab 08/12/22 2249  NA 137  K 4.0  CL 91*  CO2 29  GLUCOSE 155*  BUN 9  CREATININE 0.57  CALCIUM 8.9     No results found for this or any previous visit (from the past 240 hour(s)).   Radiology Studies: No results found.  Scheduled Meds:  dexamethasone  4 mg Oral Daily   feeding supplement  237 mL Oral TID BM   folic acid  1 mg Oral Daily   HYDROmorphone  6 mg Oral Q4H   megestrol  160 mg Oral Daily   multivitamin with minerals  1 tablet Oral Daily   nicotine  21 mg Transdermal Daily   oxyCODONE  15 mg Oral Q8H   polyethylene glycol  17 g Oral BID   senna-docusate  1 tablet Oral  BID   thiamine  100 mg Oral Daily   Continuous Infusions:     LOS: 7 days   Shelly Coss, MD [facility] P2/05/2023, 11:00 AM

## 2022-08-15 NOTE — Progress Notes (Signed)
Palliative Medicine Progress Note   Patient Name: Natalie Hinton       Date: 08/15/2022 DOB: Jan 22, 1986  Age: 37 y.o. MRN#: [id number] Attending Physician: Shelly Coss, MD Primary Care Physician: [name], MD Admit Date: 08/07/2022   HPI/Patient Profile: 37 y.o. 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  She presented to [facility] ED on 08/07/2022 with altered mental status and abnormal eye movements.   CTA head showed bulky necrotic mediastinal lymphadenopathy and numerous large bilateral pulmonary metastases as well as necrotic right supraclavicular lymph node. Admitted to [facility] service for further evaluation of acute metabolic encephalopathy.  Subsequent imaging ultimately revealed brain metastasis. She was transferred to [facility] on 2/5 to start radiation.    Palliative Medicine was consulted for pain management and goals of care in the setting of metastatic cancer.  Subjective: Patient resting in bed, history reviewed, patient still requiring IV hydromorphone on an ongoing basis.    Met with cousin at bedside, discussed about current pain regimen and also about patient's upcoming appointment at [facility] with palliative care.       Goals of Care: Wishes to go home towards the end of this hospitalization Patient wishes to continue radiation treatments and understands that that they are palliative in nature and that she has advanced cancer PT OT working closely with the patient to help determine best services going forward Patient does not wish to discuss about hospice services currently. Full code for now     Objective:  Physical Exam Vitals reviewed.  Constitutional:      General: She is not in  acute distress.    Appearance: She is cachectic. She is ill-appearing.  Pulmonary:     Effort: Pulmonary effort is normal.  Neurological:     Mental Status: She is alert.             Vital Signs: BP 103/79 (BP Location: Left Arm)   Pulse 73   Temp 97.8 F (36.6 C) (Oral)   Resp 18   Ht 5' (1.524 m)   Wt 31.8 kg   SpO2 100%   BMI 13.67 kg/m  SpO2: SpO2: 100 % O2 Device: O2 Device: Room Air O2 Flow Rate:     Palliative Medicine Assessment & Plan  Assessment: 37 year old female with widely metastatic cancer of unknown primary, admitted with AMS. Found to have brain metastasis.  - poor prognosis overall - uncontrolled pain  - Complicated psychosocial situation - Shanina's family has expressed concern about her fiance's ability to care for her at home.   Recommendations/Plan: Continue full scope interventions Radiation therapy as scheduled PMT will continue to follow  Symptom Management: OxyContin 15 mg Q 8 hours Dilaudid 6 mg tablet every 4 hours Continue Dilaudid 1 mg IV every 2 hours as needed for breakthrough pain: Discussed with patient about her current pain regimen. To use only for breakthrough pain.  Scheduled ondansetron 4 mg IV every 8 hours x 6 doses Dexamethasone 4 mg daily Bowel regimen as ordered:    Primary decision maker: Patient currently seems to have some insight into her condition and can participate in [place] law, her health care proxy would be her mother followed by her sister  Madaline Brilliant to d/c whenever patient is medically ready. Anticipate discharge in am on 08-16-22.  Patient to see my colleague Ms Cousar NP in the palliative clinic at the [place] on  2/13 @10$  AM.  Code Status: Full   Thank you for allowing the Palliative Medicine Team to assist in the care of this patient.  Mod MDM   Loistine Chance, MD   Please contact Palliative Medicine Team phone at [phone] for questions and concerns.  For individual providers, please see  [facility].

## 2022-08-16 ENCOUNTER — Ambulatory Visit
Admission: RE | Admit: 2022-08-16 | Discharge: 2022-08-16 | Disposition: A | Discharge status: Home / Self Care | Payer: Medicaid Other | Source: Ambulatory Visit | Attending: Radiation Oncology | Admitting: Radiation Oncology

## 2022-08-16 ENCOUNTER — Other Ambulatory Visit: Payer: Self-pay

## 2022-08-16 ENCOUNTER — Other Ambulatory Visit (HOSPITAL_COMMUNITY): Payer: Self-pay

## 2022-08-16 DIAGNOSIS — G9340 Encephalopathy, unspecified: Secondary | ICD-10-CM | POA: Diagnosis not present

## 2022-08-16 LAB — RAD ONC ARIA SESSION SUMMARY
Course Elapsed Days: 6
Plan Fractions Treated to Date: 4
Plan Prescribed Dose Per Fraction: 3 Gy
Plan Total Fractions Prescribed: 10
Plan Total Prescribed Dose: 30 Gy
Reference Point Dosage Given to Date: 12 Gy
Reference Point Session Dosage Given: 3 Gy
Session Number: 5

## 2022-08-16 MED ORDER — FOLIC ACID 1 MG PO TABS
1.0000 mg | ORAL_TABLET | Freq: Every day | ORAL | 0 refills | Status: DC
  Filled 2022-08-16: qty 30, 30d supply, fill #0

## 2022-08-16 MED ORDER — ONDANSETRON 8 MG PO TBDP
8.0000 mg | ORAL_TABLET | Freq: Three times a day (TID) | ORAL | 0 refills | Status: DC | PRN
  Filled 2022-08-16: qty 20, 7d supply, fill #0

## 2022-08-16 MED ORDER — VITAMIN B-1 100 MG PO TABS
100.0000 mg | ORAL_TABLET | Freq: Every day | ORAL | 0 refills | Status: DC
  Filled 2022-08-16 – 2022-09-08 (×4): qty 100, 100d supply, fill #0

## 2022-08-16 MED ORDER — SENNOSIDES-DOCUSATE SODIUM 8.6-50 MG PO TABS
1.0000 | ORAL_TABLET | Freq: Two times a day (BID) | ORAL | 0 refills | Status: DC
  Filled 2022-08-16 – 2022-09-08 (×4): qty 60, 30d supply, fill #0

## 2022-08-16 MED ORDER — NICOTINE 21 MG/24HR TD PT24
21.0000 mg | MEDICATED_PATCH | Freq: Every day | TRANSDERMAL | 0 refills | Status: DC
  Filled 2022-08-16: qty 28, 28d supply, fill #0

## 2022-08-16 MED ORDER — MEGESTROL ACETATE 40 MG PO TABS
160.0000 mg | ORAL_TABLET | Freq: Every day | ORAL | 0 refills | Status: DC
  Filled 2022-08-16: qty 30, 7d supply, fill #0

## 2022-08-16 MED ORDER — HYDROMORPHONE HCL 2 MG PO TABS
6.0000 mg | ORAL_TABLET | ORAL | 0 refills | Status: DC | PRN
  Filled 2022-08-16: qty 30, 5d supply, fill #0

## 2022-08-16 MED ORDER — DEXAMETHASONE 4 MG PO TABS
4.0000 mg | ORAL_TABLET | Freq: Every day | ORAL | 0 refills | Status: DC
  Filled 2022-08-16: qty 30, 30d supply, fill #0

## 2022-08-16 MED ORDER — MORPHINE SULFATE ER 15 MG PO TBCR
15.0000 mg | EXTENDED_RELEASE_TABLET | Freq: Two times a day (BID) | ORAL | 0 refills | Status: DC
  Filled 2022-08-16: qty 6, 3d supply, fill #0

## 2022-08-16 MED ORDER — MORPHINE SULFATE ER 15 MG PO TBCR: 15.0000 mg | EXTENDED_RELEASE_TABLET | Freq: Two times a day (BID) | ORAL | Status: DC

## 2022-08-16 MED ORDER — OXYCODONE HCL ER 15 MG PO T12A
15.0000 mg | EXTENDED_RELEASE_TABLET | Freq: Three times a day (TID) | ORAL | 0 refills | Status: DC
  Filled 2022-08-16: qty 30, 10d supply, fill #0

## 2022-08-16 MED ORDER — POLYETHYLENE GLYCOL 3350 17 G PO PACK
17.0000 g | PACK | Freq: Two times a day (BID) | ORAL | 0 refills | Status: DC
  Filled 2022-08-16: qty 60, 30d supply, fill #0

## 2022-08-16 NOTE — Discharge Summary (Addendum)
Physician Discharge Summary  Natalie Hinton [id number] DOB: 07/03/1986 DOA: 08/07/2022  PCP: [name], MD  Admit date: 08/07/2022 Discharge date: 08/16/2022  Admitted From: Home Disposition:  Home  Discharge Condition:Stable CODE STATUS:FULL Diet recommendation: Regular   Brief/Interim Summary:  Patient is a 37 year old female with history of widespread metastatic cancer of unknown primary presented with altered mental status, visual hallucination, headache, abnormal eye movement.  On presentation, she was encephalopathic and was admitted for further evaluation.CTA head and neck showed severe narrowing of both ICA at the skull base, bilateral necrotic mediastinal LAD, numerous large bilateral pulmonary metastasis and partially necrotic right supraventricular lymph node.   MRI brain W WO contrast showed large sellar lesion extending into bilateral cavernous sinuses and sphenoid sinus resulting in narrowing of bilateral ICA. Neurology, oncology, and oncology and palliative medicine consulted.  Transferred to [facility] for palliative radiation.  Oncology recommended comfort care/hospice.  Radiation oncology recommending radiation treatment to the base of skull.  Palliative care consulted  for goals of care.  Plan is to remain as full code and continue treatment if possible.  Radiation oncology started radiation treatment.  Hospital course remarkable for persistent pain delaying discharge.  She feels better today, also had a bowel movement.  Medically stable for discharge to home today.  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  Brain imagings as above.  Ammonia, vitamin B12 level normal.  Has right lower extremity weakness.left facial droop.  Neurology signed off. EEG showed mild diffuse  encephalopathy but no seizure or epileptiform discharge.  She remains alert and oriented now   Metastatic cancer with unknown primary:  She had supraclavicular lymph node biopsy on 1/7.  Pathology showed poorly differentiated carcinoma. Widespread mets including lung, mediastinum, pelvis.  CA 19-19, CEA marked elevated.  Oncology was following and recommended comfort care/hospice.  Radiation oncology also following.  Radiation oncology  started radiation treatment to base of skull, can be continued   as an outpatient.   Intractable pain: Complains of pain mainly around the pelvic area radiating to the groin.Continues to complain of pain.  Long discussion had at the bedside about her pain management and the need of continue taking oral pain medications on discharge   Sinus tachycardia: This is also most likely triggered by pain.  Continue to monitor.  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  Patient also had a strokelike symptoms with abnormal movement, right lower extremity weakness all from metastatic effect   Severe protein calorie malnutrition: BMI of 13.6   Constipation: Continue aggressive bowel regimen.   Goals of care: Very poor prognosis on this young patient with widespread metastatic disease.  Patient is also severely malnourished.  Very deconditioned.  Oncology recommended comfort care.  Palliative care discussed with family about goals of care.  Patient and family wanted to continue full scope of treatment, radiation therapy.   Debility/deconditioning: PT/OT recommend home health.    Discharge Diagnoses:  Principal Problem:   Acute encephalopathy Active Problems:   Metastatic cancer (HCC)   Protein-calorie malnutrition, severe   Fever   Cancer related pain   Internal carotid artery stenosis, bilateral    Discharge Instructions  Discharge Instructions     Diet general   Complete by: As directed    Discharge instructions   Complete by:  As  directed    1)Please take prescribed medications as instructed 2)Follow up with palliative care .  You have  an appointment with Palliative care at 10 AM tomorrow at cancer center 3)Follow up with radiation oncology and oncology as an outpatient.   Increase activity slowly   Complete by: As directed       Allergies as of 08/16/2022       Reactions   Penicillins Hives   Gabapentin Other (See Comments)   hallucinations        Medication List     STOP taking these medications    omeprazole 40 MG capsule Commonly known as: PRILOSEC   Oxycodone HCl 10 MG Tabs   promethazine 12.5 MG suppository Commonly known as: PHENERGAN       TAKE these medications    dexamethasone 4 MG tablet Commonly known as: DECADRON Take 1 tablet (4 mg total) by mouth daily. Start taking on: February 13, 123456   folic acid 1 MG tablet Commonly known as: FOLVITE Take 1 tablet (1 mg total) by mouth daily. Start taking on: August 17, 2022   HYDROmorphone 2 MG tablet Commonly known as: DILAUDID Take 3 tablets (6 mg total) by mouth every 4 hours as needed for severe pain.   megestrol 40 MG tablet Commonly known as: MEGACE Take 4 tablets (160 mg total) by mouth daily. Start taking on: August 17, 2022   morphine 15 MG 12 hr tablet Commonly known as: MS CONTIN Take 1 tablet (15 mg total) by mouth every 12 (twelve) hours for 3 days.   nicotine 21 mg/24hr patch Commonly known as: NICODERM CQ - dosed in mg/24 hours Place 1 patch (21 mg total) onto the skin daily. Start taking on: August 17, 2022   ondansetron 8 MG disintegrating tablet Commonly known as: ZOFRAN-ODT Take 1 tablet by mouth every 8 hours as needed for nausea or vomiting.   polyethylene glycol 17 g packet Commonly known as: MIRALAX / GLYCOLAX Take 17 g by mouth 2 (two) times daily.   senna-docusate 8.6-50 MG tablet Commonly known as: Senokot-S Take 1 tablet by mouth 2 (two) times daily.   thiamine 100 MG  tablet Commonly known as: Vitamin B-1 Take 1 tablet (100 mg total) by mouth daily. Start taking on: August 17, 2022               Durable Medical Equipment  (From admission, onward)           Start     Ordered   08/12/22 1527  For home use only DME Hospital bed  Once       Question Answer Comment  Length of Need Lifetime   Bed type Semi-electric      08/12/22 1526   08/12/22 1526  For home use only DME Walker rolling  Once       Question Answer Comment  Walker: With [employer] Wheels   Patient needs a walker to treat with the following condition Balance disorder      08/12/22 1526   08/12/22 1319  For home use only DME 3 n 1  Once        08/12/22 1319            Follow-up Information     [facility] Follow up.   Specialty: Home Health Services Why: Your home health has been set up with [facility]. The office will call you with start of service information. If you have any questions  or concerns please call the number listed above. Contact information: [address] [place] [address] [phone]         [name], MD. Schedule an appointment as soon as possible for a visit in 1 week(s).   Specialty: Family Medicine Contact information: [place] [address] [phone]                Allergies  Allergen Reactions   Penicillins Hives   Gabapentin Other (See Comments)    hallucinations    Consultations: Palliative care, oncology   Procedures/Studies: MR BRAIN W WO CONTRAST  Addendum Date: 08/10/2022   ADDENDUM REPORT: 08/10/2022 08:33 ADDENDUM: Addendum performed for correction of impression typographical error. IMPRESSION: Large sellar lesion extending into the bilateral cavernous sinuses and sphenoid sinus, and resulting in narrowing of the bilateral internal carotid arteries, as described above. 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 Electronically Signed   By: Pedro Earls M.D.   On: 08/10/2022 08:33   Result Date: 08/10/2022 CLINICAL DATA:  Weight loss, and. EXAM: MRI HEAD WITHOUT AND WITH CONTRAST TECHNIQUE: Multiplanar, multiecho pulse sequences of the brain and surrounding structures were obtained without and with intravenous contrast. CONTRAST:  72m GADAVIST GADOBUTROL 1 MMOL/ML IV SOLN COMPARISON:  None Available. FINDINGS: Brain: Necrotic enhancing sellar lesion is seen extending into and expanding the cavernous sinuses, eroding the sellar floor and extending into the right sphenoid sinus, extending into the suprasellar cistern and along the pituitary stalk and infundibular recess. Extension into the bilateral cavernous sinuses results in severe narrowing of the internal carotid arteries. Thickened pituitary stalk impinges on the optic chiasm. The mass lesion measures roughly 3.7 x 1.7 x 1.5 cm. No other focus of abnormal contrast enhancement is seen. However, postcontrast images are severely degraded by motion. No acute infarct, acute hemorrhage, hydrocephalus or extra-axial collection. Vascular: Decrease caliber of the cavernous segment of the bilateral internal carotid arteries, as described above. Other major intracranial flow voids are maintained. Skull and upper cervical spine: No focal marrow lesion identified in the calvarium the and upper cervical spine. Please see above for skull base. Sinuses/Orbits: Tumor invasion and mucosal thickening of the right sphenoid sinus. Remainder of the paranasal sinuses and orbits are maintained. Other: None. IMPRESSION: Large intraparotid sellar lesion extending into the bilateral cavernous sinuses and sphenoid sinus, and resulting in narrowing of the bilateral internal carotid arteries, as described above. 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 Electronically Signed: By: KPedro EarlsM.D. On:  08/09/2022 12:22   MR TOTAL SPINE METS SCREENING  Result Date: 08/09/2022 CLINICAL DATA:  Leptomeningeal disease. EXAM: MRI TOTAL SPINE WITHOUT AND WITH CONTRAST TECHNIQUE: Multisequence MR imaging of the spine from the cervical spine to the sacrum was performed prior to and following IV contrast administration for evaluation of spinal metastatic disease. CONTRAST:  574mGADAVIST GADOBUTROL 1 MMOL/ML IV SOLN COMPARISON:  None Available. FINDINGS: MRI CERVICAL SPINE FINDINGS Alignment: Physiologic. Vertebrae: No fracture, evidence of discitis, or bone lesion. No abnormal contrast enhancement. Cord: Normal signal and morphology. No leptomeningeal enhancement. Posterior Fossa, vertebral arteries, paraspinal tissues: Negative. Enhancing mass lesion in the right supraclavicular region. Disc levels: Small posterior disc protrusion at C5 the causing small indentation of the thecal without high-grade stenosis. No significant degenerative changes elsewhere in the cervical spine. MRI THORACIC SPINE FINDINGS Alignment:  Physiologic. Vertebrae: No fracture, evidence of  discitis, or bone lesion. No abnormal contrast enhancement. Cord:  Normal signal and morphology. No leptomeningeal enhancement. Paraspinal and other soft tissues: Multiple pulmonary nodules as well as right hilar and mediastinal mass. Disc levels: No significant disc bulge or herniation, spinal canal or neural foraminal stenosis at any level. MRI LUMBAR SPINE FINDINGS Segmentation:  Standard. Alignment:  Physiologic. Vertebrae: Superior endplate edema at L2 without significant height loss and faint contrast enhancement. No associated soft tissue mass. Remainder of the lumbar vertebrae are unremarkable. Conus medullaris: Extends to the L1-L2 level and appears normal. No leptomeningeal enhancement. Paraspinal and other soft tissues: Large solid and cystic lesions within the pelvis, incompletely characterized. Disc levels: No significant disc bulge or herniation,  spinal canal or neural foraminal stenosis. IMPRESSION: 1. No evidence of leptomeningeal disease of the spine. 2. Superior endplate edema at L2 without significant height loss and faint contrast enhancement, favored to represent a small compression fracture. Electronically Signed   By: Pedro Earls M.D.   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 EXAM: MRI HEAD WITHOUT CONTRAST TECHNIQUE: Multiplanar, multiecho pulse sequences of the brain and surrounding structures were obtained without intravenous contrast. COMPARISON:  CT Angio 08/07/22 FINDINGS: Limitations: Assessment is markedly limited due to degree of motion artifact on the axial and sagittal T1 and T2 weighted sequences. Susceptibility weighted imaging was also not performed. Brain: There are small foci of increased signal on diffusion-weighted imaging in the periatrial white matter on the right (series 2, image 30) and in the right parietal lobe (series 2, image 30). These have correlates on the ADC map, and are suspicious for sites of small acute infarcts. Susceptibility weighted imaging was not performed, which limits the ability to assess for intracranial hemorrhage. Within this limitation, there is no evidence of hemorrhage. Vascular: See prior CT head and neck angiogram. Skull and upper cervical spine: Normal marrow signal. On the sagittal T1 weighted sequences, which are degraded by motion, there is an apparent linear T1 hyperintense lesion within the cervical spinal cord. This is most likely artifactual in the setting of severe motion artifact. Sinuses/Orbits: Mucosal thickening bilateral sphenoid sinuses. 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 Other: None IMPRESSION: 1. Assessment is markedly limited due to degree of motion artifact on the axial and sagittal T1 and T2  weighted sequences. Susceptibility weighted imaging was also not performed. Within this limitation, there are small foci of increased signal on diffusion-weighted imaging in the periatrial white matter on the right and in the right parietal lobe, suspicious for sites of small acute infarcts. 2. Apparent linear T1 hyperintense lesion within the cervical spinal cord is most likely artifactual in the setting of severe motion artifact. Electronically Signed   By: Marin Roberts M.D.   On: 08/08/2022 15:09   EEG adult  Result Date: 08/08/2022 Lora Havens, MD     08/08/2022  8:23 AM Patient Name: Tyshell M Fazzini MRN: [id number] Epilepsy Attending: Lora Havens Referring Physician/Provider: Lorenza Chick, MD Date: 08/08/2022 Duration: 28.23 mins Patient history: 37yo F with ams. EEG to evaluate for seizure Level of alertness: Awake, asleep AEDs during EEG study: None Technical aspects: This EEG study was done with scalp electrodes positioned according to the 10-20 International system of electrode placement. Electrical activity was reviewed with band pass filter of 1-70Hz$ , sensitivity of 7 uV/mm, display speed of 58m/sec with a  $60Hzg$  notched filter applied as appropriate. EEG data were recorded continuously and digitally stored.  Video monitoring was available and reviewed as appropriate. Description: The posterior dominant rhythm consists of 7.5 Hz activity of moderate voltage (25-35 uV) seen predominantly in posterior head regions, symmetric and reactive to eye opening and eye closing. Sleep was characterized by vertex waves, sleep spindles (12 to 14 Hz), maximal frontocentral region. EEG showed intermittent generalized 3 to 6 Hz theta-delta slowing. Physiologic photic driving was not seen during photic stimulation. Hyperventilation was not performed.   ABNORMALITY - Intermittent slow, generalized IMPRESSION: This study is suggestive of mild diffuse encephalopathy, nonspecific etiology. No seizures or  epileptiform discharges were seen throughout the recording. Lora Havens   CT ANGIO HEAD NECK W WO CM  Result Date: 08/07/2022 CLINICAL DATA:  Right-sided Horner syndrome EXAM: CT ANGIOGRAPHY HEAD AND NECK TECHNIQUE: Multidetector CT imaging of the head and neck was performed using the standard protocol during bolus administration of intravenous contrast. Multiplanar CT image reconstructions and MIPs were obtained to evaluate the vascular anatomy. Carotid stenosis measurements (when applicable) are obtained utilizing NASCET criteria, using the distal internal carotid diameter as the denominator. RADIATION DOSE REDUCTION: This exam was performed according to the departmental dose-optimization program which includes automated exposure control, adjustment of the mA and/or kV according to patient size and/or use of iterative reconstruction technique. CONTRAST:  15m OMNIPAQUE IOHEXOL 350 MG/ML SOLN COMPARISON:  None Available. FINDINGS: CT HEAD FINDINGS Brain: There is no mass, hemorrhage or extra-axial collection. The size and configuration of the ventricles and extra-axial CSF spaces are normal. There is no acute or chronic infarction. The brain parenchyma is normal. Skull: The visualized skull base, calvarium and extracranial soft tissues are normal. Sinuses/Orbits: No fluid levels or advanced mucosal thickening of the visualized paranasal sinuses. No mastoid or middle ear effusion. The orbits are normal. CTA NECK FINDINGS SKELETON: There is no bony spinal canal stenosis. No lytic or blastic lesion. OTHER NECK: Partially necrotic right supraclavicular node measures 2.3 cm (7:119). UPPER CHEST: Bulky necrotic mediastinal lymph nodes measuring up to 2.5 cm. Numerous large bilateral pulmonary metastases. AORTIC ARCH: There is no calcific atherosclerosis of the aortic arch. There is no aneurysm, dissection or hemodynamically significant stenosis of the visualized portion of the aorta. Conventional 3 vessel aortic  branching pattern. The visualized proximal subclavian arteries are widely patent. RIGHT CAROTID SYSTEM: Normal without aneurysm, dissection or stenosis. LEFT CAROTID SYSTEM: Normal without aneurysm, dissection or stenosis. VERTEBRAL ARTERIES: Left dominant configuration. Both origins are clearly patent. There is no dissection, occlusion or flow-limiting stenosis to the skull base (V1-V3 segments). CTA HEAD FINDINGS POSTERIOR CIRCULATION: --Vertebral arteries: Normal V4 segments. --Inferior cerebellar arteries: Normal. --Basilar artery: Normal. --Superior cerebellar arteries: Normal. --Posterior cerebral arteries (PCA): Normal. ANTERIOR CIRCULATION: --Intracranial internal carotid arteries: Severe narrowing of both internal carotid arteries at the skull base. --Anterior cerebral arteries (ACA): Normal. Hypoplastic left A1 segment, normal variant --Middle cerebral arteries (MCA): Normal. VENOUS SINUSES: As permitted by contrast timing, patent. ANATOMIC VARIANTS: None Review of the MIP images confirms the above findings. IMPRESSION: 1. 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 No proximal large vessel occlusion. 2. No cervical arterial occlusion or stenosis. 3. Bulky necrotic mediastinal lymphadenopathy and numerous large bilateral pulmonary metastases. 4. Partially necrotic right supraclavicular lymph node, likely metastatic. Electronically Signed   By: KUlyses JarredM.D.   On: 08/07/2022 22:37   DG Chest Portable 1 View  Result Date: 08/07/2022 CLINICAL DATA:  Horner syndrome EXAM: PORTABLE CHEST 1 VIEW COMPARISON:  07/07/2022 CT and chest x-ray. FINDINGS: Innumerable bilateral pulmonary nodules again noted, similar to prior study. Heart is normal size. No effusions. No acute bony abnormality. IMPRESSION: Innumerable pulmonary nodules, unchanged. Electronically Signed   By: Rolm Baptise M.D.   On: 08/07/2022 22:02   MM DIAG BREAST TOMO BILATERAL  Result Date: 07/29/2022 CLINICAL  DATA:  37 year old female with recent CT scan demonstrating widely metastatic disease with innumerable pulmonary nodules, supraclavicular and mediastinal lymphadenopathy, pelvic/presacral mass and osseous metastases. Core biopsy of a right supraclavicular node revealed a poorly differentiated carcinoma. EXAM: DIGITAL DIAGNOSTIC BILATERAL MAMMOGRAM WITH TOMOSYNTHESIS; ULTRASOUND RIGHT BREAST LIMITED TECHNIQUE: Bilateral digital diagnostic mammography and breast tomosynthesis was performed.; Targeted ultrasound examination of the right breast was performed COMPARISON:  None. ACR Breast Density Category d: The breast tissue is extremely dense, which lowers the sensitivity of mammography. FINDINGS: No suspicious masses or calcifications are seen in either breast. An initially questioned asymmetry seen in the right breast is felt to resolve on the additional imaging, however given extreme breast density ultrasound will be performed. Targeted ultrasound of the right breast was performed. No suspicious masses or any other worrisome abnormalities identified, only extremely dense fibroglandular tissue seen. IMPRESSION: No mammographic evidence of malignancy in either breast. RECOMMENDATION: 1. If there is concern for a primary breast malignancy, then further evaluation with breast MRI with contrast would be recommended. 2.  Screening mammogram in one year.(Code:SM-B-01Y) I have discussed the findings and recommendations with the patient. If applicable, a reminder letter will be sent to the patient regarding the next appointment. BI-RADS CATEGORY  1: Negative. Electronically Signed   By: Everlean Alstrom M.D.   On: 07/29/2022 16:58   US BREAST LTD UNI RIGHT INC AXILLA  Result Date: 07/29/2022 CLINICAL DATA:  37 year old female with recent CT scan demonstrating widely metastatic disease with innumerable pulmonary nodules, supraclavicular and mediastinal lymphadenopathy, pelvic/presacral mass and osseous metastases. Core  biopsy of a right supraclavicular node revealed a poorly differentiated carcinoma. EXAM: DIGITAL DIAGNOSTIC BILATERAL MAMMOGRAM WITH TOMOSYNTHESIS; ULTRASOUND RIGHT BREAST LIMITED TECHNIQUE: Bilateral digital diagnostic mammography and breast tomosynthesis was performed.; Targeted ultrasound examination of the right breast was performed COMPARISON:  None. ACR Breast Density Category d: The breast tissue is extremely dense, which lowers the sensitivity of mammography. FINDINGS: No suspicious masses or calcifications are seen in either breast. An initially questioned asymmetry seen in the right breast is felt to resolve on the additional imaging, however given extreme breast density ultrasound will be performed. Targeted ultrasound of the right breast was performed. No suspicious masses or any other worrisome abnormalities identified, only extremely dense fibroglandular tissue seen. IMPRESSION: No mammographic evidence of malignancy in either breast. RECOMMENDATION: 1. If there is concern for a primary breast malignancy, then further evaluation with breast MRI with contrast would be recommended. 2.  Screening mammogram in one year.(Code:SM-B-01Y) I have discussed the findings and recommendations with the patient. If applicable, a reminder letter will be sent to the patient regarding the next appointment. BI-RADS CATEGORY  1: Negative. Electronically Signed   By: Everlean Alstrom M.D.   On: 07/29/2022 16:58  [place] CORE BIOPSY (LYMPH NODES)  Result Date: 07/21/2022 INDICATION: 37 year old female with history of enlarged right cervical lymph node concerning for metastatic disease. EXAM: Ultrasound-guided core biopsy of cervical lymph node MEDICATIONS: None. ANESTHESIA/SEDATION: None. FLUOROSCOPY TIME:  None. COMPLICATIONS: None immediate. PROCEDURE: Informed written consent was obtained from the patient after a thorough discussion of the procedural  risks, benefits and alternatives. All questions were addressed. Maximal  Sterile Barrier Technique was utilized including caps, mask, sterile gowns, sterile gloves, sterile drape, hand hygiene and skin antiseptic. A timeout was performed prior to the initiation of the procedure. Preprocedure ultrasound evaluation of the right neck demonstrated a large, heterogeneously hypoechoic, irregularly shaped lymph node measuring approximately 3.1 x 2.5 cm. The procedure was planned. Subdermal Local anesthesia was provided at the planned needle entry site with 1% lidocaine. Under direct ultrasound visualization, a 17 gauge coaxial introducer needle was positioned in the periphery of the lymph node. This was followed by a total of 2, 18 gauge core biopsy samples which were placed in formalin. The needle was removed and hemostasis was achieved with brief manual compression. Postprocedure ultrasound evaluation demonstrated no evidence of surrounding hematoma or other complicating features. The patient tolerated the procedure well was discharged home in good condition. IMPRESSION: Technically successful ultrasound-guided core biopsy of abnormally enlarged right cervical lymph node. Ruthann Cancer, MD [facility] Radiology Electronically Signed   By: Ruthann Cancer M.D.   On: 07/21/2022 12:03   [place] FINE NEEDLE ASP 1ST LESION  Result Date: 07/21/2022 INDICATION: 37 year old female with history of large pelvic cystic mass presenting for aspiration. EXAM: Ultrasound-guided pelvic cyst aspiration MEDICATIONS: The patient is currently admitted to the hospital and receiving intravenous antibiotics. The antibiotics were administered within an appropriate time frame prior to the initiation of the procedure. ANESTHESIA/SEDATION: None. COMPLICATIONS: None immediate. PROCEDURE: Informed written consent was obtained from the patient after a thorough discussion of the procedural risks, benefits and alternatives. All questions were addressed. Maximal Sterile Barrier  Technique was utilized including caps, mask, sterile gowns, sterile gloves, sterile drape, hand hygiene and skin antiseptic. A timeout was performed prior to the initiation of the procedure. The left lower quadrant was prepped and draped in standard fashion. Preprocedure ultrasound evaluation demonstrated large simple appearing pelvic cystic mass. Subdermal Local anesthesia was provided at the planned needle entry site. Deeper local anesthetic was provided to the level of the peritoneum under direct ultrasound visualization. A 10 cm Yueh catheter was then inserted under direct ultrasound guidance into the fluid collection. Aspiration was performed which yielded approximately 900 mL of translucent, slightly straw-colored fluid. A sample was sent for cytology. The needle was removed and hemostasis was achieved with brief manual compression. Postprocedure imaging demonstrated near complete resolution of the cystic collection without complicating features. The patient tolerated the procedure well was discharged in good condition. IMPRESSION: Technically successful ultrasound-guided aspiration of pelvic cyst yielding approximately 900 mL of translucent, lightly straw-colored fluid. A sample was sent for cytology. Ruthann Cancer, MD [facility] Radiology Electronically Signed   By: Ruthann Cancer M.D.   On: 07/21/2022 11:48      Subjective: Patient seen and examined at bedside today.  Hemodynamically stable for discharge.  Discharge Exam: Vitals:   08/15/22 2040 08/16/22 0428  BP: 108/73 (!) 123/90  Pulse: 83 (!) 108  Resp: 20 16  Temp: 98.4 F (36.9 C) 98 F (36.7 C)  SpO2: 99% 96%   Vitals:   08/15/22 1330 08/15/22 1719 08/15/22 2040 08/16/22 0428  BP: 97/84 113/81 108/73 (!) 123/90  Pulse: (!) 110 88 83 (!) 108  Resp: 18 18 20 16  $ Temp: 98.3 F (36.8 C)  98.4 F (36.9 C) 98 F (36.7 C)  TempSrc: Oral  Oral Oral  SpO2: 96% 100% 99% 96%  Weight:  Height:        General: Pt is alert, awake, not in acute distress Cardiovascular: RRR, S1/S2 +, no rubs, no gallops Respiratory: CTA bilaterally, no wheezing, no rhonchi Abdominal: Soft, NT, ND, bowel sounds + Extremities: no edema, no cyanosis    The results of significant diagnostics from this hospitalization (including imaging, microbiology, ancillary and laboratory) are listed below for reference.     Microbiology: No results found for this or any previous visit (from the past 240 hour(s)).   Labs: BNP (last 3 results) No results for input(s): "BNP" in the last 8760 hours. Basic Metabolic Panel: Recent Labs  Lab 08/12/22 2249  NA 137  K 4.0  CL 91*  CO2 29  GLUCOSE 155*  BUN 9  CREATININE 0.57  CALCIUM 8.9   Liver Function Tests: No results for input(s): "AST", "ALT", "ALKPHOS", "BILITOT", "PROT", "ALBUMIN" in the last 168 hours. No results for input(s): "LIPASE", "AMYLASE" in the last 168 hours. No results for input(s): "AMMONIA" in the last 168 hours. CBC: Recent Labs  Lab 08/12/22 1222  WBC 13.6*  HGB 13.1  HCT 40.4  MCV 87.1  PLT 209   Cardiac Enzymes: No results for input(s): "CKTOTAL", "CKMB", "CKMBINDEX", "TROPONINI" in the last 168 hours. BNP: Invalid input(s): "POCBNP" CBG: No results for input(s): "GLUCAP" in the last 168 hours. D-Dimer No results for input(s): "DDIMER" in the last 72 hours. Hgb A1c No results for input(s): "HGBA1C" in the last 72 hours. Lipid Profile No results for input(s): "CHOL", "HDL", "LDLCALC", "TRIG", "CHOLHDL", "LDLDIRECT" in the last 72 hours. Thyroid function studies No results for input(s): "TSH", "T4TOTAL", "T3FREE", "THYROIDAB" in the last 72 hours.  Invalid input(s): "FREET3" Anemia work up No results for input(s): "VITAMINB12", "FOLATE", "FERRITIN", "TIBC", "IRON", "RETICCTPCT" in the last 72 hours. Urinalysis    Component Value Date/Time   COLORURINE AMBER BIOCHEMICALS MAY BE AFFECTED BY COLOR (A)  12/09/2007 1618   APPEARANCEUR CLEAR 12/09/2007 1618   LABSPEC 1.010 07/07/2022 1253   PHURINE 7.0 07/07/2022 [place] 07/07/2022 1253   HGBUR NEGATIVE 07/07/2022 1253   BILIRUBINUR NEGATIVE 07/07/2022 1253   KETONESUR NEGATIVE 07/07/2022 1253   PROTEINUR NEGATIVE 07/07/2022 1253   UROBILINOGEN 0.2 07/07/2022 1253   NITRITE NEGATIVE 07/07/2022 1253   LEUKOCYTESUR NEGATIVE 07/07/2022 1253   Sepsis Labs Recent Labs  Lab 08/12/22 1222  WBC 13.6*   Microbiology No results found for this or any previous visit (from the past 240 hour(s)).  Please note: You were cared for by a hospitalist during your hospital stay. Once you are discharged, your primary care physician will handle any further medical issues. Please note that NO REFILLS for any discharge medications will be authorized once you are discharged, as it is imperative that you return to your primary care physician (or establish a relationship with a primary care physician if you do not have one) for your post hospital discharge needs so that they can reassess your need for medications and monitor your lab values.    Time coordinating discharge: 40 minutes  SIGNED:   Shelly Coss, MD  [facility] 08/16/2022, 1:54 PM Pager [phone]  If 7PM-7AM, please contact night-coverage [email or web address] Password [email or web address]

## 2022-08-16 NOTE — Progress Notes (Signed)
Palliative Medicine Progress Note   Patient Name: Natalie Hinton       Date: 08/16/2022 DOB: 1985/09/01  Age: 37 y.o. MRN#: [id number] Attending Physician: Shelly Coss, MD Primary Care Physician: [name], MD Admit Date: 08/07/2022   HPI/Patient Profile: 37 y.o. 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  She presented to [facility] ED on 08/07/2022 with altered mental status and abnormal eye movements.   CTA head showed bulky necrotic mediastinal lymphadenopathy and numerous large bilateral pulmonary metastases as well as necrotic right supraclavicular lymph node. Admitted to [facility] service for further evaluation of acute metabolic encephalopathy.  Subsequent imaging ultimately revealed brain metastasis. She was transferred to [facility] on 2/5 to start radiation.    Palliative Medicine was consulted for pain management and goals of care in the setting of metastatic cancer.  Subjective: Awake alert, mother at bedside, dressed and ready for discharge Discussed with patient about pain medication  Discussed with mother about patient's upcoming appointment at [facility] with palliative care.       Goals of Care: D/C home today F/u PMT at [place] to go home towards the end of this hospitalization Patient wishes to continue radiation treatments and understands that that they are palliative in nature and that she has advanced cancer  Full code for now     Objective:  Physical Exam Vitals reviewed.  Constitutional:      General: She is not in acute distress.    Appearance: She is cachectic. She is ill-appearing.  Pulmonary:     Effort: Pulmonary effort is normal.  Neurological:     Mental Status: She is  alert.             Vital Signs: BP (!) 123/90 (BP Location: Left Arm)   Pulse (!) 108   Temp 98 F (36.7 C) (Oral)   Resp 16   Ht 5' (1.524 m)   Wt 31.8 kg   SpO2 96%   BMI 13.67 kg/m  SpO2: SpO2: 96 % O2 Device: O2 Device: Room Air O2 Flow Rate:     Palliative Medicine Assessment & Plan   Assessment: 37 year old female with widely metastatic cancer of unknown primary, admitted with AMS. Found to have brain metastasis.  - poor prognosis  overall - uncontrolled pain  - Complicated psychosocial situation - Temprance's family has expressed concern about her fiance's ability to care for her at home.   Recommendations/Plan: Continue full scope interventions Radiation therapy as scheduled PMT will continue to follow  Symptom Management: MS Contin 15 mg PO Q 12 hours.  Dilaudid 6 mg tablet every 4 hours Dexamethasone 4 mg daily Bowel regimen as ordered:    Primary decision maker: Patient currently seems to have some insight into her condition and can participate in [place] law, her health care proxy would be her mother followed by her sister  Madaline Brilliant to d/c whenever patient is medically ready. Anticipate discharge in am on 08-16-22.  Patient to see my colleague Ms Cousar NP in the palliative clinic at the [place] on  2/13 @10$  AM.  Code Status: Full   Thank you for allowing the Palliative Medicine Team to assist in the care of this patient.  Mod MDM   Loistine Chance, MD   Please contact Palliative Medicine Team phone at [phone] for questions and concerns.  For individual providers, please see AMION.

## 2022-08-16 NOTE — Progress Notes (Signed)
PT Cancellation Note  Patient Details Name: Dorea KIMERA PAPAGEORGIOU MRN: [id number] DOB: [birth year]   Cancelled Treatment:    Reason Eval/Treat Not Completed: Patient declined, patient reports that she just returned from radiation and is gong home today.  [place] Office [phone] Weekend [phone]  Claretha Cooper 08/16/2022, 10:21 AM

## 2022-08-16 NOTE — Progress Notes (Signed)
Occupational Therapy Treatment Patient Details Name: Natalie Hinton MRN: [id number] DOB: 04-Nov-1985 Today's Date: 08/16/2022   History of present illness 37yo female who presented to the ED with AMS and abnormal eye movements. 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 PMH anxiety, thyroid disease   OT comments  Patient progressing and showed improved ability to ambulate to bathroom, void and perform own hygiene with supervision only and use of 2WW, compared to previous session when pt required Min guard assist. Pt aniticpates discharge home today and so activity was kept short and light so pt not overly fatigued.  Pt also updated that she will NOT have to climb any stairs at home.  Patient remains limited by pain from cancer/mets, generalized weakness and decreased activity tolerance along with deficits noted below. Pt continues to demonstrate fair rehab potential and would benefit from continued skilled OT to increase safety and independence with ADLs and functional transfers to allow pt to return home safely and reduce caregiver burden and fall risk.    Recommendations for follow up therapy are one component of a multi-disciplinary discharge planning process, led by the attending physician.  Recommendations may be updated based on patient status, additional functional criteria and insurance authorization.    Follow Up Recommendations  Home health OT     Assistance Recommended at Discharge Frequent or constant Supervision/Assistance  Patient can return home with the following  A little help with walking and/or transfers;A little help with bathing/dressing/bathroom;Assistance with cooking/housework;Help with stairs or ramp for entrance;Assist for transportation   Equipment Recommendations  BSC/3in1    Recommendations for Other Services      Precautions / Restrictions  Precautions Precautions: Fall Precaution Comments: poor vision R eye/double vision, Restrictions Weight Bearing Restrictions: No       Mobility Bed Mobility Overal bed mobility: Needs Assistance Bed Mobility: Supine to Sit, Sit to Supine     Supine to sit: Min assist Sit to supine: Supervision        Transfers                         Balance Overall balance assessment: Mild deficits observed, not formally tested, History of Falls                                         ADL either performed or assessed with clinical judgement   ADL Overall ADL's : Needs assistance/impaired     Grooming: Sitting;Set up;Wash/dry face;Wash/dry hands;Bed level               Lower Body Dressing: Moderate assistance;Sit to/from stand Lower Body Dressing Details (indicate cue type and reason): To step into slippers at [employer] Transfer: Sales executive;Ambulation;Rolling walker (2 wheels);Grab bars   Toileting- Clothing Manipulation and Hygiene: Sit to/from stand;Supervision/safety       Functional mobility during ADLs: Supervision/safety;Minimal assistance;Rolling walker (2 wheels)      Extremity/Trunk Assessment              Vision   Additional Comments: Pt keeping RT eye closed throughout. Pt reports that her fiance has purchased an eye patch.  Advised pt on taking breaks from patch when at rest/in a static position as tolerated to prevent further decompensation of vision. Pt agreeable to this.   Perception  Praxis      Cognition Arousal/Alertness: Awake/alert Behavior During Therapy: WFL for tasks assessed/performed Overall Cognitive Status: Within Functional Limits for tasks assessed                                 General Comments: AxO x 3 pleasant, willing.  following all directions.        Exercises      Shoulder Instructions       General Comments      Pertinent Vitals/ Pain        Pain Assessment Pain Assessment: Faces Faces Pain Scale: Hurts a little bit Pain Location: lower back and pelvis with bed mobility. Pain Descriptors / Indicators: Discomfort, Grimacing, Guarding Pain Intervention(s): Limited activity within patient's tolerance, Repositioned  Home Living                                          Prior Functioning/Environment              Frequency  Min 2X/week        Progress Toward Goals  OT Goals(current goals can now be found in the care plan section)  Progress towards OT goals: Progressing toward goals  Acute Rehab OT Goals Patient Stated Goal: To go home today. OT Goal Formulation: With patient Time For Goal Achievement: 08/26/22 Potential to Achieve Goals: [place]  Plan Discharge plan remains appropriate    Co-evaluation                 AM-PAC OT "6 Clicks" Daily Activity     Outcome Measure   Help from another person eating meals?: A Little Help from another person taking care of personal grooming?: A Little Help from another person toileting, which includes using toliet, bedpan, or urinal?: A Little Help from another person bathing (including washing, rinsing, drying)?: A Little Help from another person to put on and taking off regular upper body clothing?: A Little Help from another person to put on and taking off regular lower body clothing?: A Lot 6 Click Score: 17    End of Session Equipment Utilized During Treatment: Rolling walker (2 wheels)  OT Visit Diagnosis: Muscle weakness (generalized) (M62.81);Pain   Activity Tolerance Patient limited by fatigue;Patient limited by pain   Patient Left in bed;with call bell/phone within reach;with family/visitor present   Nurse Communication          Time: 1026-1040 OT Time Calculation (min): 14 min  Charges: OT General Charges $OT Visit: 1 Visit OT Treatments $Self Care/Home Management : 8-22 mins  Anderson Malta, [place] Office: [phone] 08/16/2022  Julien Girt 08/16/2022, 10:48 AM

## 2022-08-17 ENCOUNTER — Telehealth: Payer: Self-pay

## 2022-08-17 ENCOUNTER — Inpatient Hospital Stay: Payer: BLUE CROSS/BLUE SHIELD | Attending: Physician Assistant | Admitting: Nurse Practitioner

## 2022-08-17 ENCOUNTER — Other Ambulatory Visit (HOSPITAL_COMMUNITY): Payer: Self-pay

## 2022-08-17 ENCOUNTER — Other Ambulatory Visit: Payer: Self-pay

## 2022-08-17 ENCOUNTER — Ambulatory Visit
Admission: RE | Admit: 2022-08-17 | Discharge: 2022-08-17 | Disposition: A | Discharge status: Home / Self Care | Payer: Medicaid Other | Source: Ambulatory Visit | Attending: Radiation Oncology | Admitting: Radiation Oncology

## 2022-08-17 ENCOUNTER — Other Ambulatory Visit: Payer: Self-pay | Admitting: Radiation Oncology

## 2022-08-17 ENCOUNTER — Encounter: Payer: Self-pay | Admitting: Nurse Practitioner

## 2022-08-17 DIAGNOSIS — R634 Abnormal weight loss: Secondary | ICD-10-CM

## 2022-08-17 DIAGNOSIS — C7801 Secondary malignant neoplasm of right lung: Secondary | ICD-10-CM | POA: Diagnosis not present

## 2022-08-17 DIAGNOSIS — G893 Neoplasm related pain (acute) (chronic): Secondary | ICD-10-CM | POA: Diagnosis not present

## 2022-08-17 DIAGNOSIS — C799 Secondary malignant neoplasm of unspecified site: Secondary | ICD-10-CM | POA: Diagnosis not present

## 2022-08-17 DIAGNOSIS — Z515 Encounter for palliative care: Secondary | ICD-10-CM | POA: Diagnosis not present

## 2022-08-17 DIAGNOSIS — Z7189 Other specified counseling: Secondary | ICD-10-CM

## 2022-08-17 DIAGNOSIS — C770 Secondary and unspecified malignant neoplasm of lymph nodes of head, face and neck: Secondary | ICD-10-CM | POA: Diagnosis not present

## 2022-08-17 DIAGNOSIS — R530 Neoplastic (malignant) related fatigue: Secondary | ICD-10-CM

## 2022-08-17 DIAGNOSIS — Z510 Encounter for antineoplastic radiation therapy: Secondary | ICD-10-CM | POA: Diagnosis present

## 2022-08-17 DIAGNOSIS — R630 Anorexia: Secondary | ICD-10-CM | POA: Diagnosis not present

## 2022-08-17 DIAGNOSIS — C801 Malignant (primary) neoplasm, unspecified: Secondary | ICD-10-CM | POA: Diagnosis not present

## 2022-08-17 DIAGNOSIS — C7951 Secondary malignant neoplasm of bone: Secondary | ICD-10-CM | POA: Diagnosis not present

## 2022-08-17 DIAGNOSIS — C7802 Secondary malignant neoplasm of left lung: Secondary | ICD-10-CM | POA: Diagnosis not present

## 2022-08-17 LAB — RAD ONC ARIA SESSION SUMMARY
Course Elapsed Days: 7
Plan Fractions Treated to Date: 5
Plan Prescribed Dose Per Fraction: 3 Gy
Plan Total Fractions Prescribed: 10
Plan Total Prescribed Dose: 30 Gy
Reference Point Dosage Given to Date: 15 Gy
Reference Point Session Dosage Given: 3 Gy
Session Number: 6

## 2022-08-17 MED ORDER — MORPHINE SULFATE ER 15 MG PO TBCR: 15.0000 mg | EXTENDED_RELEASE_TABLET | Freq: Two times a day (BID) | ORAL | 0 refills | Status: DC

## 2022-08-17 MED ORDER — HYDROMORPHONE HCL 2 MG PO TABS: 6.0000 mg | ORAL_TABLET | ORAL | 0 refills | Status: DC | PRN

## 2022-08-17 MED ORDER — FLUCONAZOLE 100 MG PO TABS: 100.0000 mg | ORAL_TABLET | Freq: Every day | ORAL | 0 refills | Status: DC

## 2022-08-17 MED ORDER — DEXAMETHASONE 4 MG PO TABS: 4.0000 mg | ORAL_TABLET | Freq: Two times a day (BID) | ORAL | 0 refills | Status: DC

## 2022-08-17 NOTE — Patient Instructions (Addendum)
-   eggcrate foam for bed might help with cushioning you  - continue your steroids as directed by your radiation oncologist, twice a day, oncer in the morning and once in the afternoon, do not take after 6pm as it can affect your sleep - continue to take your MS Contin evert 12 hours as prescribed, regardless of your pain level - continue your dilaudid every 4 hours just as you need it for breakthrough pain - do not go more than 1 day without a bowel movement, continue your stool softener and if you go without a bowel movement increase to twice a day - give [place] a 2-3 day call before you need a refill, we cannot do same day refills

## 2022-08-17 NOTE — Progress Notes (Signed)
[facility]  Telephone:([phone]) [phone] [phone]) [phone]   Name: Natalie Hinton Date: 08/17/2022 MRN: [id number]  DOB: 1986-01-07  Patient Care Team: [name], MD as PCP - General (Family Medicine) Bernadene Bell, MD as Consulting Physician (Gynecologic Oncology)    REASON FOR CONSULTATION: Natalie Hinton is a 37 y.o. 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 Now with large brain metastasis, currently undergoing radiation.  Palliative ask to see for symptom management and goals of care.    SOCIAL HISTORY:     reports that she has been smoking cigarettes. She started smoking about 20 years ago. She has a 10.00 pack-year smoking history. She has never used smokeless tobacco. She reports current drug use. Drug: Marijuana. She reports that she does not drink alcohol.  ADVANCE DIRECTIVES:  None on file  CODE STATUS: Full code  PAST MEDICAL HISTORY: Past Medical History:  Diagnosis Date   Allergy    Anxiety    Thyroid disease     PAST SURGICAL HISTORY: No past surgical history on file.  HEMATOLOGY/ONCOLOGY HISTORY:  Oncology History   No history exists.    ALLERGIES:  is allergic to penicillins and gabapentin.  MEDICATIONS:  Current Outpatient Medications  Medication Sig Dispense Refill   dexamethasone (DECADRON) 4 MG tablet Take 1 tablet (4 mg total) by mouth daily. 30 tablet 0   folic acid (FOLVITE) 1 MG tablet Take 1 tablet (1 mg total) by mouth daily. 30 tablet 0   HYDROmorphone (DILAUDID) 2 MG tablet Take 3 tablets (6 mg total) by mouth every 4 hours as needed for severe pain. 30 tablet 0   megestrol (MEGACE) 40 MG tablet Take 4 tablets (160 mg total) by mouth daily. 30 tablet 0   morphine (MS CONTIN) 15 MG 12 hr tablet Take 1 tablet (15 mg total) by mouth every 12 (twelve) hours for  3 days. 6 tablet 0   nicotine (NICODERM CQ - DOSED IN MG/24 HOURS) 21 mg/24hr patch Place 1 patch (21 mg total) onto the skin daily. 28 patch 0   ondansetron (ZOFRAN-ODT) 8 MG disintegrating tablet Take 1 tablet by mouth every 8 hours as needed for nausea or vomiting. 20 tablet 0   polyethylene glycol (MIRALAX / GLYCOLAX) 17 g packet Take 17 g by mouth 2 (two) times daily. 60 each 0   senna-docusate (SENOKOT-S) 8.6-50 MG tablet Take 1 tablet by mouth 2 (two) times daily. 60 tablet 0   thiamine (VITAMIN B-1) 100 MG tablet Take 1 tablet (100 mg total) by mouth daily. 30 tablet 0   No current facility-administered medications for this visit.    VITAL SIGNS: There were no vitals taken for this visit. There were no vitals filed for this visit.  Estimated body mass index is 13.67 kg/m as calculated from the following:   Height as of 08/07/22: 5' (1.524 m).   Weight as of 08/07/22: 70 lb (31.8 kg).  LABS: CBC:    Component Value Date/Time   WBC 13.6 (H) 08/12/2022 1222   HGB 13.1 08/12/2022 1222   HGB 14.8 07/14/2022 1420   HCT 40.4 08/12/2022 1222   PLT 209 08/12/2022 1222   PLT 405 (H) 07/14/2022 1420   MCV 87.1 08/12/2022 1222   NEUTROABS 12.8 (H) 07/14/2022 1420   LYMPHSABS 2.6 07/14/2022 1420   MONOABS 0.9 07/14/2022 1420   EOSABS 0.1 07/14/2022 1420  BASOSABS 0.0 07/14/2022 1420   Comprehensive Metabolic Panel:    Component Value Date/Time   NA 137 08/12/2022 2249   K 4.0 08/12/2022 2249   CL 91 (L) 08/12/2022 2249   CO2 29 08/12/2022 2249   BUN 9 08/12/2022 2249   CREATININE 0.57 08/12/2022 2249   CREATININE 0.49 07/14/2022 1420   GLUCOSE 155 (H) 08/12/2022 2249   CALCIUM 8.9 08/12/2022 2249   AST 37 08/07/2022 2123   AST 25 07/14/2022 1420   ALT 23 08/07/2022 2123   ALT 17 07/14/2022 1420   ALKPHOS 65 08/07/2022 2123   BILITOT 1.2 08/07/2022 2123   BILITOT <0.1 (L) 07/14/2022 1420   PROT 7.0 08/07/2022 2123   ALBUMIN 2.6 (L) 08/07/2022 2123    RADIOGRAPHIC  STUDIES: MR BRAIN W WO CONTRAST  Addendum Date: 08/10/2022   ADDENDUM REPORT: 08/10/2022 08:33 ADDENDUM: Addendum performed for correction of impression typographical error. IMPRESSION: Large sellar lesion extending into the bilateral cavernous sinuses and sphenoid sinus, and resulting in narrowing of the bilateral internal carotid arteries, as described above. 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 Electronically Signed   By: Pedro Earls M.D.   On: 08/10/2022 08:33   Result Date: 08/10/2022 CLINICAL DATA:  Weight loss, and. EXAM: MRI HEAD WITHOUT AND WITH CONTRAST TECHNIQUE: Multiplanar, multiecho pulse sequences of the brain and surrounding structures were obtained without and with intravenous contrast. CONTRAST:  15m GADAVIST GADOBUTROL 1 MMOL/ML IV SOLN COMPARISON:  None Available. FINDINGS: Brain: Necrotic enhancing sellar lesion is seen extending into and expanding the cavernous sinuses, eroding the sellar floor and extending into the right sphenoid sinus, extending into the suprasellar cistern and along the pituitary stalk and infundibular recess. Extension into the bilateral cavernous sinuses results in severe narrowing of the internal carotid arteries. Thickened pituitary stalk impinges on the optic chiasm. The mass lesion measures roughly 3.7 x 1.7 x 1.5 cm. No other focus of abnormal contrast enhancement is seen. However, postcontrast images are severely degraded by motion. No acute infarct, acute hemorrhage, hydrocephalus or extra-axial collection. Vascular: Decrease caliber of the cavernous segment of the bilateral internal carotid arteries, as described above. Other major intracranial flow voids are maintained. Skull and upper cervical spine: No focal marrow lesion identified in the calvarium the and upper cervical spine. Please see above for skull base. Sinuses/Orbits: Tumor invasion and mucosal thickening of the right  sphenoid sinus. Remainder of the paranasal sinuses and orbits are maintained. Other: None. IMPRESSION: Large intraparotid sellar lesion extending into the bilateral cavernous sinuses and sphenoid sinus, and resulting in narrowing of the bilateral internal carotid arteries, as described above. 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 Electronically Signed: By: KPedro EarlsM.D. On: 08/09/2022 12:22   MR TOTAL SPINE METS SCREENING  Result Date: 08/09/2022 CLINICAL DATA:  Leptomeningeal disease. EXAM: MRI TOTAL SPINE WITHOUT AND WITH CONTRAST TECHNIQUE: Multisequence MR imaging of the spine from the cervical spine to the sacrum was performed prior to and following IV contrast administration for evaluation of spinal metastatic disease. CONTRAST:  534mGADAVIST GADOBUTROL 1 MMOL/ML IV SOLN COMPARISON:  None Available. FINDINGS: MRI CERVICAL SPINE FINDINGS Alignment: Physiologic. Vertebrae: No fracture, evidence of discitis, or bone lesion. No abnormal contrast enhancement. Cord: Normal signal and morphology. No leptomeningeal enhancement. Posterior Fossa, vertebral arteries, paraspinal tissues: Negative. Enhancing mass lesion in the right supraclavicular region. Disc levels: Small posterior disc protrusion at C5 the  causing small indentation of the thecal without high-grade stenosis. No significant degenerative changes elsewhere in the cervical spine. MRI THORACIC SPINE FINDINGS Alignment:  Physiologic. Vertebrae: No fracture, evidence of discitis, or bone lesion. No abnormal contrast enhancement. Cord:  Normal signal and morphology. No leptomeningeal enhancement. Paraspinal and other soft tissues: Multiple pulmonary nodules as well as right hilar and mediastinal mass. Disc levels: No significant disc bulge or herniation, spinal canal or neural foraminal stenosis at any level. MRI LUMBAR SPINE FINDINGS Segmentation:  Standard. Alignment:   Physiologic. Vertebrae: Superior endplate edema at L2 without significant height loss and faint contrast enhancement. No associated soft tissue mass. Remainder of the lumbar vertebrae are unremarkable. Conus medullaris: Extends to the L1-L2 level and appears normal. No leptomeningeal enhancement. Paraspinal and other soft tissues: Large solid and cystic lesions within the pelvis, incompletely characterized. Disc levels: No significant disc bulge or herniation, spinal canal or neural foraminal stenosis. IMPRESSION: 1. No evidence of leptomeningeal disease of the spine. 2. Superior endplate edema at L2 without significant height loss and faint contrast enhancement, favored to represent a small compression fracture. Electronically Signed   By: Pedro Earls M.D.   On: 08/09/2022 12:44    PERFORMANCE STATUS (ECOG) : 2 - Symptomatic, <50% confined to bed  Review of Systems  Constitutional:  Positive for fatigue.  Eyes:  Positive for visual disturbance.       Right eye swelling and blurry vision.  Genitourinary:  Positive for pelvic pain.  Musculoskeletal:  Positive for back pain.   Unless otherwise noted, a complete review of systems is negative.  Physical Exam Constitutional:      Appearance: She is cachectic. She is ill-appearing.  Eyes:     Comments: Right eye swollen, closed. Reports double vision with manually opens lid   Pulmonary:     Effort: Pulmonary effort is normal.  Abdominal:     Comments: Last BM: 08/13/2022  Neurological:     Mental Status: She is alert.  Psychiatric:        Mood and Affect: Affect is labile.        Speech: Speech is delayed.     IMPRESSION: This is our initial visit with Ms. Stimmel at the cancer center. She was recently admitted to the hospital on 2/3/204 for abnormal eye movements and AMS. She received treatment for metabolic encephalopathy and diagnostic testing revealed metastasis of adenocarcinoma to lymph, lung, and brain. Radiation was  started on 2/5 and patient discharged yesterday, 2/12. Patient was referred to outpatient palliative care for the management of pain. Initially seen by our inpatient team who began pain management regimen.   Chart review prior to visit with patient. Present for this visit are patient's mother and cousin who provided input during visit. Clarified with patient who she would want to speak for her in the event she is unable to speak for herself. Patient states that she would want her significant other PeeWee Henryhand to be the first person contacted and her mother to be the second point of contact. Currently lives with [facility] and they have been together for the past 3-4 years.  Patient observed alert and oriented to all spheres. Responses delayed at times. Right eye is swollen and nearly closed; patient reports it has been swollen and vision blurry for several weeks. Affect is labile and observed anxious when discussing pain. Pain to pelvis and lower back that patient rates 8-10 at most times. After medication, decreases to "7.5." Reports that pain is  constant and endorses multiple pain descriptors: sharp, dull, aching, sore, throbbing. Denies shortness of breath. Reports good appetite and likes chocolate Ensure. Denies nausea and vomiting. Last BM 2/9 and patient reports that it was diarrhea. Feels that bowel pattern became irregular during recent hospitalization. Education provided on importance of bowel regimen in the setting of opioid use. Endorses fatigue and is working to pace activity.   Neoplasm related pain Pain is the symptom that patient is most concerned about. We reviewed her current medications. She is taking MS Contin 64m PO q 12hr and Dilaudid 635mPO q 4hr PRN for break-through pain. When awake, patient is taking Dilaudid q 4-5 hours. Reports that pain is unbearable if she goes much longer than th[place]ugene[place]house.itrovided on safe use of medications. Reviewed function of palliative care team, how to  contact, contacting when refills needed, and introduced pain medication contract. Understanding and relief verbalized by patient and family. Contract signed willingly.   [facility] had a visit with Radiation Oncology earlier today and Decadron has been increased to twice daily dosing. Patient confident that she remembers all recent medication changes and family expresses concern for a written plan so that they can help her. Education provided that a written discharge summary is provided at each patient visit that includes most current medication list.   Ms. MaMidgetteas been clear in her expressed wishes to continue to treat the treatable allowing her as much time to thrive. She is not interested in hospice discussions.   PLAN: Established therapeutic relationship. Education provided on palliative's role in collaboration with their Oncology/Radiation team. Education provided on pain medication regimen and pain medication contract completed. Continue MS Contin 1523mO q 12hr and Dilaudid 6mg63m q 4hr PRN for breakthrough pain. Refills have been sent to pharmacy.  Education provided to patient regarding taking Dexamethasone in morning and mid-afternoon to avoid insomnia. Reviewed expected benefits of steroid. Support increase by Oncology to BID. Monitor for impact on pain relief. Monitor bowel patterns. Continue Senokot and Miralax to prevent constipation. Reach out to dietician about helping patient get chocolate Ensure. Ongoing goals of care discussion and symptom management  Outpatient palliative team will plan to see patient back in 2-4 weeks in collaboration to other oncology appointments.   Signed by: AngeMoss Mc, MSN, CHPN-NP Student   I introduced myself, [place] RN, and Palliative's role in collaboration with the oncology team. Concept of Palliative Care was introduced as specialized medical care for people and their families living with serious illness.  It focuses on providing relief  from the symptoms and stress of a serious illness.  The goal is to improve quality of life for both the patient and the family. Values and goals of care important to patient and family were attempted to be elicited.   I discussed the importance of continued conversation with family and their medical providers regarding overall plan of care and treatment options, ensuring decisions are within the context of the patients values and GOCs.  I assessed patient alongside of AngeLevada Dy Student. Patient expressed understanding and was in agreement with above plan. She also understands that She can call the clinic at any time with any questions, concerns, or complaints.   Thank you for your referral and allowing Palliative to assist in Mrs. Safiatou M Lore's care.   Number and complexity of problems addressed: HIGH - 1 or more chronic illnesses with SEVERE exacerbation, progression, or side effects of treatment - advanced cancer, pain. Any controlled substances utilized were  prescribed in the context of palliative care.  Time Total: 55 min   Visit consisted of counseling and education dealing with the complex and emotionally intense issues of symptom management and palliative care in the setting of serious and potentially life-threatening illness.Greater than 50%  of this time was spent counseling and coordinating care related to the above assessment and plan.    Signed by: Alda Lea, AGPCNP-BC Palliative Medicine Team/[facility]

## 2022-08-17 NOTE — Transitions of Care (Post Inpatient/ED Visit) (Signed)
   08/17/2022  Name: Natalie Hinton MRN: [id number] DOB: 26-May-1986  Today's TOC FU Call Status: Today's TOC FU Call Status:: Unsuccessul Call (1st Attempt) Unsuccessful Call (1st Attempt) Date: 08/17/22  Attempted to reach the patient regarding the most recent Inpatient/ED visit.  Follow Up Plan: Additional outreach attempts will be made to reach the patient to complete the Transitions of Care (Post Inpatient/ED visit) call.   Signature Juanda Crumble, [place] Direct Dial [phone]

## 2022-08-18 ENCOUNTER — Other Ambulatory Visit: Payer: Self-pay

## 2022-08-18 ENCOUNTER — Inpatient Hospital Stay: Payer: BLUE CROSS/BLUE SHIELD | Admitting: Licensed Clinical Social Worker

## 2022-08-18 ENCOUNTER — Other Ambulatory Visit (HOSPITAL_COMMUNITY): Payer: Self-pay

## 2022-08-18 ENCOUNTER — Ambulatory Visit
Admission: RE | Admit: 2022-08-18 | Discharge: 2022-08-18 | Disposition: A | Discharge status: Home / Self Care | Payer: Medicaid Other | Source: Ambulatory Visit | Attending: Radiation Oncology | Admitting: Radiation Oncology

## 2022-08-18 ENCOUNTER — Inpatient Hospital Stay: Payer: BLUE CROSS/BLUE SHIELD | Admitting: Dietician

## 2022-08-18 DIAGNOSIS — C799 Secondary malignant neoplasm of unspecified site: Secondary | ICD-10-CM

## 2022-08-18 DIAGNOSIS — Z510 Encounter for antineoplastic radiation therapy: Secondary | ICD-10-CM | POA: Diagnosis not present

## 2022-08-18 DIAGNOSIS — G893 Neoplasm related pain (acute) (chronic): Secondary | ICD-10-CM

## 2022-08-18 DIAGNOSIS — Z515 Encounter for palliative care: Secondary | ICD-10-CM

## 2022-08-18 LAB — RAD ONC ARIA SESSION SUMMARY
Course Elapsed Days: 8
Plan Fractions Treated to Date: 6
Plan Prescribed Dose Per Fraction: 3 Gy
Plan Total Fractions Prescribed: 10
Plan Total Prescribed Dose: 30 Gy
Reference Point Dosage Given to Date: 18 Gy
Reference Point Session Dosage Given: 3 Gy
Session Number: 7

## 2022-08-18 MED ORDER — FLUCONAZOLE 100 MG PO TABS
100.0000 mg | ORAL_TABLET | Freq: Every day | ORAL | 0 refills | Status: DC
  Filled 2022-08-18: qty 7, 7d supply, fill #0

## 2022-08-18 MED ORDER — MORPHINE SULFATE ER 15 MG PO TBCR
15.0000 mg | EXTENDED_RELEASE_TABLET | Freq: Two times a day (BID) | ORAL | 0 refills | Status: AC
  Filled 2022-08-18: qty 54, 27d supply, fill #0

## 2022-08-18 MED ORDER — DEXAMETHASONE 4 MG PO TABS
4.0000 mg | ORAL_TABLET | Freq: Two times a day (BID) | ORAL | 0 refills | Status: DC
  Filled 2022-08-18: qty 30, 15d supply, fill #0

## 2022-08-18 MED ORDER — HYDROMORPHONE HCL 2 MG PO TABS
6.0000 mg | ORAL_TABLET | ORAL | 0 refills | Status: DC | PRN
  Filled 2022-08-18: qty 90, 15d supply, fill #0
  Filled 2022-08-20: qty 90, 5d supply, fill #0

## 2022-08-18 NOTE — Progress Notes (Signed)
[facility] Work  Initial Assessment   Arneisha NAKITA FODNESS is a 37 y.o. year old female accompanied by patient and fiance (PeeWee) . Clinical Social Work was referred by medical provider for assessment of psychosocial needs.   SDOH (Social Determinants of Health) assessments performed: Yes SDOH Interventions    Flowsheet Row Office Visit from 07/21/2022 in [facility]  SDOH Interventions   Depression Interventions/Treatment  Counseling       SDOH Screenings   Depression (PHQ2-9): High Risk (07/21/2022)  Tobacco Use: High Risk (08/17/2022)     Distress Screen completed: No     No data to display            Family/Social Information:  Housing Arrangement: patient lives with her fiance, PeeWee Family members/support persons in your life? Pt's mother resides close by as well as pt's sister and cousin.  Pt has a 8 year old son who resides w/ pt's mother.  According to pt her mother has had custody of her son since he was a baby.  Pt brought in a signed and notarized living will today designating Pee Wee HenryHands as her HCP. Transportation concerns: pt has been connected to the transportation department and is utilizing the service to get to and from treatment.  Employment: Unemployed .  Income source: Pt has not income at present, pt has been approved for Medicaid.  CSW encouraged pt to explore SSI.  Pt's fiance receives SSD due to an injury at work. Financial concerns: Yes, current concerns Type of concern: Utilities and Rent/ mortgage Food access concerns: no Religious or spiritual practice: Not known Services Currently in place:  none  Coping/ Adjustment to diagnosis: Patient understands treatment plan and what happens next? yes Concerns about diagnosis and/or treatment: Overwhelmed by information, Afraid of cancer, How I will pay for the services I need, and How will I care for myself Patient reported stressors: Finances, Depression, Anxiety/  nervousness, Adjusting to my illness, and continuing with treatment.  Pt very clearly states she would like to pursue any form of treatment that is available as long as possible. Hopes and/or priorities: Pt's priority is to engage in treatment w/ the hope of positive results. Patient enjoys  not addressed Current coping skills/ strengths: Motivation for treatment/growth  and Supportive family/friends     SUMMARY: Current SDOH Barriers:  Financial constraints related to lack of income, Family and relationship dysfunction, Cognitive Deficits, and Inability to perform ADL's independently  Clinical Social Work Clinical Goal(s):  Explore community resource options for unmet needs related to:  Financial Strain   Interventions: Discussed common feeling and emotions when being diagnosed with cancer, and the importance of support during treatment Informed patient of the support team roles and support services at [facility] Provided [facility] contact information and encouraged patient to call with any questions or concerns Referred patient to financial resource specialist to apply for [employer], provided w/ [employer], provided resources for pt's son for supportive services and copied pt's Living Will to be scanned to chart.   Follow Up Plan: Patient will contact CSW with any support or resource needs Patient verbalizes understanding of plan: Yes    Henriette Combs, LCSW   Patient is participating in a Managed Medicaid Plan:  Yes

## 2022-08-18 NOTE — Telephone Encounter (Signed)
Pt called reporting that she is unable to pick up her pain medications due to an insurance issue. This RN called [facility], pt preferred pharmacy, and confirmed that they do not take pt insurance. This RN then in turn called [facility] who reported that they do take pt insurance. This RN called pt and confirmed that she would like her medications to be moved to [facility], pt agreed. Please see new orders.

## 2022-08-18 NOTE — Progress Notes (Signed)
Nutrition Follow-up:  Patient with poorly differentiated adenocarcinoma metastatic to lungs, mediastinal lymph nodes, pelvis, and brain. She is currently receiving palliative radiation to base of skull under the care of Dr. Sondra Come  2/3-2/12 - admission with acute encephalopathy  Met with patient in office. She arrives from radiation in a wheelchair with her partner PeeWee. Patient in good spirits today. She reports appetite has been good. Patient recalls eating frequently through out the day. Her partner made goulash yesterday. This was delicious. Patient is currently not drinking Ensure. She is unable to tolerate the vanilla flavor. Patient has tried adding chocolate syrup and blending with ice cream. Patient denies nausea, vomiting, diarrhea. She is constipated. It has been 4 days since her last bowel movement. Patient is adhering to daily bowel regimen (miralax BID + stool softener).   Medications: dilaudid, megace, morphine, zofran, miralax, senokot, B1  Labs: 2/8 labs reviewed   Anthropometrics: Last wt 70 lbs on 2/3 (? Accuracy) Per chart pt 84 lb on 2/2 and 84 lb 2 oz on 1/29  NUTRITION DIAGNOSIS: Unintentional weight loss   INTERVENTION:  Encouraged high calorie high protein foods to promote weight gain - high calorie recipes given Encouraged 2-3 Ensure Plus HP/equivalent daily Provided one case Ensure Plus HP in preferred flavor   MONITORING, EVALUATION, GOAL: weight trends, intake   NEXT VISIT: To be scheduled in collaboration with other oncology appointments

## 2022-08-19 ENCOUNTER — Ambulatory Visit
Admission: RE | Admit: 2022-08-19 | Discharge: 2022-08-19 | Disposition: A | Discharge status: Home / Self Care | Payer: Medicaid Other | Source: Ambulatory Visit | Attending: Radiation Oncology | Admitting: Radiation Oncology

## 2022-08-19 ENCOUNTER — Other Ambulatory Visit (HOSPITAL_COMMUNITY): Payer: Self-pay

## 2022-08-19 ENCOUNTER — Other Ambulatory Visit: Payer: Self-pay

## 2022-08-19 DIAGNOSIS — Z510 Encounter for antineoplastic radiation therapy: Secondary | ICD-10-CM | POA: Diagnosis not present

## 2022-08-19 LAB — RAD ONC ARIA SESSION SUMMARY
Course Elapsed Days: 9
Plan Fractions Treated to Date: 7
Plan Prescribed Dose Per Fraction: 3 Gy
Plan Total Fractions Prescribed: 10
Plan Total Prescribed Dose: 30 Gy
Reference Point Dosage Given to Date: 21 Gy
Reference Point Session Dosage Given: 3 Gy
Session Number: 8

## 2022-08-19 NOTE — Transitions of Care (Post Inpatient/ED Visit) (Signed)
   08/19/2022  Name: Natalie Hinton MRN: [id number] DOB: [birth year]  Today's TOC FU Call Status: Today's TOC FU Call Status:: Unsuccessful Call (2nd Attempt) Unsuccessful Call (1st Attempt) Date: 08/17/22 Unsuccessful Call (2nd Attempt) Date: 08/19/22  Attempted to reach the patient regarding the most recent Inpatient/ED visit.  Follow Up Plan: Additional outreach attempts will be made to reach the patient to complete the Transitions of Care (Post Inpatient/ED visit) call.   Signature Juanda Crumble, [place] Direct Dial [phone]

## 2022-08-20 ENCOUNTER — Ambulatory Visit
Admission: RE | Admit: 2022-08-20 | Discharge: 2022-08-20 | Disposition: A | Discharge status: Home / Self Care | Payer: Medicaid Other | Source: Ambulatory Visit | Attending: Radiation Oncology | Admitting: Radiation Oncology

## 2022-08-20 ENCOUNTER — Other Ambulatory Visit (HOSPITAL_BASED_OUTPATIENT_CLINIC_OR_DEPARTMENT_OTHER): Payer: Self-pay

## 2022-08-20 ENCOUNTER — Other Ambulatory Visit: Payer: Self-pay

## 2022-08-20 ENCOUNTER — Other Ambulatory Visit (HOSPITAL_COMMUNITY): Payer: Self-pay

## 2022-08-20 DIAGNOSIS — Z510 Encounter for antineoplastic radiation therapy: Secondary | ICD-10-CM | POA: Diagnosis not present

## 2022-08-20 LAB — RAD ONC ARIA SESSION SUMMARY
Course Elapsed Days: 10
Plan Fractions Treated to Date: 8
Plan Prescribed Dose Per Fraction: 3 Gy
Plan Total Fractions Prescribed: 10
Plan Total Prescribed Dose: 30 Gy
Reference Point Dosage Given to Date: 24 Gy
Reference Point Session Dosage Given: 3 Gy
Session Number: 9

## 2022-08-23 ENCOUNTER — Ambulatory Visit
Admission: RE | Admit: 2022-08-23 | Discharge: 2022-08-23 | Disposition: A | Discharge status: Home / Self Care | Payer: Medicaid Other | Source: Ambulatory Visit | Attending: Radiation Oncology | Admitting: Radiation Oncology

## 2022-08-23 ENCOUNTER — Other Ambulatory Visit: Payer: Self-pay

## 2022-08-23 ENCOUNTER — Inpatient Hospital Stay: Payer: BLUE CROSS/BLUE SHIELD | Admitting: Nurse Practitioner

## 2022-08-23 DIAGNOSIS — Z510 Encounter for antineoplastic radiation therapy: Secondary | ICD-10-CM | POA: Diagnosis not present

## 2022-08-23 LAB — RAD ONC ARIA SESSION SUMMARY
Course Elapsed Days: 13
Plan Fractions Treated to Date: 9
Plan Prescribed Dose Per Fraction: 3 Gy
Plan Total Fractions Prescribed: 10
Plan Total Prescribed Dose: 30 Gy
Reference Point Dosage Given to Date: 27 Gy
Reference Point Session Dosage Given: 3 Gy
Session Number: 10

## 2022-08-23 NOTE — Progress Notes (Signed)
Pt arrived to radiation today, no-showed to palliative appt, was sent home by rad team

## 2022-08-24 ENCOUNTER — Other Ambulatory Visit (HOSPITAL_COMMUNITY): Payer: Self-pay

## 2022-08-24 ENCOUNTER — Telehealth: Payer: Self-pay | Admitting: Nurse Practitioner

## 2022-08-24 ENCOUNTER — Other Ambulatory Visit: Payer: Self-pay

## 2022-08-24 ENCOUNTER — Ambulatory Visit
Admission: RE | Admit: 2022-08-24 | Discharge: 2022-08-24 | Disposition: A | Discharge status: Home / Self Care | Payer: Medicaid Other | Source: Ambulatory Visit | Attending: Radiation Oncology | Admitting: Radiation Oncology

## 2022-08-24 ENCOUNTER — Telehealth: Payer: Self-pay

## 2022-08-24 DIAGNOSIS — Z510 Encounter for antineoplastic radiation therapy: Secondary | ICD-10-CM | POA: Diagnosis not present

## 2022-08-24 LAB — RAD ONC ARIA SESSION SUMMARY
Course Elapsed Days: 14
Plan Fractions Treated to Date: 10
Plan Prescribed Dose Per Fraction: 3 Gy
Plan Total Fractions Prescribed: 10
Plan Total Prescribed Dose: 30 Gy
Reference Point Dosage Given to Date: 30 Gy
Reference Point Session Dosage Given: 3 Gy
Session Number: 11

## 2022-08-24 MED ORDER — DEXAMETHASONE 4 MG PO TABS
ORAL_TABLET | ORAL | 0 refills | Status: DC
  Filled 2022-08-24 – 2022-09-05 (×2): qty 15, 22d supply, fill #0

## 2022-08-24 NOTE — Telephone Encounter (Signed)
Per 2/20 IB reached out to schedule patient, voicemail box is full.

## 2022-08-24 NOTE — Telephone Encounter (Signed)
Decadron taper as follows called in to [facility] per Dr. Sondra Come : Decadron 79m qd x7,  then 2546mqd x7, then 46m9mod x8 then Dc.

## 2022-08-25 ENCOUNTER — Encounter: Payer: Medicaid Other | Admitting: Internal Medicine

## 2022-08-25 ENCOUNTER — Other Ambulatory Visit (HOSPITAL_COMMUNITY): Payer: Self-pay

## 2022-08-26 NOTE — Radiation Completion Notes (Signed)
Patient Name: Natalie Hinton, Natalie Hinton MRN: [id number] Date of Birth: 26-Jul-1985 Referring Physician: Narda Rutherford, M.D. Date of Service: 2022-08-26 Radiation Oncologist: Teryl Lucy, M.D. [place] [facility] END OF TREATMENT NOTE     Diagnosis: C79.51 Secondary malignant neoplasm of bone Intent: Palliative     ==========DELIVERED PLANS==========  First Treatment Date: 2022-08-10 - Last Treatment Date: 2022-08-24   Plan Name: Pelvis_L_Pub Site: Pubis, Left Technique: 3D Mode: Photon Dose Per Fraction: 8 Gy Prescribed Dose (Delivered / Prescribed): 8 Gy / 8 Gy Prescribed Fxs (Delivered / Prescribed): 1 / 1   Plan Name: Brain Site: Cranium Technique: 3D Mode: Photon Dose Per Fraction: 3 Gy Prescribed Dose (Delivered / Prescribed): 30 Gy / 30 Gy Prescribed Fxs (Delivered / Prescribed): 10 / 10     ==========ON TREATMENT VISIT DATES========== 2022-08-10, 2022-08-17, 2022-08-24     ==========UPCOMING VISITS==========       ==========APPENDIX - ON TREATMENT VISIT NOTES==========   See weekly On Treatment Notes is Epic for details.

## 2022-08-28 ENCOUNTER — Other Ambulatory Visit: Payer: Self-pay | Admitting: Nurse Practitioner

## 2022-08-28 DIAGNOSIS — G893 Neoplasm related pain (acute) (chronic): Secondary | ICD-10-CM

## 2022-08-28 DIAGNOSIS — C799 Secondary malignant neoplasm of unspecified site: Secondary | ICD-10-CM

## 2022-08-28 DIAGNOSIS — Z515 Encounter for palliative care: Secondary | ICD-10-CM

## 2022-08-30 ENCOUNTER — Other Ambulatory Visit (HOSPITAL_COMMUNITY): Payer: Self-pay

## 2022-08-30 MED ORDER — HYDROMORPHONE HCL 2 MG PO TABS
6.0000 mg | ORAL_TABLET | ORAL | 0 refills | Status: DC | PRN
  Filled 2022-08-30: qty 90, 5d supply, fill #0

## 2022-08-31 ENCOUNTER — Other Ambulatory Visit: Payer: Self-pay

## 2022-08-31 ENCOUNTER — Other Ambulatory Visit (HOSPITAL_COMMUNITY): Payer: Self-pay

## 2022-09-01 ENCOUNTER — Other Ambulatory Visit: Payer: Self-pay | Admitting: Physician Assistant

## 2022-09-01 ENCOUNTER — Other Ambulatory Visit (HOSPITAL_COMMUNITY): Payer: Self-pay

## 2022-09-01 ENCOUNTER — Encounter: Payer: Self-pay | Admitting: Nurse Practitioner

## 2022-09-01 ENCOUNTER — Inpatient Hospital Stay (HOSPITAL_BASED_OUTPATIENT_CLINIC_OR_DEPARTMENT_OTHER): Payer: BLUE CROSS/BLUE SHIELD

## 2022-09-01 ENCOUNTER — Other Ambulatory Visit: Payer: Self-pay

## 2022-09-01 DIAGNOSIS — Z515 Encounter for palliative care: Secondary | ICD-10-CM

## 2022-09-01 DIAGNOSIS — G893 Neoplasm related pain (acute) (chronic): Secondary | ICD-10-CM

## 2022-09-01 DIAGNOSIS — C799 Secondary malignant neoplasm of unspecified site: Secondary | ICD-10-CM

## 2022-09-01 DIAGNOSIS — R530 Neoplastic (malignant) related fatigue: Secondary | ICD-10-CM

## 2022-09-01 DIAGNOSIS — R630 Anorexia: Secondary | ICD-10-CM

## 2022-09-01 MED ORDER — HYDROMORPHONE HCL 2 MG PO TABS: 6.0000 mg | ORAL_TABLET | ORAL | 0 refills | Status: DC | PRN

## 2022-09-01 NOTE — Progress Notes (Signed)
[place]  [phone] [phone]   Name: Natalie Hinton Date: 09/01/2022 MRN: [id number]  DOB: 12-Dec-1985  Patient Care Team: [name], MD as PCP - General (Family Medicine) Bernadene Bell, MD as Consulting Physician (Gynecologic Oncology)   I connected with Natalie Hinton on 09/01/22 at  9:00 AM EST by phone and verified that I am speaking with the correct person using two identifiers.   I discussed the limitations, risks, security and privacy concerns of performing an evaluation and management service by telemedicine and the availability of in-person appointments. I also discussed with the patient that there may be a patient responsible charge related to this service. The patient expressed understanding and agreed to proceed.   Other persons participating in the visit and their role in the encounter: Significant Other-Peewee  Patient's location: Home   Provider's location: [facility]   Chief Complaint: Symptom Management   INTERVAL HISTORY: Natalie Hinton is a 37 y.o. 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 Now with large brain metastasis, currently undergoing radiation.  Palliative ask to see for symptom management and goals of care.     SOCIAL HISTORY:     Hinton that she has been smoking cigarettes. She started smoking about 20 years ago. She has a 10.00 pack-year smoking history. She has never used smokeless tobacco. She Hinton current drug use. Drug: Marijuana. She Hinton that she does not drink alcohol.  ADVANCE DIRECTIVES:  Advanced directive on file  CODE STATUS:   PAST MEDICAL HISTORY: Past Medical History:  Diagnosis Date   Allergy    Anxiety    Thyroid disease     ALLERGIES:  is allergic to penicillins and gabapentin.  MEDICATIONS:  Current  Outpatient Medications  Medication Sig Dispense Refill   dexamethasone (DECADRON) 4 MG tablet Take 1 tablet (4 mg total) by mouth 2 (two) times daily. 30 tablet 0   dexamethasone (DECADRON) 4 MG tablet Take 1 tablet by mouth daily for 7 days, THEN 1/2 tablet daily for 7 days, THEN 1/2 tablet every other day for 8 days. 15 tablet 0   fluconazole (DIFLUCAN) 100 MG tablet Take 1 tablet (100 mg total) by mouth daily. 7 tablet 0   folic acid (FOLVITE) 1 MG tablet Take 1 tablet (1 mg total) by mouth daily. 30 tablet 0   HYDROmorphone (DILAUDID) 2 MG tablet Take 3 tablets (6 mg total) by mouth every 4 (four) hours as needed for severe pain. 90 tablet 0   megestrol (MEGACE) 40 MG tablet Take 4 tablets (160 mg total) by mouth daily. 30 tablet 0   morphine (MS CONTIN) 15 MG 12 hr tablet Take 1 tablet (15 mg total) by mouth every 12 (twelve) hours for 27 days. 54 tablet 0   nicotine (NICODERM CQ - DOSED IN MG/24 HOURS) 21 mg/24hr patch Place 1 patch (21 mg total) onto the skin daily. 28 patch 0   ondansetron (ZOFRAN-ODT) 8 MG disintegrating tablet Take 1 tablet by mouth every 8 hours as needed for nausea or vomiting. 20 tablet 0   polyethylene glycol (MIRALAX / GLYCOLAX) 17 g packet Take 17 g by mouth 2 (two) times daily. 60 each 0   senna-docusate (SENOKOT-S) 8.6-50 MG tablet Take 1 tablet by mouth 2 (two) times daily. 60 tablet 0   thiamine (VITAMIN B-1) 100 MG tablet Take 1 tablet (100 mg  total) by mouth daily. 30 tablet 0   No current facility-administered medications for this visit.    VITAL SIGNS: There were no vitals taken for this visit. There were no vitals filed for this visit.  Estimated body mass index is 13.67 kg/m as calculated from the following:   Height as of 08/07/22: 5' (1.524 m).   Weight as of 08/07/22: 70 lb (31.8 kg).   PERFORMANCE STATUS (ECOG) : 2 - Symptomatic, <50% confined to bed    IMPRESSION: I connected with Natalie Hinton via phone. She is complaining of pain.  Unfortunately her pharmacy did not have medication in stock and she has run out of her hydromorphone. Unable to come to physical appointment today due to pain. Denies nausea, vomiting, constipation, or diarrhea.   Neoplasm related pain Natalie Hinton pain is well controlled on current regimen. Some days are better than others.   We reviewed current regimen: MS Contin 15 mg every 12 hours and hydromorphone 6 mg every 4 hours as needed.  Patient Hinton she is having to take hydromorphone every 4-6 hours.  Pain significantly decreases when she takes medication.  Given appropriate response to current regimen no changes at this time.  We will continue to closely monitor.    Natalie Hinton has been clear in her expressed wishes to continue to treat the treatable allowing her as much time to thrive. She is not interested in hospice discussions.   I discussed the importance of continued conversation with family and their medical providers regarding overall plan of care and treatment options, ensuring decisions are within the context of the patients values and GOCs.   PLAN:  Continue MS Contin '15mg'$  PO q 12hr and Dilaudid '6mg'$  PO q 4hr PRN for breakthrough pain. Refills have been sent to pharmacy.  Monitor bowel patterns. Continue Senokot and Miralax to prevent constipation. Ongoing goals of care discussion and symptom management  Outpatient palliative team will plan to see patient back in 2-4 weeks in collaboration to other oncology appointments.   Patient expressed understanding and was in agreement with this plan. She also understands that She can call the clinic at any time with any questions, concerns, or complaints.   Any controlled substances utilized were prescribed in the context of palliative care. PDMP has been reviewed.   Time Total: 25 min   Visit consisted of counseling and education dealing with the complex and emotionally intense issues of symptom management and palliative care in the  setting of serious and potentially life-threatening illness.Greater than 50%  of this time was spent counseling and coordinating care related to the above assessment and plan.  Alda Lea, AGPCNP-BC  Palliative Medicine Team/[place]

## 2022-09-02 ENCOUNTER — Other Ambulatory Visit (HOSPITAL_COMMUNITY): Payer: Self-pay

## 2022-09-04 ENCOUNTER — Other Ambulatory Visit (HOSPITAL_COMMUNITY): Payer: Self-pay

## 2022-09-05 ENCOUNTER — Other Ambulatory Visit (HOSPITAL_COMMUNITY): Payer: Self-pay

## 2022-09-05 ENCOUNTER — Other Ambulatory Visit: Payer: Self-pay | Admitting: Nurse Practitioner

## 2022-09-05 ENCOUNTER — Other Ambulatory Visit: Payer: Self-pay

## 2022-09-05 DIAGNOSIS — G893 Neoplasm related pain (acute) (chronic): Secondary | ICD-10-CM

## 2022-09-05 DIAGNOSIS — Z515 Encounter for palliative care: Secondary | ICD-10-CM

## 2022-09-05 DIAGNOSIS — C799 Secondary malignant neoplasm of unspecified site: Secondary | ICD-10-CM

## 2022-09-07 ENCOUNTER — Other Ambulatory Visit (HOSPITAL_COMMUNITY): Payer: Self-pay

## 2022-09-08 ENCOUNTER — Other Ambulatory Visit (HOSPITAL_COMMUNITY): Payer: Self-pay

## 2022-09-08 ENCOUNTER — Inpatient Hospital Stay (HOSPITAL_COMMUNITY)
Admission: EM | Admit: 2022-09-08 | Discharge: 2022-09-12 | DRG: 947 | Disposition: A | Discharge status: Hospice | Payer: Medicaid Other | Attending: Internal Medicine | Admitting: Internal Medicine

## 2022-09-08 ENCOUNTER — Other Ambulatory Visit: Payer: Self-pay

## 2022-09-08 ENCOUNTER — Emergency Department (HOSPITAL_COMMUNITY): Payer: Medicaid Other

## 2022-09-08 ENCOUNTER — Other Ambulatory Visit: Payer: Self-pay | Admitting: Nurse Practitioner

## 2022-09-08 ENCOUNTER — Encounter (HOSPITAL_COMMUNITY): Payer: Self-pay

## 2022-09-08 DIAGNOSIS — E162 Hypoglycemia, unspecified: Secondary | ICD-10-CM | POA: Diagnosis present

## 2022-09-08 DIAGNOSIS — Z515 Encounter for palliative care: Secondary | ICD-10-CM | POA: Diagnosis not present

## 2022-09-08 DIAGNOSIS — R6510 Systemic inflammatory response syndrome (SIRS) of non-infectious origin without acute organ dysfunction: Secondary | ICD-10-CM | POA: Diagnosis present

## 2022-09-08 DIAGNOSIS — Z923 Personal history of irradiation: Secondary | ICD-10-CM

## 2022-09-08 DIAGNOSIS — E43 Unspecified severe protein-calorie malnutrition: Secondary | ICD-10-CM | POA: Diagnosis present

## 2022-09-08 DIAGNOSIS — R64 Cachexia: Secondary | ICD-10-CM | POA: Diagnosis present

## 2022-09-08 DIAGNOSIS — R300 Dysuria: Secondary | ICD-10-CM | POA: Diagnosis present

## 2022-09-08 DIAGNOSIS — H5789 Other specified disorders of eye and adnexa: Secondary | ICD-10-CM | POA: Diagnosis present

## 2022-09-08 DIAGNOSIS — C7989 Secondary malignant neoplasm of other specified sites: Secondary | ICD-10-CM | POA: Diagnosis present

## 2022-09-08 DIAGNOSIS — Z1152 Encounter for screening for COVID-19: Secondary | ICD-10-CM | POA: Diagnosis not present

## 2022-09-08 DIAGNOSIS — E8809 Other disorders of plasma-protein metabolism, not elsewhere classified: Secondary | ICD-10-CM | POA: Diagnosis present

## 2022-09-08 DIAGNOSIS — Z801 Family history of malignant neoplasm of trachea, bronchus and lung: Secondary | ICD-10-CM | POA: Diagnosis not present

## 2022-09-08 DIAGNOSIS — R1900 Intra-abdominal and pelvic swelling, mass and lump, unspecified site: Secondary | ICD-10-CM

## 2022-09-08 DIAGNOSIS — C799 Secondary malignant neoplasm of unspecified site: Secondary | ICD-10-CM

## 2022-09-08 DIAGNOSIS — R531 Weakness: Principal | ICD-10-CM

## 2022-09-08 DIAGNOSIS — Z681 Body mass index (BMI) 19 or less, adult: Secondary | ICD-10-CM | POA: Diagnosis not present

## 2022-09-08 DIAGNOSIS — Z66 Do not resuscitate: Secondary | ICD-10-CM | POA: Diagnosis present

## 2022-09-08 DIAGNOSIS — E872 Acidosis, unspecified: Secondary | ICD-10-CM | POA: Diagnosis present

## 2022-09-08 DIAGNOSIS — I959 Hypotension, unspecified: Secondary | ICD-10-CM | POA: Diagnosis present

## 2022-09-08 DIAGNOSIS — W19XXXA Unspecified fall, initial encounter: Secondary | ICD-10-CM | POA: Diagnosis not present

## 2022-09-08 DIAGNOSIS — E876 Hypokalemia: Secondary | ICD-10-CM | POA: Diagnosis present

## 2022-09-08 DIAGNOSIS — A419 Sepsis, unspecified organism: Principal | ICD-10-CM

## 2022-09-08 DIAGNOSIS — R9431 Abnormal electrocardiogram [ECG] [EKG]: Secondary | ICD-10-CM | POA: Diagnosis present

## 2022-09-08 DIAGNOSIS — E871 Hypo-osmolality and hyponatremia: Secondary | ICD-10-CM | POA: Diagnosis present

## 2022-09-08 DIAGNOSIS — C7931 Secondary malignant neoplasm of brain: Secondary | ICD-10-CM | POA: Diagnosis present

## 2022-09-08 DIAGNOSIS — R0902 Hypoxemia: Secondary | ICD-10-CM

## 2022-09-08 DIAGNOSIS — Y92009 Unspecified place in unspecified non-institutional (private) residence as the place of occurrence of the external cause: Secondary | ICD-10-CM

## 2022-09-08 DIAGNOSIS — C7800 Secondary malignant neoplasm of unspecified lung: Secondary | ICD-10-CM | POA: Diagnosis present

## 2022-09-08 DIAGNOSIS — E860 Dehydration: Secondary | ICD-10-CM | POA: Diagnosis present

## 2022-09-08 DIAGNOSIS — R059 Cough, unspecified: Secondary | ICD-10-CM | POA: Diagnosis present

## 2022-09-08 DIAGNOSIS — Z880 Allergy status to penicillin: Secondary | ICD-10-CM

## 2022-09-08 DIAGNOSIS — R54 Age-related physical debility: Secondary | ICD-10-CM | POA: Diagnosis present

## 2022-09-08 DIAGNOSIS — R0603 Acute respiratory distress: Secondary | ICD-10-CM | POA: Diagnosis present

## 2022-09-08 DIAGNOSIS — W1830XA Fall on same level, unspecified, initial encounter: Secondary | ICD-10-CM | POA: Diagnosis present

## 2022-09-08 DIAGNOSIS — J9601 Acute respiratory failure with hypoxia: Secondary | ICD-10-CM | POA: Diagnosis not present

## 2022-09-08 DIAGNOSIS — Z7189 Other specified counseling: Secondary | ICD-10-CM | POA: Diagnosis not present

## 2022-09-08 DIAGNOSIS — G893 Neoplasm related pain (acute) (chronic): Secondary | ICD-10-CM

## 2022-09-08 DIAGNOSIS — Z888 Allergy status to other drugs, medicaments and biological substances status: Secondary | ICD-10-CM

## 2022-09-08 DIAGNOSIS — F17210 Nicotine dependence, cigarettes, uncomplicated: Secondary | ICD-10-CM | POA: Diagnosis present

## 2022-09-08 DIAGNOSIS — E861 Hypovolemia: Secondary | ICD-10-CM | POA: Diagnosis present

## 2022-09-08 DIAGNOSIS — Z85830 Personal history of malignant neoplasm of bone: Secondary | ICD-10-CM | POA: Diagnosis not present

## 2022-09-08 DIAGNOSIS — R569 Unspecified convulsions: Secondary | ICD-10-CM

## 2022-09-08 HISTORY — DX: Secondary malignant neoplasm of unspecified site: C79.9

## 2022-09-08 LAB — CBC WITH DIFFERENTIAL/PLATELET
Abs Immature Granulocytes: 1.23 10*3/uL — ABNORMAL HIGH (ref 0.00–0.07)
Basophils Absolute: 0.1 10*3/uL (ref 0.0–0.1)
Basophils Relative: 0 %
Eosinophils Absolute: 0 10*3/uL (ref 0.0–0.5)
Eosinophils Relative: 0 %
HCT: 34.8 % — ABNORMAL LOW (ref 36.0–46.0)
Hemoglobin: 11.6 g/dL — ABNORMAL LOW (ref 12.0–15.0)
Immature Granulocytes: 3 %
Lymphocytes Relative: 6 %
Lymphs Abs: 2.5 10*3/uL (ref 0.7–4.0)
MCH: 28.3 pg (ref 26.0–34.0)
MCHC: 33.3 g/dL (ref 30.0–36.0)
MCV: 84.9 fL (ref 80.0–100.0)
Monocytes Absolute: 0.6 10*3/uL (ref 0.1–1.0)
Monocytes Relative: 2 %
Neutro Abs: 36.9 10*3/uL — ABNORMAL HIGH (ref 1.7–7.7)
Neutrophils Relative %: 89 %
Platelets: 305 10*3/uL (ref 150–400)
RBC: 4.1 MIL/uL (ref 3.87–5.11)
RDW: 17.5 % — ABNORMAL HIGH (ref 11.5–15.5)
WBC: 41.4 10*3/uL — ABNORMAL HIGH (ref 4.0–10.5)
nRBC: 0.3 % — ABNORMAL HIGH (ref 0.0–0.2)

## 2022-09-08 LAB — COMPREHENSIVE METABOLIC PANEL
ALT: 17 U/L (ref 0–44)
AST: 42 U/L — ABNORMAL HIGH (ref 15–41)
Albumin: 2.5 g/dL — ABNORMAL LOW (ref 3.5–5.0)
Alkaline Phosphatase: 216 U/L — ABNORMAL HIGH (ref 38–126)
Anion gap: 14 (ref 5–15)
BUN: 20 mg/dL (ref 6–20)
CO2: 27 mmol/L (ref 22–32)
Calcium: 9.7 mg/dL (ref 8.9–10.3)
Chloride: 85 mmol/L — ABNORMAL LOW (ref 98–111)
Creatinine, Ser: 0.79 mg/dL (ref 0.44–1.00)
GFR, Estimated: >60 mL/min (ref >60)
Glucose, Bld: 58 mg/dL — ABNORMAL LOW (ref 70–99)
Potassium: 3.5 mmol/L (ref 3.5–5.1)
Sodium: 126 mmol/L — ABNORMAL LOW (ref 135–145)
Total Bilirubin: 0.7 mg/dL (ref 0.3–1.2)
Total Protein: 6.4 g/dL — ABNORMAL LOW (ref 6.5–8.1)

## 2022-09-08 LAB — RESP PANEL BY RT-PCR (RSV, FLU A&B, COVID)  RVPGX2
Influenza A by PCR: NEGATIVE
Influenza B by PCR: NEGATIVE
Resp Syncytial Virus by PCR: NEGATIVE
SARS Coronavirus 2 by RT PCR: NEGATIVE

## 2022-09-08 LAB — PROTIME-INR
INR: 1.2 (ref 0.8–1.2)
Prothrombin Time: 15.2 seconds (ref 11.4–15.2)

## 2022-09-08 LAB — HCG, QUANTITATIVE, PREGNANCY: hCG, Beta Chain, Quant, S: 12 m[IU]/mL — ABNORMAL HIGH (ref <5)

## 2022-09-08 LAB — LACTIC ACID, PLASMA: Lactic Acid, Venous: 3.4 mmol/L (ref 0.5–1.9)

## 2022-09-08 LAB — CBG MONITORING, ED
Glucose-Capillary: 209 mg/dL — ABNORMAL HIGH (ref 70–99)
Glucose-Capillary: 58 mg/dL — ABNORMAL LOW (ref 70–99)

## 2022-09-08 LAB — APTT: aPTT: 37 seconds — ABNORMAL HIGH (ref 24–36)

## 2022-09-08 MED ORDER — DEXTROSE 50 % IV SOLN
1.0000 | Freq: Once | INTRAVENOUS | Status: AC
  Administered 2022-09-08: 50 mL via INTRAVENOUS

## 2022-09-08 MED ORDER — FENTANYL CITRATE PF 50 MCG/ML IJ SOSY
25.0000 ug | PREFILLED_SYRINGE | Freq: Once | INTRAMUSCULAR | Status: AC
  Administered 2022-09-08: 25 ug via INTRAVENOUS
  Filled 2022-09-08: qty 1

## 2022-09-08 MED ORDER — SODIUM CHLORIDE 0.9 % IV SOLN
2.0000 g | Freq: Once | INTRAVENOUS | Status: AC
  Administered 2022-09-08: 2 g via INTRAVENOUS
  Filled 2022-09-08: qty 10

## 2022-09-08 MED ORDER — SODIUM CHLORIDE 0.9 % IV SOLN: 30.0000 mg/kg | Freq: Once | INTRAVENOUS | Status: DC

## 2022-09-08 MED ORDER — ACETAMINOPHEN 325 MG PO TABS: 650.0000 mg | ORAL_TABLET | Freq: Four times a day (QID) | ORAL | Status: DC | PRN

## 2022-09-08 MED ORDER — ACETAMINOPHEN 650 MG RE SUPP: 650.0000 mg | Freq: Four times a day (QID) | RECTAL | Status: DC | PRN

## 2022-09-08 MED ORDER — FENTANYL CITRATE PF 50 MCG/ML IJ SOSY
50.0000 ug | PREFILLED_SYRINGE | Freq: Once | INTRAMUSCULAR | Status: AC
  Administered 2022-09-08: 50 ug via INTRAVENOUS
  Filled 2022-09-08: qty 1

## 2022-09-08 MED ORDER — METRONIDAZOLE 500 MG/100ML IV SOLN
500.0000 mg | Freq: Once | INTRAVENOUS | Status: AC
  Administered 2022-09-08: 500 mg via INTRAVENOUS
  Filled 2022-09-08: qty 100

## 2022-09-08 MED ORDER — LACTATED RINGERS IV SOLN: INTRAVENOUS | Status: DC

## 2022-09-08 MED ORDER — LACTATED RINGERS IV BOLUS
1000.0000 mL | Freq: Once | INTRAVENOUS | Status: AC
  Administered 2022-09-09: 1000 mL via INTRAVENOUS

## 2022-09-08 MED ORDER — LEVETIRACETAM IN NACL 1000 MG/100ML IV SOLN
1000.0000 mg | Freq: Once | INTRAVENOUS | Status: AC
  Administered 2022-09-08: 1000 mg via INTRAVENOUS
  Filled 2022-09-08: qty 100

## 2022-09-08 MED ORDER — LACTATED RINGERS IV BOLUS (SEPSIS)
250.0000 mL | Freq: Once | INTRAVENOUS | Status: AC
  Administered 2022-09-08: 250 mL via INTRAVENOUS

## 2022-09-08 MED ORDER — LACTATED RINGERS IV BOLUS (SEPSIS)
1000.0000 mL | Freq: Once | INTRAVENOUS | Status: AC
  Administered 2022-09-08: 1000 mL via INTRAVENOUS

## 2022-09-08 MED ORDER — VANCOMYCIN HCL IN DEXTROSE 1-5 GM/200ML-% IV SOLN
1000.0000 mg | Freq: Once | INTRAVENOUS | Status: AC
  Administered 2022-09-08: 1000 mg via INTRAVENOUS
  Filled 2022-09-08: qty 200

## 2022-09-08 MED ORDER — DEXTROSE 50 % IV SOLN
INTRAVENOUS | Status: AC
  Filled 2022-09-08: qty 50

## 2022-09-08 NOTE — ED Triage Notes (Signed)
BIBA from home c/o Code Sepsis. Pt has metastatic CA and was discharged from [facility] facility about a month ago with palliative care. Pt family called 80 tonight for some seizure like activity tonight. Pt endorses some syncopal type/falls today. EMS reports tachycardia, tachypnea and hypotension. CBG 80. EMS EKG shows sinus tachycardia. IV access was attempted without success per EMS. Pt alert and oriented upon arrival, endorses dizziness, SOB and generalized weakness.

## 2022-09-08 NOTE — Progress Notes (Signed)
A consult was received from an ED physician for vanco and aztreonam per pharmacy dosing.  The patient's profile has been reviewed for ht/wt/allergies/indication/available labs.   A one time order has been placed for aztreonam 2gm and vanc 1gm.    Further antibiotics/pharmacy consults should be ordered by admitting physician if indicated.                       Thank you, Dolly Rias RPh 09/08/2022, 10:21 PM

## 2022-09-08 NOTE — ED Provider Notes (Signed)
[facility] Provider Note   CSN: [id number] Arrival date & time: 09/08/22  2129     History  Chief Complaint  Patient presents with   Hypotension   Code Sepsis    Natalie Hinton is a 37 y.o. female.  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 Now with large brain metastasis, currently undergoing palliative radiation (last tx 08/26/22 per record, patient states not currently undergoing chemo or radiation). Patient reports able to drink not eating solids, dysuria, with diarrhea today. O2 sat 88% on arrival, has improved with [place]. Hypoglycemix with CBG 58, given 1 amp d50. Reports baseline cough, not worse than usual, no sick contacts.         Home Medications Prior to Admission medications   Medication Sig Start Date End Date Taking? Authorizing Provider  dexamethasone (DECADRON) 4 MG tablet Take 1 tablet (4 mg total) by mouth 2 (two) times daily. 08/18/22   Gery Pray, MD  dexamethasone (DECADRON) 4 MG tablet Take 1 tablet by mouth daily for 7 days, THEN 1/2 tablet daily for 7 days, THEN 1/2 tablet every other day for 8 days. 08/24/22 09/15/22  Gery Pray, MD  fluconazole (DIFLUCAN) 100 MG tablet Take 1 tablet (100 mg total) by mouth daily. 08/18/22   Gery Pray, MD  folic acid (FOLVITE) 1 MG tablet Take 1 tablet (1 mg total) by mouth daily. 08/17/22   Shelly Coss, MD  HYDROmorphone (DILAUDID) 2 MG tablet Take 3 tablets (6 mg total) by mouth every 4 (four) hours as needed for severe pain. 09/01/22   Pickenpack-Cousar, Carlena Sax, NP  megestrol (MEGACE) 40 MG tablet Take 4 tablets (160 mg total) by mouth daily. 08/17/22   Shelly Coss, MD  morphine (MS CONTIN) 15 MG 12 hr  tablet Take 1 tablet (15 mg total) by mouth every 12 (twelve) hours for 27 days. 08/18/22 09/14/22  Pickenpack-Cousar, Carlena Sax, NP  nicotine (NICODERM CQ - DOSED IN MG/24 HOURS) 21 mg/24hr patch Place 1 patch (21 mg total) onto the skin daily. 08/17/22   Shelly Coss, MD  ondansetron (ZOFRAN-ODT) 8 MG disintegrating tablet Take 1 tablet by mouth every 8 hours as needed for nausea or vomiting. 08/16/22   Shelly Coss, MD  polyethylene glycol (MIRALAX / GLYCOLAX) 17 g packet Take 17 g by mouth 2 (two) times daily. 08/16/22   Shelly Coss, MD  senna-docusate (SENOKOT-S) 8.6-50 MG tablet Take 1 tablet by mouth 2 (two) times daily. 08/16/22   Shelly Coss, MD  thiamine (VITAMIN B-1) 100 MG tablet Take 1 tablet (100 mg total) by mouth daily. 08/17/22   Shelly Coss, MD      Allergies    Penicillins and Gabapentin    Review of Systems   Review of Systems Negative except as per HPI Physical Exam Updated Vital Signs BP 103/66 (BP Location: Left Arm)   Pulse (!) 115   Temp 97.8 F (36.6 C) (Oral)   Resp 18   Ht 5' (1.524 m)   Wt 40.8 kg   SpO2 94%   BMI 17.58 kg/m  Physical Exam Vitals and nursing note reviewed.  Constitutional:      General: She is not in acute distress.    Appearance: She is well-developed. She is  cachectic. She is not diaphoretic.  HENT:     Head: Normocephalic and atraumatic.     Mouth/Throat:     Mouth: Mucous membranes are dry.  Eyes:     Comments: Right eye lids closed at rest, states this is new, unable to open right eye on her own. Right pupil appears smaller than left, both pupils round and reactive  Cardiovascular:     Rate and Rhythm: Regular rhythm. Tachycardia present.     Heart sounds: Normal heart sounds.  Pulmonary:     Effort: Tachypnea present.     Breath sounds: Examination of the right-lower field reveals decreased breath sounds. Examination of the left-lower field reveals decreased breath sounds. Decreased breath sounds present.   Abdominal:     Tenderness: There is no abdominal tenderness.  Musculoskeletal:     Right lower leg: No edema.     Left lower leg: No edema.  Skin:    General: Skin is warm and dry.     Findings: No erythema or rash.  Neurological:     Mental Status: She is alert and oriented to person, place, and time.     Cranial Nerves: Cranial nerve deficit and facial asymmetry present.     Comments: Globally weak, able to move all 4 extremities   Psychiatric:        Behavior: Behavior normal.     ED Results / Procedures / Treatments   Labs (all labs ordered are listed, but only abnormal results are displayed) Labs Reviewed  COMPREHENSIVE METABOLIC PANEL - Abnormal; Notable for the following components:      Result Value   Sodium 126 (*)    Chloride 85 (*)    Glucose, Bld 58 (*)    Total Protein 6.4 (*)    Albumin 2.5 (*)    AST 42 (*)    Alkaline Phosphatase 216 (*)    All other components within normal limits  LACTIC ACID, PLASMA - Abnormal; Notable for the following components:   Lactic Acid, Venous 3.4 (*)    All other components within normal limits  CBC WITH DIFFERENTIAL/PLATELET - Abnormal; Notable for the following components:   WBC 41.4 (*)    Hemoglobin 11.6 (*)    HCT 34.8 (*)    RDW 17.5 (*)    nRBC 0.3 (*)    Neutro Abs 36.9 (*)    Abs Immature Granulocytes 1.23 (*)    All other components within normal limits  HCG, QUANTITATIVE, PREGNANCY - Abnormal; Notable for the following components:   hCG, Beta Chain, Quant, S 12 (*)    All other components within normal limits  APTT - Abnormal; Notable for the following components:   aPTT 37 (*)    All other components within normal limits  CBG MONITORING, ED - Abnormal; Notable for the following components:   Glucose-Capillary 58 (*)    All other components within normal limits  CBG MONITORING, ED - Abnormal; Notable for the following components:   Glucose-Capillary 209 (*)    All other components within normal limits   RESP PANEL BY RT-PCR (RSV, FLU A&B, COVID)  RVPGX2  CULTURE, BLOOD (ROUTINE X 2)  CULTURE, BLOOD (ROUTINE X 2)  PROTIME-INR  LACTIC ACID, PLASMA  URINALYSIS, W/ REFLEX TO CULTURE (INFECTION SUSPECTED)    EKG EKG Interpretation  Date/Time:  Wednesday September 08 2022 21:40:59 EST Ventricular Rate:  125 PR Interval:  84 QRS Duration: 78 QT Interval:  392 QTC Calculation: 566 R Axis:   95  Text Interpretation: Sinus tachycardia Borderline right axis deviation Abnormal T, consider ischemia, diffuse leads Prolonged QT interval Since last tracing rate faster Confirmed by Wandra Arthurs [id number]) on 09/08/2022 10:16:43 PM  Radiology CT Head Wo Contrast  Result Date: 09/08/2022 CLINICAL DATA:  Known sellar mass. History of unspecified metastatic cancer. Seizure activity earlier today. Frequent falls. EXAM: CT HEAD WITHOUT CONTRAST TECHNIQUE: Contiguous axial images were obtained from the base of the skull through the vertex without intravenous contrast. RADIATION DOSE REDUCTION: This exam was performed according to the departmental dose-optimization program which includes automated exposure control, adjustment of the mA and/or kV according to patient size and/or use of iterative reconstruction technique. COMPARISON:  MRI without and with contrast 08/09/2022, CT without contrast 08/07/2022. FINDINGS: Brain: Again noted is a heterogeneous sellar mass, grossly unchanged in size estimated to measure 2.1 x 1.5 x 1.3 cm. There is again noted invasion of the cavernous sinuses, erosion through the sellar floor and protrusion into superior right sphenoid sinus. Findings of thickening of the overlap pituitary stalk and protrusion into the suprasellar cistern or better demonstrated on MRI. The cerebral and cerebellar hemispheres, midbrain and brainstem show no evidence of an acute infarct, hemorrhage or mass. The ventricles are normal in size and position. Basal cisterns are clear. Vascular: Narrowing due to mass effect  of both cavernous ICAs was noted on MRI but is not well seen on CT. There are no hyperdense central vessel. Skull: Negative for fractures or focal lesions. No scalp hematoma is evident. Sinuses/Orbits: Negative visualized orbits. There is interval increased opacification of the right sphenoid air cell underlying the invading tumor. This appears to be primarily due to fluid and membrane thickening with mild membrane disease in the ethmoid and left sphenoid sinuses. Other: None. IMPRESSION: 1. No acute intracranial CT findings or interval changes. 2. 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 3. Increased opacification of the right sphenoid air cell underlying the invading tumor. This appears to be primarily due to fluid and membrane thickening with mild membrane disease in the ethmoid and left sphenoid sinuses. 4. Narrowing due to mass effect of both cavernous ICAs was noted on MRI but is not well seen on CT. There are no hyperdense central vessels. Electronically Signed   By: Telford Nab M.D.   On: 09/08/2022 23:23   DG Chest Port 1 View  Result Date: 09/08/2022 CLINICAL DATA:  Seizure-like activity, sepsis, history of metastatic disease of unknown primary EXAM: PORTABLE CHEST 1 VIEW COMPARISON:  08/07/2022 FINDINGS: Scattered pulmonary nodules are again identified and stable. Cardiac shadow is within normal limits. Left lung is well aerated. Right lung shows elevation of the right hemidiaphragm new from the prior exam. No acute bony abnormality is noted. 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 New elevation of the right hemidiaphragm is noted. This may be related to the known mediastinal disease. Electronically Signed   By: Inez Catalina M.D.   On: 09/08/2022 22:26     Procedures .Critical Care  Performed by: Tacy Learn, PA-C Authorized by: Tacy Learn, PA-C   Critical care provider statement:    Critical care time (minutes):  30   Critical care was time spent personally by me on the following activities:  Development of treatment plan with patient or surrogate, discussions with consultants, evaluation of patient's  response to treatment, examination of patient, ordering and review of laboratory studies, ordering and review of radiographic studies, ordering and performing treatments and interventions, pulse oximetry, re-evaluation of patient's condition and review of old charts     Medications Ordered in ED Medications  lactated ringers infusion (has no administration in time range)  metroNIDAZOLE (FLAGYL) IVPB 500 mg (500 mg Intravenous New Bag/Given 09/08/22 2332)  vancomycin (VANCOCIN) IVPB 1000 mg/200 mL premix (1,000 mg Intravenous New Bag/Given 09/08/22 2333)  lactated ringers bolus 1,000 mL (has no administration in time range)  dextrose 50 % solution 50 mL (50 mLs Intravenous Given 09/08/22 2154)  lactated ringers bolus 1,000 mL (0 mLs Intravenous Stopped 09/08/22 2323)    And  lactated ringers bolus 250 mL (250 mLs Intravenous New Bag/Given 09/08/22 2333)  aztreonam (AZACTAM) 2 g in sodium chloride 0.9 % 100 mL IVPB (0 g Intravenous Stopped 09/08/22 2323)  levETIRAcetam (KEPPRA) IVPB 1000 mg/100 mL premix (0 mg Intravenous Stopped 09/08/22 2251)  fentaNYL (SUBLIMAZE) injection 25 mcg (25 mcg Intravenous Given 09/08/22 2230)  fentaNYL (SUBLIMAZE) injection 50 mcg (50 mcg Intravenous Given 09/08/22 2336)    ED Course/ Medical Decision Making/ A&P                             Medical Decision Making Amount and/or Complexity of Data Reviewed Labs: ordered. Radiology: ordered.  Risk Prescription drug management. Decision regarding hospitalization.   This patient presents to the ED for concern of weakness, new seizures, falls, dysuria,  diarrhea, this involves an extensive number of treatment options, and is a complaint that carries with it a high risk of complications and morbidity.  The differential diagnosis includes but not limited to sepsis, UTI, PNA, worsening metastatic process, metabolic derangement    Co morbidities that complicate the patient evaluation  Metastatic cancer   Additional history obtained:  Additional history obtained from mom and spouse at bedside who contribute to care as above External records from outside source obtained and reviewed including recent progress note with palliative medicine dated 09/01/2022, contacted via telephone   Lab Tests:  I Ordered, and personally interpreted labs.  The pertinent results include: RVP negative for COVID/flu/RSV.  CBC with WBC 41,000 with increase in neutrophils.  Lactic acid elevated at 3.4.  INR within normal notes.  CMP with sodium 126, glucose 58, treated and improved to 209. Urinalysis pending, patient has declined In-and-Out cath, has pure wick in place   Imaging Studies ordered:  I ordered imaging studies including chest x-ray, CT head I independently visualized and interpreted imaging which showed chest x-ray with metastatic process.  CT head with metastatic process I agree with the radiologist interpretation, head CT without significant change from prior   Cardiac Monitoring: / EKG:  The patient was maintained on a cardiac monitor.  I personally viewed and interpreted the cardiac monitored which showed an underlying rhythm of: Sinus tachycardia, rate 125   Consultations Obtained:  I requested consultation with the hospitalist, Dr. Velia Meyer,  and discussed lab and imaging findings as well as pertinent plan - they recommend: admission    Problem List / ED Course / Critical interventions / Medication management  37 year old female brought in by EMS in septic shock- hypotensive, tachycardic, hypoxic with above complaint and history. Found to  be hypoglycemic, improved with amp d50, hypoxia improved with [place] O2, BP improved with IVF per sepsis protocol. Broad spectrum abx on arrival, Keppra load for  concern for new seizure like activity as witnessed by family. WBC significantly elevated, CXR with metastatic process, head CT with metastatic process. UA pending collection (attempted in and out cath without success, staff reports difficult anatomy, patient declines further cath attempts, pur wick placed). Discussed goals of care with patient and her mom, patient deferred DNR decision making until her husband could be present. Husband arrived at bedside, Dr. Darl Householder discussed goals of care with patient who wishes to be DNR but accepts other forms of treatment. Patient admitted for further management and pain control (provided with fentanyl due to soft pressures).  I ordered medication including IVF, broad-spectrum antibiotics, fentanyl for sepsis, pain Reevaluation of the patient after these medicines showed that the patient stayed the same I have reviewed the patients home medicines and have made adjustments as needed   Social Determinants of Health:  Lives with family   Test / Admission - Considered:  Admit         Final Clinical Impression(s) / ED Diagnoses Final diagnoses:  Sepsis without acute organ dysfunction, due to unspecified organism [facility])  Metastatic adenocarcinoma ([place])  Seizure ([place])  Hyponatremia  Hypoxia    Rx / DC Orders ED Discharge Orders     None         Tacy Learn, PA-C 09/08/22 2354    Drenda Freeze, MD 09/09/22 [phone]

## 2022-09-08 NOTE — ED Notes (Signed)
Clicked the DNR by mistake

## 2022-09-08 NOTE — H&P (Signed)
History and Physical      Natalie Hinton [id number] DOB: [birth year] DOA: 09/08/2022  PCP: [name], MD *** Patient coming from: home ***  I have personally briefly reviewed patient's old medical records in [facility]  Chief Complaint: ***  HPI: Natalie [facility] is a 37 y.o. female with medical history significant for *** who is admitted to [facility] on 09/08/2022 with *** after presenting from home*** to [facility] complaining of ***.   ***        ***  ED Course:  Vital signs in the ED were notable for the following: ***  Labs were notable for the following: ***  Per my interpretation, EKG in ED demonstrated the following:  ***  Imaging and additional notable ED work-up: ***  While in the ED, the following were administered: ***  Subsequently, the patient was admitted  ***  ***red   Review of Systems: As per HPI otherwise 10 point review of systems negative.   Past Medical History:  Diagnosis Date   Allergy    Anxiety    Metastatic malignant neoplasm [facility])    Thyroid disease     History reviewed. No pertinent surgical history.  Social History:  reports that she has been smoking cigarettes. She started smoking about 20 years ago. She has a 10.00 pack-year smoking history. She has never used smokeless tobacco. She reports current drug use. Drug: Marijuana. She reports that she does not drink alcohol.   Allergies  Allergen Reactions   Penicillins Hives   Gabapentin Other (See Comments)    hallucinations    Family History  Problem Relation Age of Onset   Lung cancer Maternal Grandfather 38 - 69   Breast cancer Neg Hx    Ovarian cancer Neg Hx    Endometrial cancer Neg Hx    Prostate cancer Neg Hx    Pancreatic cancer Neg Hx    Colon cancer Neg Hx     Family history reviewed and not pertinent ***   Prior to Admission medications   Medication Sig Start Date End Date Taking? Authorizing Provider  dexamethasone (DECADRON) 4  MG tablet Take 1 tablet (4 mg total) by mouth 2 (two) times daily. 08/18/22   Gery Pray, MD  dexamethasone (DECADRON) 4 MG tablet Take 1 tablet by mouth daily for 7 days, THEN 1/2 tablet daily for 7 days, THEN 1/2 tablet every other day for 8 days. 08/24/22 09/15/22  Gery Pray, MD  fluconazole (DIFLUCAN) 100 MG tablet Take 1 tablet (100 mg total) by mouth daily. 08/18/22   Gery Pray, MD  folic acid (FOLVITE) 1 MG tablet Take 1 tablet (1 mg total) by mouth daily. 08/17/22   Shelly Coss, MD  HYDROmorphone (DILAUDID) 2 MG tablet Take 3 tablets (6 mg total) by mouth every 4 (four) hours as needed for severe pain. 09/01/22   Pickenpack-Cousar, Carlena Sax, NP  megestrol (MEGACE) 40 MG tablet Take 4 tablets (160 mg total) by mouth daily. 08/17/22   Shelly Coss, MD  morphine (MS CONTIN) 15 MG 12 hr tablet Take 1 tablet (15 mg total) by mouth every 12 (twelve) hours for 27 days. 08/18/22 09/14/22  Pickenpack-Cousar, Carlena Sax, NP  nicotine (NICODERM CQ - DOSED IN MG/24 HOURS) 21 mg/24hr patch Place 1 patch (21 mg total) onto the skin daily. 08/17/22   Shelly Coss, MD  ondansetron (ZOFRAN-ODT) 8 MG disintegrating tablet Take 1 tablet by mouth every 8 hours as needed for nausea or vomiting.  08/16/22   Shelly Coss, MD  polyethylene glycol (MIRALAX / GLYCOLAX) 17 g packet Take 17 g by mouth 2 (two) times daily. 08/16/22   Shelly Coss, MD  senna-docusate (SENOKOT-S) 8.6-50 MG tablet Take 1 tablet by mouth 2 (two) times daily. 08/16/22   Shelly Coss, MD  thiamine (VITAMIN B-1) 100 MG tablet Take 1 tablet (100 mg total) by mouth daily. 08/17/22   Shelly Coss, MD     Objective    Physical Exam: Vitals:   09/08/22 2200 09/08/22 2215 09/08/22 2230 09/08/22 2331  BP: (!) 85/61 104/80 105/82 103/66  Pulse: (!) 121 (!) 116 (!) 116 (!) 115  Resp: 17 (!) 25 (!) 28 18  Temp:      TempSrc:      SpO2: 91% 96% 99% 94%  Weight:      Height:        General: appears to be stated age; alert,  oriented Skin: warm, dry, no rash Head:  AT/[place] Mouth:  Oral mucosa membranes appear moist, normal dentition Neck: supple; trachea midline Heart:  RRR; did not appreciate any M/R/G Lungs: CTAB, did not appreciate any wheezes, rales, or rhonchi Abdomen: + BS; soft, ND, NT Vascular: 2+ pedal pulses b/l; 2+ radial pulses b/l Extremities: no peripheral edema, no muscle wasting Neuro: strength and sensation intact in upper and lower extremities b/l    *** Neuro: 5/5 strength of the proximal and distal flexors and extensors of the upper and lower extremities bilaterally; sensation intact in upper and lower extremities b/l; cranial nerves II through XII grossly intact; no pronator drift; no evidence suggestive of slurred speech, dysarthria, or facial droop; Normal muscle tone. No tremors. *** Neuro: In the setting of the patient's current mental status and associated inability to follow instructions, unable to perform full neurologic exam at this time.  As such, assessment of strength, sensation, and cranial nerves is limited at this time. Patient noted to spontaneously move all 4 extremities. No tremors.  ***    Labs on Admission: I have personally reviewed following labs and imaging studies  CBC: Recent Labs  Lab 09/08/22 2209  WBC 41.4*  NEUTROABS 36.9*  HGB 11.6*  HCT 34.8*  MCV 84.9  PLT 123456   Basic Metabolic Panel: Recent Labs  Lab 09/08/22 2209  NA 126*  K 3.5  CL 85*  CO2 27  GLUCOSE 58*  BUN 20  CREATININE 0.79  CALCIUM 9.7   GFR: Estimated Creatinine Clearance: 62.6 mL/min (by C-G formula based on SCr of 0.79 mg/dL). Liver Function Tests: Recent Labs  Lab 09/08/22 2209  AST 42*  ALT 17  ALKPHOS 216*  BILITOT 0.7  PROT 6.4*  ALBUMIN 2.5*   No results for input(s): "LIPASE", "AMYLASE" in the last 168 hours. No results for input(s): "AMMONIA" in the last 168 hours. Coagulation Profile: Recent Labs  Lab 09/08/22 2209  INR 1.2   Cardiac Enzymes: No  results for input(s): "CKTOTAL", "CKMB", "CKMBINDEX", "TROPONINI" in the last 168 hours. BNP (last 3 results) No results for input(s): "PROBNP" in the last 8760 hours. HbA1C: No results for input(s): "HGBA1C" in the last 72 hours. CBG: Recent Labs  Lab 09/08/22 2143 09/08/22 2213  GLUCAP 58* 209*   Lipid Profile: No results for input(s): "CHOL", "HDL", "LDLCALC", "TRIG", "CHOLHDL", "LDLDIRECT" in the last 72 hours. Thyroid Function Tests: No results for input(s): "TSH", "T4TOTAL", "FREET4", "T3FREE", "THYROIDAB" in the last 72 hours. Anemia Panel: No results for input(s): "VITAMINB12", "FOLATE", "FERRITIN", "TIBC", "IRON", "RETICCTPCT" in  the last 72 hours. Urine analysis:    Component Value Date/Time   COLORURINE AMBER BIOCHEMICALS MAY BE AFFECTED BY COLOR (A) 12/09/2007 1618   APPEARANCEUR CLEAR 12/09/2007 1618   LABSPEC 1.010 07/07/2022 1253   PHURINE 7.0 07/07/2022 [place] 07/07/2022 1253   HGBUR NEGATIVE 07/07/2022 1253   BILIRUBINUR NEGATIVE 07/07/2022 1253   KETONESUR NEGATIVE 07/07/2022 1253   PROTEINUR NEGATIVE 07/07/2022 1253   UROBILINOGEN 0.2 07/07/2022 1253   NITRITE NEGATIVE 07/07/2022 1253   LEUKOCYTESUR NEGATIVE 07/07/2022 1253    Radiological Exams on Admission: CT Head Wo Contrast  Result Date: 09/08/2022 CLINICAL DATA:  Known sellar mass. History of unspecified metastatic cancer. Seizure activity earlier today. Frequent falls. EXAM: CT HEAD WITHOUT CONTRAST TECHNIQUE: Contiguous axial images were obtained from the base of the skull through the vertex without intravenous contrast. RADIATION DOSE REDUCTION: This exam was performed according to the departmental dose-optimization program which includes automated exposure control, adjustment of the mA and/or kV according to patient size and/or use of iterative reconstruction technique. COMPARISON:  MRI without and with contrast 08/09/2022, CT without contrast 08/07/2022. FINDINGS: Brain: Again noted  is a heterogeneous sellar mass, grossly unchanged in size estimated to measure 2.1 x 1.5 x 1.3 cm. There is again noted invasion of the cavernous sinuses, erosion through the sellar floor and protrusion into superior right sphenoid sinus. Findings of thickening of the overlap pituitary stalk and protrusion into the suprasellar cistern or better demonstrated on MRI. The cerebral and cerebellar hemispheres, midbrain and brainstem show no evidence of an acute infarct, hemorrhage or mass. The ventricles are normal in size and position. Basal cisterns are clear. Vascular: Narrowing due to mass effect of both cavernous ICAs was noted on MRI but is not well seen on CT. There are no hyperdense central vessel. Skull: Negative for fractures or focal lesions. No scalp hematoma is evident. Sinuses/Orbits: Negative visualized orbits. There is interval increased opacification of the right sphenoid air cell underlying the invading tumor. This appears to be primarily due to fluid and membrane thickening with mild membrane disease in the ethmoid and left sphenoid sinuses. Other: None. IMPRESSION: 1. No acute intracranial CT findings or interval changes. 2. 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 3. Increased opacification of the right sphenoid air cell underlying the invading tumor. This appears to be primarily due to fluid and membrane thickening with mild membrane disease in the ethmoid and left sphenoid sinuses. 4. Narrowing due to mass effect of both cavernous ICAs was noted on MRI but is not well seen on CT. There are no hyperdense central vessels. Electronically Signed   By: Telford Nab M.D.   On: 09/08/2022 23:23   DG Chest Port 1 View  Result Date: 09/08/2022 CLINICAL DATA:  Seizure-like activity, sepsis, history of  metastatic disease of unknown primary EXAM: PORTABLE CHEST 1 VIEW COMPARISON:  08/07/2022 FINDINGS: Scattered pulmonary nodules are again identified and stable. Cardiac shadow is within normal limits. Left lung is well aerated. Right lung shows elevation of the right hemidiaphragm new from the prior exam. No acute bony abnormality is noted. 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 New elevation of the right hemidiaphragm is noted. This may be related to the known mediastinal disease. Electronically Signed   By: Inez Catalina M.D.   On: 09/08/2022  22:26      Assessment/Plan    Principal Problem:   Generalized weakness  ***      ***          ***           ***          ***          ***          ***          ***          ***          ***          ***          ***          ***          ***     DVT prophylaxis: SCD's ***  Code Status: Full code*** Family Communication: none*** Disposition Plan: Per Rounding Team Consults called: none***;  Admission status: ***    I SPENT GREATER THAN 75 *** MINUTES IN CLINICAL CARE TIME/MEDICAL DECISION-MAKING IN COMPLETING THIS ADMISSION.     [place] DO [facility] [place]   09/08/2022, 11:59 PM   ***

## 2022-09-09 ENCOUNTER — Encounter (HOSPITAL_COMMUNITY): Payer: Self-pay | Admitting: Internal Medicine

## 2022-09-09 ENCOUNTER — Inpatient Hospital Stay (HOSPITAL_COMMUNITY): Payer: Medicaid Other

## 2022-09-09 DIAGNOSIS — R6510 Systemic inflammatory response syndrome (SIRS) of non-infectious origin without acute organ dysfunction: Secondary | ICD-10-CM | POA: Insufficient documentation

## 2022-09-09 DIAGNOSIS — E871 Hypo-osmolality and hyponatremia: Secondary | ICD-10-CM | POA: Diagnosis present

## 2022-09-09 DIAGNOSIS — R9431 Abnormal electrocardiogram [ECG] [EKG]: Secondary | ICD-10-CM | POA: Insufficient documentation

## 2022-09-09 DIAGNOSIS — E43 Unspecified severe protein-calorie malnutrition: Secondary | ICD-10-CM

## 2022-09-09 DIAGNOSIS — C799 Secondary malignant neoplasm of unspecified site: Secondary | ICD-10-CM | POA: Diagnosis not present

## 2022-09-09 DIAGNOSIS — Y92009 Unspecified place in unspecified non-institutional (private) residence as the place of occurrence of the external cause: Secondary | ICD-10-CM

## 2022-09-09 DIAGNOSIS — Z515 Encounter for palliative care: Secondary | ICD-10-CM

## 2022-09-09 DIAGNOSIS — Z7189 Other specified counseling: Secondary | ICD-10-CM

## 2022-09-09 DIAGNOSIS — R531 Weakness: Secondary | ICD-10-CM | POA: Diagnosis not present

## 2022-09-09 DIAGNOSIS — E860 Dehydration: Secondary | ICD-10-CM | POA: Diagnosis present

## 2022-09-09 DIAGNOSIS — W19XXXA Unspecified fall, initial encounter: Secondary | ICD-10-CM

## 2022-09-09 DIAGNOSIS — J9601 Acute respiratory failure with hypoxia: Secondary | ICD-10-CM | POA: Diagnosis present

## 2022-09-09 DIAGNOSIS — E162 Hypoglycemia, unspecified: Secondary | ICD-10-CM | POA: Diagnosis present

## 2022-09-09 DIAGNOSIS — E872 Acidosis, unspecified: Secondary | ICD-10-CM | POA: Diagnosis present

## 2022-09-09 LAB — CBC WITH DIFFERENTIAL/PLATELET
Abs Immature Granulocytes: 0.91 10*3/uL — ABNORMAL HIGH (ref 0.00–0.07)
Basophils Absolute: 0.1 10*3/uL (ref 0.0–0.1)
Basophils Relative: 0 %
Eosinophils Absolute: 0 10*3/uL (ref 0.0–0.5)
Eosinophils Relative: 0 %
HCT: 28.9 % — ABNORMAL LOW (ref 36.0–46.0)
Hemoglobin: 9.5 g/dL — ABNORMAL LOW (ref 12.0–15.0)
Immature Granulocytes: 3 %
Lymphocytes Relative: 5 %
Lymphs Abs: 2 10*3/uL (ref 0.7–4.0)
MCH: 28.3 pg (ref 26.0–34.0)
MCHC: 32.9 g/dL (ref 30.0–36.0)
MCV: 86 fL (ref 80.0–100.0)
Monocytes Absolute: 0.6 10*3/uL (ref 0.1–1.0)
Monocytes Relative: 2 %
Neutro Abs: 33.5 10*3/uL — ABNORMAL HIGH (ref 1.7–7.7)
Neutrophils Relative %: 90 %
Platelets: 265 10*3/uL (ref 150–400)
RBC: 3.36 MIL/uL — ABNORMAL LOW (ref 3.87–5.11)
RDW: 18.1 % — ABNORMAL HIGH (ref 11.5–15.5)
WBC: 37.1 10*3/uL — ABNORMAL HIGH (ref 4.0–10.5)
nRBC: 0.1 % (ref 0.0–0.2)

## 2022-09-09 LAB — URINALYSIS, W/ REFLEX TO CULTURE (INFECTION SUSPECTED)
Bilirubin Urine: NEGATIVE
Glucose, UA: 50 mg/dL — AB
Hgb urine dipstick: NEGATIVE
Ketones, ur: NEGATIVE mg/dL
Nitrite: NEGATIVE
Protein, ur: NEGATIVE mg/dL
Specific Gravity, Urine: 1.002 — ABNORMAL LOW (ref 1.005–1.030)
pH: 5 (ref 5.0–8.0)

## 2022-09-09 LAB — COMPREHENSIVE METABOLIC PANEL
ALT: 14 U/L (ref 0–44)
AST: 44 U/L — ABNORMAL HIGH (ref 15–41)
Albumin: 2.2 g/dL — ABNORMAL LOW (ref 3.5–5.0)
Alkaline Phosphatase: 179 U/L — ABNORMAL HIGH (ref 38–126)
Anion gap: 14 (ref 5–15)
BUN: 17 mg/dL (ref 6–20)
CO2: 22 mmol/L (ref 22–32)
Calcium: 9 mg/dL (ref 8.9–10.3)
Chloride: 94 mmol/L — ABNORMAL LOW (ref 98–111)
Creatinine, Ser: 0.64 mg/dL (ref 0.44–1.00)
GFR, Estimated: >60 mL/min (ref >60)
Glucose, Bld: 55 mg/dL — ABNORMAL LOW (ref 70–99)
Potassium: 3.4 mmol/L — ABNORMAL LOW (ref 3.5–5.1)
Sodium: 130 mmol/L — ABNORMAL LOW (ref 135–145)
Total Bilirubin: 0.8 mg/dL (ref 0.3–1.2)
Total Protein: 5.7 g/dL — ABNORMAL LOW (ref 6.5–8.1)

## 2022-09-09 LAB — BRAIN NATRIURETIC PEPTIDE: B Natriuretic Peptide: 438.1 pg/mL — ABNORMAL HIGH (ref 0.0–100.0)

## 2022-09-09 LAB — SODIUM, URINE, RANDOM: Sodium, Ur: <10 mmol/L

## 2022-09-09 LAB — BLOOD GAS, VENOUS
Acid-Base Excess: 8.1 mmol/L — ABNORMAL HIGH (ref 0.0–2.0)
Bicarbonate: 29.1 mmol/L — ABNORMAL HIGH (ref 20.0–28.0)
O2 Saturation: 88.5 %
Patient temperature: 37.1
pCO2, Ven: 29 mmHg — ABNORMAL LOW (ref 44–60)
pH, Ven: 7.61 (ref 7.25–7.43)
pO2, Ven: 55 mmHg — ABNORMAL HIGH (ref 32–45)

## 2022-09-09 LAB — OSMOLALITY: Osmolality: 266 mOsm/kg — ABNORMAL LOW (ref 275–295)

## 2022-09-09 LAB — PREALBUMIN: Prealbumin: 8 mg/dL — ABNORMAL LOW (ref 18–38)

## 2022-09-09 LAB — PHOSPHORUS: Phosphorus: 4.5 mg/dL (ref 2.5–4.6)

## 2022-09-09 LAB — MAGNESIUM
Magnesium: 2 mg/dL (ref 1.7–2.4)
Magnesium: 1.9 mg/dL (ref 1.7–2.4)

## 2022-09-09 LAB — SALICYLATE LEVEL: Salicylate Lvl: <7 mg/dL — ABNORMAL LOW (ref 7.0–30.0)

## 2022-09-09 LAB — TSH: TSH: 0.621 u[IU]/mL (ref 0.350–4.500)

## 2022-09-09 LAB — LACTIC ACID, PLASMA: Lactic Acid, Venous: 2.9 mmol/L (ref 0.5–1.9)

## 2022-09-09 LAB — OSMOLALITY, URINE: Osmolality, Ur: 96 mOsm/kg — ABNORMAL LOW (ref 300–900)

## 2022-09-09 LAB — CREATININE, URINE, RANDOM: Creatinine, Urine: 15 mg/dL

## 2022-09-09 LAB — GLUCOSE, CAPILLARY
Glucose-Capillary: 80 mg/dL (ref 70–99)
Glucose-Capillary: 82 mg/dL (ref 70–99)
Glucose-Capillary: 93 mg/dL (ref 70–99)

## 2022-09-09 LAB — PROCALCITONIN: Procalcitonin: 8.05 ng/mL

## 2022-09-09 MED ORDER — ALBUMIN HUMAN 25 % IV SOLN
25.0000 g | Freq: Four times a day (QID) | INTRAVENOUS | Status: AC
  Administered 2022-09-09 – 2022-09-10 (×4): 25 g via INTRAVENOUS
  Filled 2022-09-09 (×6): qty 100

## 2022-09-09 MED ORDER — BIOTENE DRY MOUTH MT LIQD: 15.0000 mL | OROMUCOSAL | Status: DC | PRN

## 2022-09-09 MED ORDER — MEGESTROL ACETATE 40 MG PO TABS
160.0000 mg | ORAL_TABLET | Freq: Every day | ORAL | Status: DC
  Administered 2022-09-09 – 2022-09-12 (×4): 160 mg via ORAL
  Filled 2022-09-09 (×4): qty 4

## 2022-09-09 MED ORDER — NALOXONE HCL 0.4 MG/ML IJ SOLN: 0.4000 mg | INTRAMUSCULAR | Status: DC | PRN

## 2022-09-09 MED ORDER — LORAZEPAM 2 MG/ML IJ SOLN: 0.5000 mg | Freq: Four times a day (QID) | INTRAMUSCULAR | Status: DC | PRN

## 2022-09-09 MED ORDER — MORPHINE SULFATE ER 15 MG PO TBCR
15.0000 mg | EXTENDED_RELEASE_TABLET | Freq: Two times a day (BID) | ORAL | Status: DC
  Administered 2022-09-09 – 2022-09-12 (×7): 15 mg via ORAL
  Filled 2022-09-09 (×7): qty 1

## 2022-09-09 MED ORDER — POLYETHYLENE GLYCOL 3350 17 G PO PACK
17.0000 g | PACK | Freq: Two times a day (BID) | ORAL | Status: DC | PRN
  Filled 2022-09-09: qty 1

## 2022-09-09 MED ORDER — POTASSIUM CHLORIDE 10 MEQ/100ML IV SOLN
10.0000 meq | INTRAVENOUS | Status: AC
  Administered 2022-09-09 (×2): 10 meq via INTRAVENOUS
  Filled 2022-09-09 (×2): qty 100

## 2022-09-09 MED ORDER — HYDROMORPHONE HCL 1 MG/ML IJ SOLN
0.5000 mg | INTRAMUSCULAR | Status: DC | PRN
  Administered 2022-09-09 – 2022-09-10 (×7): 1 mg via INTRAVENOUS
  Filled 2022-09-09 (×7): qty 1

## 2022-09-09 MED ORDER — ENSURE ENLIVE PO LIQD
237.0000 mL | Freq: Three times a day (TID) | ORAL | Status: DC
  Administered 2022-09-09 – 2022-09-12 (×3): 237 mL via ORAL

## 2022-09-09 MED ORDER — GLYCOPYRROLATE 0.2 MG/ML IJ SOLN
0.2000 mg | INTRAMUSCULAR | Status: DC | PRN
  Administered 2022-09-09 – 2022-09-10 (×2): 0.2 mg via INTRAVENOUS
  Filled 2022-09-09 (×4): qty 1

## 2022-09-09 MED ORDER — ADULT MULTIVITAMIN W/MINERALS CH
1.0000 | ORAL_TABLET | Freq: Every day | ORAL | Status: DC
  Administered 2022-09-09 – 2022-09-12 (×4): 1 via ORAL
  Filled 2022-09-09 (×4): qty 1

## 2022-09-09 MED ORDER — FENTANYL CITRATE PF 50 MCG/ML IJ SOSY
50.0000 ug | PREFILLED_SYRINGE | INTRAMUSCULAR | Status: DC | PRN
  Administered 2022-09-09 (×2): 50 ug via INTRAVENOUS
  Filled 2022-09-09 (×2): qty 1

## 2022-09-09 MED ORDER — FENTANYL CITRATE PF 50 MCG/ML IJ SOSY: 25.0000 ug | PREFILLED_SYRINGE | INTRAMUSCULAR | Status: DC | PRN

## 2022-09-09 MED ORDER — HYDROMORPHONE HCL 2 MG PO TABS
6.0000 mg | ORAL_TABLET | ORAL | Status: DC | PRN
  Administered 2022-09-09 (×2): 6 mg via ORAL
  Filled 2022-09-09 (×2): qty 3

## 2022-09-09 MED ORDER — HYDROMORPHONE HCL 1 MG/ML IJ SOLN
0.5000 mg | Freq: Once | INTRAMUSCULAR | Status: AC
  Administered 2022-09-09: 0.5 mg via INTRAVENOUS
  Filled 2022-09-09: qty 0.5

## 2022-09-09 MED ORDER — DEXAMETHASONE 2 MG PO TABS
2.0000 mg | ORAL_TABLET | ORAL | Status: DC
  Administered 2022-09-09 – 2022-09-11 (×2): 2 mg via ORAL
  Filled 2022-09-09 (×3): qty 1

## 2022-09-09 MED ORDER — SODIUM CHLORIDE 0.9 % IV SOLN
1.0000 g | Freq: Three times a day (TID) | INTRAVENOUS | Status: DC
  Administered 2022-09-09 – 2022-09-11 (×5): 1 g via INTRAVENOUS
  Filled 2022-09-09 (×6): qty 5

## 2022-09-09 MED ORDER — SODIUM CHLORIDE 0.9 % IV SOLN
1.0000 g | INTRAVENOUS | Status: AC
  Administered 2022-09-09: 1 g via INTRAVENOUS
  Filled 2022-09-09: qty 5

## 2022-09-09 MED ORDER — DEXTROSE 50 % IV SOLN: 1.0000 | INTRAVENOUS | Status: DC | PRN

## 2022-09-09 MED ORDER — SENNOSIDES-DOCUSATE SODIUM 8.6-50 MG PO TABS
1.0000 | ORAL_TABLET | Freq: Two times a day (BID) | ORAL | Status: DC
  Administered 2022-09-09 – 2022-09-11 (×5): 1 via ORAL
  Filled 2022-09-09 (×6): qty 1

## 2022-09-09 MED ORDER — MELATONIN 3 MG PO TABS: 3.0000 mg | ORAL_TABLET | Freq: Every evening | ORAL | Status: DC | PRN

## 2022-09-09 NOTE — Progress Notes (Signed)
PT Cancellation Note  Patient Details Name: [facility] MRN: [id number] DOB: May 30, 1986   Cancelled Treatment:    Reason Eval/Treat Not Completed: PT screened, no needs identified, will sign off Pt admitted with generalized weakness and diffuse metastatic cancer.  RN reports pt too weak for OOB, not eating, emesis, assist for rolling and repositioning in bed.  Palliative consult pending.  Pt does not appear able to tolerate PT at this time.  Reorder if new needs arise however PT to sign off at this time.   [name] [place] 09/09/2022, 12:51 PM Jannette Spanner PT, DPT Physical Therapist [facility] Preferred contact method: Secure Chat Weekend Pager Only: [phone] Office: [phone]

## 2022-09-09 NOTE — Progress Notes (Addendum)
PROGRESS NOTE    Natalie Hinton  [id number] DOB: 28-Jul-1985 DOA: 09/08/2022 PCP: [name], MD    Brief Narrative:  Natalie Hinton is a 37 y.o. 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     Assessment and Plan:  Generalized weakness: 1 week duration of generalized weakness, in the absence of any evidence of acute focal neurologic deficits, including no evidence of acute focal weakness to suggest acute CVA, with presenting CT head showed no evidence of acute intracranial process, including no evidence of interval changes relative to MRI brain and CT head performed during the first week of February 2024.  Suspect multifactorial contributions,, including potential attributions from dehydration, initial hypoglycemia, underlying diffuse metastatic disease, hypercalcemia, acute hyponatremia.  Of note, no overt evidence of underlying factious process at this time, including chest x-ray that demonstrates no evidence of infiltrate, although will also check procalcitonin level to further assess.  Additionally, urinalysis result is currently pending, notable in the setting of the patient's report of recent development of dysuria.  COVID, influenza, RSV PCR were all negative.   -does not sound like seizures as she was coherent through all of the episodes    Hypoglycemia: Initial CBG noted to be 58, subsequently improving to greater than 200 following interval amp of D50.  No known history of underlying diabetes, and not on any insulin or oral hypoglycemic agents at home.  Given the patient's diffusely metastatic disease, differential could include metastatic disease to the pancreas versus contribution from relative  decline in oral intake superimposed on protein calorie malnutrition.     SIRS criteria present: Presentation associated with leukocytosis, tachycardia, tachypnea.  However, in the absence of e/o underlying infection at this time, including, chest x-ray showing no evidence of infiltrate, along with COVID, influenza, RSV PCR all negative, criteria for sepsis not currently met.  suspect non-infectious factors contributing to these SIRS findings, including inflammatory/steroid off exacerbation of existing leukocytosis dating back to February 2024.   -continue IV abx and monitor for source  -right eye swollen but does not appear to be cellulitis  Acute hypoxic respiratory distress: in the context of presenting mild shortness of breath, will albeit with a element of chronicity to this, and no known baseline supplemental O2 requirements, presenting O2 sat note to be in the high 80s- improving into the mid to high 90s on 2 L nasal cannula, thereby meeting criteria for acute hypoxic respiratory distress as opposed to acute hypoxic respiratory failure at this time.  -Appears to be on basis of known diffusely metastatic disease to the lungs, with potential contribution from interval identification of right hemidiaphragm, which, per radiology read, appears consistent with known mediastinal disease.        Acute hypo-osmolar Hypovolemic hyponatremia:  -improved with IVF      Dehydration:  -in the setting of   recent decline in oral intake.   -IVF   Hypercalcemia: Presenting labs reflect serum calcium adjusted for concomitant hypoalbuminemia, noted to be 10.9.  Suspect element of dehydration, without overt pharmacologic contribution.   -improved with IVF       Lactic Acidosis: Initial lactate noted to be 3.4, with repeat lactic acid level pending following interval IV fluids.  Suspect that this is multifactorial in  nature, with likely contributions from underlying malignancy, dehydration, and relative  generalized tissue hypoperfusion given presenting initial soft blood pressures -continue IVF    QTc prolongation: Presenting EKG demonstrates QTc of 566 ms. outpatient medications that may be contributing to QTc prolongation: none.  -monitor on tele    Diffusely metastatic disease: Recently discovered pelvic mass with diffusely metastatic disease to brain, bone, lungs, undergoing palliative radiation, followed by gynecologic and radiation oncology, as further detailed above.  Per her radiation oncologist, she has been undergoing a Decadron taper over the last 3 weeks, with 3 remaining doses of this Decadron taper noted.  Patient, her family, notes that the patient is DNR.   -palliative care consult -resume home pain meds    Protein calorie malnutrition: patient noted weigh 40.8 kg with associated bmi of 17.6 at time of this presentation, with contributions from recent decline in oral intake -nutrition consult.      pregnancy test: Hcg: 12    palliative care consult for poor overall prognosis     DVT prophylaxis: SCDs Start: 09/08/22 2355    Code Status: DNR   Disposition Plan:  Level of care: Progressive Status is: Inpatient Remains inpatient appropriate because: needs palliative care eval and cultures    Consultants:  Palliative care    Subjective: Swelling right eye began in ER after IVF  Objective: Vitals:   09/09/22 0214 09/09/22 0258 09/09/22 0406 09/09/22 0845  BP:   (!) 96/55 (!) 81/51  Pulse:   (!) 113 (!) 108  Resp:  (!) 21 20 (!) 22  Temp:   99 F (37.2 C) 98 F (36.7 C)  TempSrc:   Oral Oral  SpO2:   97% (!) 83%  Weight: 39.8 kg     Height: 5' (1.524 m)       Intake/Output Summary (Last 24 hours) at 09/09/2022 1112 Last data filed at 09/09/2022 1006 Gross per 24 hour  Intake 3712.97 ml  Output 2425 ml  Net 1287.97 ml   Filed Weights   09/08/22 2139 09/09/22 0214  Weight: 40.8 kg 39.8 kg    Examination:   General: Appearance:     Thin/malnourished female in no acute distress     Lungs:      respirations unlabored, diminished  Heart:    Tachycardic.    MS:   All extremities are intact.    Neurologic:   Difficult to awaken       Data Reviewed: I have personally reviewed following labs and imaging studies  CBC: Recent Labs  Lab 09/08/22 2209 09/09/22 0638  WBC 41.4* 37.1*  NEUTROABS 36.9* 33.5*  HGB 11.6* 9.5*  HCT 34.8* 28.9*  MCV 84.9 86.0  PLT 305 [id number]   Basic Metabolic Panel: Recent Labs  Lab 09/08/22 0440 09/08/22 2209 09/09/22 0528  NA  --  126* 130*  K  --  3.5 3.4*  CL  --  85* 94*  CO2  --  27 22  GLUCOSE  --  58* 55*  BUN  --  20 17  CREATININE  --  0.79 0.64  CALCIUM  --  9.7 9.0  MG 2.0  --  1.9  PHOS  --   --  4.5   GFR: Estimated Creatinine Clearance: 61.1 mL/min (by C-G formula based on SCr of 0.64 mg/dL). Liver Function Tests: Recent Labs  Lab 09/08/22 2209 09/09/22 0528  AST 42* 44*  ALT 17 14  ALKPHOS 216* 179*  BILITOT 0.7 0.8  PROT 6.4* 5.7*  ALBUMIN 2.5* 2.2*   No results for input(s): "LIPASE", "AMYLASE" in the last 168 hours. No results for input(s): "AMMONIA" in the last 168 hours. Coagulation Profile: Recent Labs  Lab 09/08/22 2209  INR 1.2   Cardiac Enzymes: No results for input(s): "CKTOTAL", "CKMB", "CKMBINDEX", "TROPONINI" in the last 168 hours. BNP (last 3 results) No results for input(s): "PROBNP" in the last 8760 hours. HbA1C: No results for input(s): "HGBA1C" in the last 72 hours. CBG: Recent Labs  Lab 09/08/22 2143 09/08/22 2213 09/09/22 0120 09/09/22 0830  GLUCAP 58* 209* 80 82   Lipid Profile: No results for input(s): "CHOL", "HDL", "LDLCALC", "TRIG", "CHOLHDL", "LDLDIRECT" in the last 72 hours. Thyroid Function Tests: Recent Labs    09/09/22 0051  TSH 0.621   Anemia Panel: No results for input(s): "VITAMINB12", "FOLATE", "FERRITIN", "TIBC", "IRON", "RETICCTPCT" in the last 72 hours. Sepsis Labs: Recent Labs  Lab  09/08/22 0440 09/08/22 2209 09/09/22 0528  PROCALCITON 8.05  --   --   LATICACIDVEN  --  3.4* 2.9*    Recent Results (from the past 240 hour(s))  Culture, blood (Routine x 2)     Status: None (Preliminary result)   Collection Time: 09/08/22  9:48 PM   Specimen: BLOOD  Result Value Ref Range Status   Specimen Description   Final    BLOOD BLOOD RIGHT ARM Performed at [facility] [address], [place], [place] [address]    Special Requests   Final    BOTTLES DRAWN AEROBIC AND ANAEROBIC Blood Culture results may not be optimal due to an excessive volume of blood received in culture bottles Performed at [facility] [address], [place], [place] [address]    Culture   Final    NO GROWTH < 12 HOURS Performed at [facility] [address], [place], [place] [address]    Report Status PENDING  Incomplete  Culture, blood (Routine x 2)     Status: None (Preliminary result)   Collection Time: 09/08/22 10:03 PM   Specimen: BLOOD  Result Value Ref Range Status   Specimen Description   Final    BLOOD RIGHT ANTECUBITAL Performed at [facility] [address], [place], [place] [address]    Special Requests   Final    BOTTLES DRAWN AEROBIC AND ANAEROBIC Blood Culture results may not be optimal due to an excessive volume of blood received in culture bottles Performed at [facility] [address], [place], [place] [address]    Culture   Final    NO GROWTH < 12 HOURS Performed at [facility] [address], [place], [place] [address]    Report Status PENDING  Incomplete  Resp panel by RT-PCR (RSV, Flu A&B, Covid) Anterior Nasal Swab     Status: None   Collection Time: 09/08/22 10:21 PM   Specimen: Anterior Nasal Swab  Result Value Ref Range Status   SARS Coronavirus 2 by RT PCR NEGATIVE NEGATIVE Final    Comment: (NOTE) SARS-CoV-2 target nucleic acids are NOT DETECTED.  The  SARS-CoV-2 RNA is generally detectable in upper respiratory specimens during the acute phase of infection. The lowest concentration of SARS-CoV-2 viral copies this assay can detect is 138 copies/mL. A negative result does not preclude SARS-Cov-2 infection and should not be used as the sole basis for treatment or other patient management decisions. A negative result may occur with  improper specimen collection/handling, submission of specimen other  than nasopharyngeal swab, presence of viral mutation(s) within the areas targeted by this assay, and inadequate number of viral copies(<138 copies/mL). A negative result must be combined with clinical observations, patient history, and epidemiological information. The expected result is Negative.  Fact Sheet for Patients:  EntrepreneurPulse.com.au  Fact Sheet for Healthcare Providers:  IncredibleEmployment.be  This test is no t yet approved or cleared by the [place] FDA and  has been authorized for detection and/or diagnosis of SARS-CoV-2 by FDA under an Emergency Use Authorization (EUA). This EUA will remain  in effect (meaning this test can be used) for the duration of the COVID-19 declaration under Section 564(b)(1) of the Act, 21 U.S.C.section 360bbb-3(b)(1), unless the authorization is terminated  or revoked sooner.       Influenza A by PCR NEGATIVE NEGATIVE Final   Influenza B by PCR NEGATIVE NEGATIVE Final    Comment: (NOTE) The Xpert Xpress SARS-CoV-2/FLU/RSV plus assay is intended as an aid in the diagnosis of influenza from Nasopharyngeal swab specimens and should not be used as a sole basis for treatment. Nasal washings and aspirates are unacceptable for Xpert Xpress SARS-CoV-2/FLU/RSV testing.  Fact Sheet for Patients: EntrepreneurPulse.com.au  Fact Sheet for Healthcare Providers: IncredibleEmployment.be  This test is not yet approved or  cleared by the [place] FDA and has been authorized for detection and/or diagnosis of SARS-CoV-2 by FDA under an Emergency Use Authorization (EUA). This EUA will remain in effect (meaning this test can be used) for the duration of the COVID-19 declaration under Section 564(b)(1) of the Act, 21 U.S.C. section 360bbb-3(b)(1), unless the authorization is terminated or revoked.     Resp Syncytial Virus by PCR NEGATIVE NEGATIVE Final    Comment: (NOTE) Fact Sheet for Patients: EntrepreneurPulse.com.au  Fact Sheet for Healthcare Providers: IncredibleEmployment.be  This test is not yet approved or cleared by the [place] FDA and has been authorized for detection and/or diagnosis of SARS-CoV-2 by FDA under an Emergency Use Authorization (EUA). This EUA will remain in effect (meaning this test can be used) for the duration of the COVID-19 declaration under Section 564(b)(1) of the Act, 21 U.S.C. section 360bbb-3(b)(1), unless the authorization is terminated or revoked.  Performed at [facility], [place] [address] [place], [place] [address]          Radiology Studies: CT Head Wo Contrast  Result Date: 09/08/2022 CLINICAL DATA:  Known sellar mass. History of unspecified metastatic cancer. Seizure activity earlier today. Frequent falls. EXAM: CT HEAD WITHOUT CONTRAST TECHNIQUE: Contiguous axial images were obtained from the base of the skull through the vertex without intravenous contrast. RADIATION DOSE REDUCTION: This exam was performed according to the departmental dose-optimization program which includes automated exposure control, adjustment of the mA and/or kV according to patient size and/or use of iterative reconstruction technique. COMPARISON:  MRI without and with contrast 08/09/2022, CT without contrast 08/07/2022. FINDINGS: Brain: Again noted is a heterogeneous sellar mass, grossly unchanged in size estimated to  measure 2.1 x 1.5 x 1.3 cm. There is again noted invasion of the cavernous sinuses, erosion through the sellar floor and protrusion into superior right sphenoid sinus. Findings of thickening of the overlap pituitary stalk and protrusion into the suprasellar cistern or better demonstrated on MRI. The cerebral and cerebellar hemispheres, midbrain and brainstem show no evidence of an acute infarct, hemorrhage or mass. The ventricles are normal in size and position. Basal cisterns are clear. Vascular: Narrowing due to mass effect of both cavernous ICAs was noted on  MRI but is not well seen on CT. There are no hyperdense central vessel. Skull: Negative for fractures or focal lesions. No scalp hematoma is evident. Sinuses/Orbits: Negative visualized orbits. There is interval increased opacification of the right sphenoid air cell underlying the invading tumor. This appears to be primarily due to fluid and membrane thickening with mild membrane disease in the ethmoid and left sphenoid sinuses. Other: None. IMPRESSION: 1. No acute intracranial CT findings or interval changes. 2. 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 3. Increased opacification of the right sphenoid air cell underlying the invading tumor. This appears to be primarily due to fluid and membrane thickening with mild membrane disease in the ethmoid and left sphenoid sinuses. 4. Narrowing due to mass effect of both cavernous ICAs was noted on MRI but is not well seen on CT. There are no hyperdense central vessels. Electronically Signed   By: Telford Nab M.D.   On: 09/08/2022 23:23   DG Chest Port 1 View  Result Date: 09/08/2022 CLINICAL DATA:  Seizure-like activity, sepsis, history of metastatic disease of unknown primary EXAM: PORTABLE CHEST 1 VIEW  COMPARISON:  08/07/2022 FINDINGS: Scattered pulmonary nodules are again identified and stable. Cardiac shadow is within normal limits. Left lung is well aerated. Right lung shows elevation of the right hemidiaphragm new from the prior exam. No acute bony abnormality is noted. 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 New elevation of the right hemidiaphragm is noted. This may be related to the known mediastinal disease. Electronically Signed   By: Inez Catalina M.D.   On: 09/08/2022 22:26        Scheduled Meds:  dexamethasone  2 mg Oral Q48H   feeding supplement  237 mL Oral TID BM   megestrol  160 mg Oral Daily   morphine  15 mg Oral Q12H   multivitamin with minerals  1 tablet Oral Daily   senna-docusate  1 tablet Oral BID   Continuous Infusions:  aztreonam     lactated ringers 150 mL/hr at 09/09/22 0300     LOS: 1 day    Time spent: 45 minutes spent on chart review, discussion with nursing staff, consultants, updating family and interview/physical exam; more than 50% of that time was spent in counseling and/or coordination of care.    Geradine Girt, DO [facility] Available via Epic secure chat 7am-7pm After these hours, please refer to coverage provider listed on [email or web address] 09/09/2022, 11:12 AM

## 2022-09-09 NOTE — Progress Notes (Addendum)
Came back to evaluate patient as her BP continues to be low and concern that patient is not improving with the appropriate care she is being given.  Patient does not engage with me in speaking about prognosis/GOC.  Have paged palliative care to see if they will be seeing today.  Will get x ray and start IV albumin as concern that she is 3rd spacing.  Very poor prognosis.  Suspect in hospital death. Called significant other Peewee and updated on my concerns.    Eulogio Bear DO

## 2022-09-09 NOTE — Progress Notes (Signed)
Initial Nutrition Assessment  DOCUMENTATION CODES:   Severe malnutrition in context of chronic illness, Underweight  INTERVENTION:   -Ensure Plus High Protein po TID, each supplement provides 350 kcal and 20 grams of protein.   -Multivitamin with minerals daily  NUTRITION DIAGNOSIS:   Severe Malnutrition related to chronic illness, cancer and cancer related treatments as evidenced by severe fat depletion, severe muscle depletion, energy intake < or equal to 75% for > or equal to 1 month.  GOAL:   Patient will meet greater than or equal to 90% of their needs  MONITOR:   PO intake, Supplement acceptance, Labs, Weight trends, I & O's  REASON FOR ASSESSMENT:   Consult Assessment of nutrition requirement/status  ASSESSMENT:   37 y.o. 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  Patient lying in bed, no family at bedside. Pt with short quick breathing. Was able to answer my questions. Pt reports having a poor appetite and not eating well for months. Was seen by [place] RD on 2/14, started to have improved appetite at that time, was drinking Ensure at home, prefers chocolate. Pt states she has been trying to drink 2 Ensures daily. Not eating much else. Pt states she has been taking her folic acid and thiamine at home. Agreed to receive MVI here as well as chocolate Ensure. Encouraged pt to try and drink 3 daily.   Pt reports UBW is ~90 lbs Current weight: 87 lbs Was unable to give exact time frame of weight loss.  Medications: Megace, Senokot, Lactated ringers, KCl  Labs reviewed: CBGs: 80-82 Low Na Low K   NUTRITION - FOCUSED PHYSICAL EXAM:  Flowsheet Row Most Recent Value  Orbital Region Severe depletion  Upper Arm Region Severe depletion   Thoracic and Lumbar Region Severe depletion  Buccal Region Severe depletion  Temple Region Severe depletion  Clavicle Bone Region Severe depletion  Clavicle and Acromion Bone Region Severe depletion  Scapular Bone Region Severe depletion  Dorsal Hand Severe depletion  Patellar Region Severe depletion  Anterior Thigh Region Severe depletion  Posterior Calf Region Severe depletion  Edema (RD Assessment) None  Hair Reviewed  Eyes Reviewed  [right eye swollen shut, denies vision issues with left eye]  Mouth Reviewed  Skin Reviewed  Nails Reviewed       Diet Order:   Diet Order             Diet regular Room service appropriate? Yes; Fluid consistency: Thin  Diet effective now                   EDUCATION NEEDS:   No education needs have been identified at this time  Skin:  Skin Assessment: Reviewed RN Assessment  Last BM:  3/7  Height:   Ht Readings from Last 1 Encounters:  09/09/22 5' (1.524 m)    Weight:   Wt Readings from Last 1 Encounters:  09/09/22 39.8 kg    BMI:  Body mass index is 17.14 kg/m.  Estimated Nutritional Needs:   Kcal:  1450-1650  Protein:  75-90g  Fluid:  1.7L/day  Clayton Bibles, MS, RD, LDN Inpatient Clinical Dietitian Contact information available via Amion

## 2022-09-09 NOTE — Progress Notes (Signed)
Pharmacy Antibiotic Note  Natalie Hinton is a 37 y.o. female admitted on 09/08/2022 with UTI.  Pharmacy has been consulted for aztreonam dosing. Pt with listed PCN allergy (hives), no record of receiving a PCN or cephalosporin in Epic.  Plan: Aztreonam 1g IV q8h Follow up renal function & cultures  Height: 5' (152.4 cm) Weight: 39.8 kg (87 lb 11.9 oz) IBW/kg (Calculated) : 45.5  Temp (24hrs), Avg:98.6 F (37 C), Min:97.8 F (36.6 C), Max:99 F (37.2 C)  Recent Labs  Lab 09/08/22 2209 09/09/22 0528 09/09/22 0638  WBC 41.4*  --  37.1*  CREATININE 0.79 0.64  --   LATICACIDVEN 3.4* 2.9*  --     Estimated Creatinine Clearance: 61.1 mL/min (by C-G formula based on SCr of 0.64 mg/dL).    Allergies  Allergen Reactions   Penicillins Hives   Gabapentin Other (See Comments)    hallucinations    Antimicrobials this admission: 3/6 Vanc/Flagyl x 1 3/6 Aztreonam >>  Dose adjustments this admission:  Microbiology results: 3/6 BCx: 3/7 UCx:  Thank you for allowing pharmacy to be a part of this patient's care.  Peggyann Juba, PharmD, BCPS Pharmacy: [phone] 09/09/2022 7:59 AM

## 2022-09-09 NOTE — TOC Progression Note (Signed)
Transition of Care [facility]) - Progression Note    Patient Details  Name: Natalie Hinton MRN: [id number] Date of Birth: 17-Nov-1985  Transition of Care [facility]) CM/SW Contact  Henrietta Dine, RN Phone Number: 09/09/2022, 3:44 PM  Clinical Narrative:    [facility] consult for d/c needs; pt aslee[; no family present in room; unable to complete TOC assessment.        Expected Discharge Plan and Services                                               Social Determinants of Health (SDOH) Interventions SDOH Screenings   Food Insecurity: No Food Insecurity (09/09/2022)  Housing: Low Risk  (09/09/2022)  Transportation Needs: Unmet Transportation Needs (09/09/2022)  Utilities: Not At Risk (09/09/2022)  Depression (PHQ2-9): High Risk (07/21/2022)  Tobacco Use: High Risk (09/09/2022)    Readmission Risk Interventions    08/12/2022    3:03 PM  Readmission Risk Prevention Plan  Post Dischage Appt Complete  Medication Screening Complete  Transportation Screening Complete

## 2022-09-09 NOTE — Consult Note (Signed)
Palliative Care Consult Note                                  Date: 09/09/2022   Patient Name: Natalie Hinton  DOB: 1986/04/27  MRN: [id number]  Age / Sex: 37 y.o., female  PCP: [name], MD Referring Physician: Geradine Girt, DO  Reason for Consultation: Establishing goals of care  HPI/Patient Profile: 37 y.o. 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 She was recently hospitalized at [facility]  08/07/22 - 08/16/22 with acute encephalopathy and was found to have brain mets. She underwent palliative radiation (completed 08/23/22).   She presented to [facility] ED 09/08/22 with generalized weakness and was found to have hypoglycemia, dehydration, hypercalcemia, hypotension, and lactic acidosis.    Palliative Medicine was consulted for goals of care in the setting of metastatic cancer.    Subjective:   I have reviewed medical records including progress notes, labs and imaging, and assessed the patient at bedside. She is lethargic, but complaining of pain. RN is at bedside hanging albumin for ongoing hypotension. She reports that patient is not tolerating PO meds due to nausea/vomiting.  I spoke to her significant other Natalie Hinton by phone to discuss diagnosis, prognosis, Tyrrell, EOL wishes, disposition, and options.  Natalie Hinton is known to PMT from her recent hospitalization. I re-introduced Palliative Medicine as specialized medical care for people living with serious illness. It focuses on providing relief from the symptoms and stress of a serious illness.   We discussed [name]'s current clinical status. Natural disease trajectory of widely metastatic cancer was discussed.  I shared my concern that [name] is approaching end-of-life and will not likely survive this hospitalization.  Created space and opportunity for [name] to express thoughts regarding [name]'s current medical situation.  Natalie Hinton is  understandably emotional.  He shares that [name] told him she was "ready to go" when she was in the ED yesterday.  The difference between full scope medical intervention and comfort care was considered.  I recommended transitioning to comfort care, which would involve de-escalating  full scope medical interventions and allowing a natural course to occur.  Discussed that comfort care would mean discontinuing labs, cardiac monitoring, IV fluids, and antibiotics.  It would also mean liberalizing medications for pain and other symptom management at end-of-life.  Natalie Hinton verbalizes understanding of the situation, but is not ready to transition to comfort care at this time.  However, he is agreeable to liberalize pain medication even though it will likely worsen Nayla's hypotension.  Natalie Hinton states that he is on the way to the hospital and will also update Chelesea's mother.  I later spoke with Heath's cousin Natalie Hinton by phone and provided an update on Yadira's current condition.  Natalie Hinton is an Therapist, sports and was helpful with family communication during Lexii's recent hospitalization.  Above discussion and plan of care was communicated to Dr. Eliseo Squires and patient's RN.   Review of Systems  Unable to perform ROS   Objective:   Primary Diagnoses: Present on Admission:  Hypoglycemia  Acute hypoxic respiratory failure (HCC)  Acute hyponatremia  Dehydration  Hypercalcemia  Lactic acidosis  Pelvic mass  Metastatic cancer (HCC)  Protein-calorie malnutrition, severe   Physical Exam Vitals reviewed.  Constitutional:      Appearance: She is cachectic. She is ill-appearing.  Pulmonary:     Breath sounds: Rhonchi present.  Neurological:  Mental Status: She is lethargic.     Vital Signs:  BP (!) 86/64 (BP Location: Left Arm)   Pulse (!) 105   Temp 98.2 F (36.8 C) (Oral)   Resp (!) 24   Ht 5' (1.524 m)   Wt 39.8 kg   SpO2 97%   BMI 17.14 kg/m   Palliative Assessment/Data: PPS  20%     Assessment & Plan:   SUMMARY OF RECOMMENDATIONS   DNR/DNI as previously documented Continue current supportive interventions pending further family discussion No escalation of care Anticipate hospital death I will follow-up tomorrow  Symptom Management:  Dilaudid 0.5-1 mg IV every 3 hours as needed for pain or dyspnea Glycopyrrolate (ROBINUL) for excessive secretions Ondansetron (ZOFRAN) prn for nausea Antiseptic oral rinse (BIOTENE) prn for dry mouth  Primary Decision Maker: Significant other Natalie Hinton is patient's documented HCPOA    Thank you for allowing [place] to participate in the care of Natalie Hinton   Time Total: 90 minutes  Greater than 50%  of this time was spent counseling and coordinating care related to the above assessment and plan.  Signed by: Elie Confer, NP Palliative Medicine Team  Team Phone # [phone]  For individual providers, please see AMION

## 2022-09-10 DIAGNOSIS — J9601 Acute respiratory failure with hypoxia: Secondary | ICD-10-CM | POA: Diagnosis not present

## 2022-09-10 DIAGNOSIS — R531 Weakness: Secondary | ICD-10-CM | POA: Diagnosis not present

## 2022-09-10 DIAGNOSIS — E43 Unspecified severe protein-calorie malnutrition: Secondary | ICD-10-CM | POA: Diagnosis not present

## 2022-09-10 DIAGNOSIS — C799 Secondary malignant neoplasm of unspecified site: Secondary | ICD-10-CM | POA: Diagnosis not present

## 2022-09-10 LAB — BASIC METABOLIC PANEL
Anion gap: 13 (ref 5–15)
BUN: 16 mg/dL (ref 6–20)
CO2: 27 mmol/L (ref 22–32)
Calcium: 9.4 mg/dL (ref 8.9–10.3)
Chloride: 101 mmol/L (ref 98–111)
Creatinine, Ser: 0.64 mg/dL (ref 0.44–1.00)
GFR, Estimated: >60 mL/min (ref >60)
Glucose, Bld: 118 mg/dL — ABNORMAL HIGH (ref 70–99)
Potassium: 3.1 mmol/L — ABNORMAL LOW (ref 3.5–5.1)
Sodium: 141 mmol/L (ref 135–145)

## 2022-09-10 LAB — URINE CULTURE: Culture: NO GROWTH

## 2022-09-10 LAB — LACTIC ACID, PLASMA: Lactic Acid, Venous: 2.1 mmol/L (ref 0.5–1.9)

## 2022-09-10 LAB — HEMOGLOBIN A1C
Hgb A1c MFr Bld: 5.8 % — ABNORMAL HIGH (ref 4.8–5.6)
Mean Plasma Glucose: 120 mg/dL

## 2022-09-10 LAB — PARATHYROID HORMONE, INTACT (NO CA): PTH: 3 pg/mL — ABNORMAL LOW (ref 15–65)

## 2022-09-10 MED ORDER — POTASSIUM CHLORIDE CRYS ER 20 MEQ PO TBCR
40.0000 meq | EXTENDED_RELEASE_TABLET | Freq: Once | ORAL | Status: AC
  Administered 2022-09-10: 40 meq via ORAL
  Filled 2022-09-10: qty 2

## 2022-09-10 MED ORDER — HYDROMORPHONE HCL 2 MG PO TABS
6.0000 mg | ORAL_TABLET | ORAL | Status: DC | PRN
  Administered 2022-09-10 – 2022-09-11 (×2): 6 mg via ORAL
  Filled 2022-09-10 (×3): qty 3

## 2022-09-10 MED ORDER — HYDROMORPHONE HCL 1 MG/ML IJ SOLN
0.5000 mg | INTRAMUSCULAR | Status: DC | PRN
  Administered 2022-09-10 – 2022-09-11 (×3): 0.5 mg via INTRAVENOUS
  Filled 2022-09-10 (×3): qty 0.5

## 2022-09-10 MED ORDER — MAGIC MOUTHWASH
5.0000 mL | Freq: Four times a day (QID) | ORAL | Status: DC
  Administered 2022-09-10 – 2022-09-12 (×9): 5 mL via ORAL
  Filled 2022-09-10 (×11): qty 5

## 2022-09-10 MED ORDER — CHLORHEXIDINE GLUCONATE CLOTH 2 % EX PADS: 6.0000 | MEDICATED_PAD | Freq: Every day | CUTANEOUS | Status: DC

## 2022-09-10 NOTE — Progress Notes (Signed)
PROGRESS NOTE    Shamonique CLEMENTINA ALAIMO  [id number] DOB: Dec 31, 1985 DOA: 09/08/2022 PCP: [name], MD    Brief Narrative:  Natalie Hinton is a 37 y.o. 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  During last hospitalization hospice/comfort care was recommended.    Assessment and Plan:  Generalized weakness:  -multifactorial -does not sound like seizures as she was coherent through all of the episodes -needs hospice and comfort care    Hypoglycemia: Initial CBG noted to be 58, subsequently improving to greater than 200 following interval amp of D50.  -improved -taking in liquids but not solids -SLP eval   SIRS criteria present: Presentation associated with leukocytosis, tachycardia, tachypnea.  However, in the absence of e/o underlying infection at this time, including, chest x-ray showing no evidence of infiltrate, along with COVID, influenza, RSV PCR all negative, criteria for sepsis not currently met.  suspect non-infectious factors contributing to these SIRS findings, including inflammatory/steroid off exacerbation of existing leukocytosis dating back to February 2024.   -continue IV abx and monitor for source- cultures negative  -right eye swelling better  Acute hypoxic respiratory distress: in the context of presenting mild shortness of breath, will albeit with a element of chronicity to this, and no known baseline supplemental O2 requirements, presenting O2 sat note to be in the high 80s- improving into the mid to high 90s on 2 L nasal cannula, thereby meeting criteria for acute hypoxic respiratory distress as opposed to acute hypoxic respiratory failure at this time.  -Appears to be on basis of known diffusely  metastatic disease to the lungs, with potential contribution from interval identification of right hemidiaphragm, which, per radiology read, appears consistent with known mediastinal disease.        Acute hypo-osmolar Hypovolemic hyponatremia:  -improved with IVF/albumin      Dehydration:  -in the setting of   recent decline in oral intake.     Hypercalcemia: Presenting labs reflect serum calcium adjusted for concomitant hypoalbuminemia, noted to be 10.9.  Suspect element of dehydration, without overt pharmacologic contribution.   -improved with IVF      Lactic Acidosis: Initial lactate noted to be 3.4, with repeat lactic acid level pending following interval IV fluids.  Suspect that this is multifactorial in nature, with likely contributions from underlying malignancy, dehydration, and relative generalized tissue hypoperfusion given presenting initial soft blood pressures -continue IVF    QTc prolongation: Presenting EKG demonstrates QTc of 566 ms. outpatient medications that may be contributing to QTc prolongation: none.  -monitor on tele    Diffusely metastatic disease: Recently discovered pelvic mass with diffusely metastatic disease to brain, bone, lungs, undergoing palliative radiation, followed by gynecologic and radiation oncology, as further detailed above.  Per her radiation oncologist, she has been undergoing a Decadron taper over the last 3 weeks, with 3 remaining doses of this Decadron taper noted.  Patient, her family, notes that the patient is DNR.   -palliative care consult -resume home pain meds    Protein calorie malnutrition: patient noted weigh 40.8 kg with associated bmi of 17.6 at time of this presentation, with contributions from recent decline in oral intake -nutrition consult.   Hypokalemia -replete    pregnancy test: Hcg: 12- ? From cancer  palliative care consult for poor overall prognosis- recommend hospice-- patient willing to do hospice at home      DVT prophylaxis: SCDs Start: 09/08/22 2355    Code Status: DNR   Disposition Plan:  Level of care: Progressive Status is: Inpatient Remains inpatient appropriate because: needs palliative care eval    Consultants:  Palliative care    Subjective: Taking in liquids today  Objective: Vitals:   09/09/22 1923 09/09/22 2334 09/10/22 0425 09/10/22 0455  BP: (!) 89/72 95/66  110/72  Pulse: (!) 102 95  98  Resp: '18 18  18  '$ Temp: 98.5 F (36.9 C) 98.3 F (36.8 C)  97.7 F (36.5 C)  TempSrc: Oral Oral  Oral  SpO2: 92% 93%  93%  Weight:   37.1 kg   Height:        Intake/Output Summary (Last 24 hours) at 09/10/2022 1121 Last data filed at 09/10/2022 0945 Gross per 24 hour  Intake 2848.63 ml  Output 3000 ml  Net -151.37 ml   Filed Weights   09/08/22 2139 09/09/22 0214 09/10/22 0425  Weight: 40.8 kg 39.8 kg 37.1 kg    Examination:   General: Appearance:    Thin/frail female in no acute distress, mouth dry     Lungs:      respirations unlabored  Heart:    Normal heart rate.   MS:   All extremities are intact.   Neurologic:   Awake, alert       Data Reviewed: I have personally reviewed following labs and imaging studies  CBC: Recent Labs  Lab 09/08/22 2209 09/09/22 0638  WBC 41.4* 37.1*  NEUTROABS 36.9* 33.5*  HGB 11.6* 9.5*  HCT 34.8* 28.9*  MCV 84.9 86.0  PLT 305 [id number]   Basic Metabolic Panel: Recent Labs  Lab 09/08/22 0440 09/08/22 2209 09/09/22 0528 09/10/22 0553  NA  --  126* 130* 141  K  --  3.5 3.4* 3.1*  CL  --  85* 94* 101  CO2  --  '27 22 27  '$ GLUCOSE  --  58* 55* 118*  BUN  --  '20 17 16  '$ CREATININE  --  0.79 0.64 0.64  CALCIUM  --  9.7 9.0 9.4  MG 2.0  --  1.9  --   PHOS  --   --  4.5  --    GFR: Estimated Creatinine Clearance: 56.9 mL/min (by C-G formula based on SCr of 0.64 mg/dL). Liver Function Tests: Recent Labs  Lab 09/08/22 2209 09/09/22 0528  AST 42* 44*  ALT 17 14  ALKPHOS 216* 179*  BILITOT 0.7 0.8  PROT 6.4*  5.7*  ALBUMIN 2.5* 2.2*   No results for input(s): "LIPASE", "AMYLASE" in the last 168 hours. No results for input(s): "AMMONIA" in the last 168 hours. Coagulation Profile: Recent Labs  Lab 09/08/22 2209  INR 1.2   Cardiac Enzymes: No results for input(s): "CKTOTAL", "CKMB", "CKMBINDEX", "TROPONINI" in the last 168 hours. BNP (last 3 results) No results for input(s): "PROBNP" in the last 8760 hours. HbA1C: Recent Labs    09/09/22 0638  HGBA1C 5.8*   CBG: Recent Labs  Lab 09/08/22 2143 09/08/22 2213 09/09/22 0120 09/09/22 0830 09/09/22 1252  GLUCAP 58* 209* 80 82 93   Lipid Profile: No results for input(s): "CHOL", "HDL", "LDLCALC", "TRIG", "CHOLHDL", "LDLDIRECT" in the last 72 hours. Thyroid Function Tests: Recent Labs    09/09/22 0051  TSH 0.621   Anemia Panel: No results for input(s): "VITAMINB12", "FOLATE", "FERRITIN", "TIBC", "  IRON", "RETICCTPCT" in the last 72 hours. Sepsis Labs: Recent Labs  Lab 09/08/22 0440 09/08/22 2209 09/09/22 0528 09/10/22 0553  PROCALCITON 8.05  --   --   --   LATICACIDVEN  --  3.4* 2.9* 2.1*    Recent Results (from the past 240 hour(s))  Culture, blood (Routine x 2)     Status: None (Preliminary result)   Collection Time: 09/08/22  9:48 PM   Specimen: BLOOD  Result Value Ref Range Status   Specimen Description   Final    BLOOD BLOOD RIGHT ARM Performed at [facility] [address] [place], [place] [address]    Special Requests   Final    BOTTLES DRAWN AEROBIC AND ANAEROBIC Blood Culture results may not be optimal due to an excessive volume of blood received in culture bottles Performed at [facility] [address], [place], [place] [address]    Culture   Final    NO GROWTH 2 DAYS Performed at [facility] [address] [place], [place] [address]    Report Status PENDING  Incomplete  Culture, blood (Routine x 2)     Status: None (Preliminary result)   Collection  Time: 09/08/22 10:03 PM   Specimen: BLOOD  Result Value Ref Range Status   Specimen Description   Final    BLOOD RIGHT ANTECUBITAL Performed at [facility] [address], [place], [place] [address]    Special Requests   Final    BOTTLES DRAWN AEROBIC AND ANAEROBIC Blood Culture results may not be optimal due to an excessive volume of blood received in culture bottles Performed at [facility] [address] [place], [place] [address]    Culture   Final    NO GROWTH 2 DAYS Performed at [facility] [address] [place], [place] [address]    Report Status PENDING  Incomplete  Resp panel by RT-PCR (RSV, Flu A&B, Covid) Anterior Nasal Swab     Status: None   Collection Time: 09/08/22 10:21 PM   Specimen: Anterior Nasal Swab  Result Value Ref Range Status   SARS Coronavirus 2 by RT PCR NEGATIVE NEGATIVE Final    Comment: (NOTE) SARS-CoV-2 target nucleic acids are NOT DETECTED.  The SARS-CoV-2 RNA is generally detectable in upper respiratory specimens during the acute phase of infection. The lowest concentration of SARS-CoV-2 viral copies this assay can detect is 138 copies/mL. A negative result does not preclude SARS-Cov-2 infection and should not be used as the sole basis for treatment or other patient management decisions. A negative result may occur with  improper specimen collection/handling, submission of specimen other than nasopharyngeal swab, presence of viral mutation(s) within the areas targeted by this assay, and inadequate number of viral copies(<138 copies/mL). A negative result must be combined with clinical observations, patient history, and epidemiological information. The expected result is Negative.  Fact Sheet for Patients:  EntrepreneurPulse.com.au  Fact Sheet for Healthcare Providers:  IncredibleEmployment.be  This test is no t yet approved or cleared by the [place]  FDA and  has been authorized for detection and/or diagnosis of SARS-CoV-2 by FDA under an Emergency Use Authorization (EUA). This EUA will remain  in effect (meaning this test can be used) for the duration of the COVID-19 declaration under Section 564(b)(1) of the Act, 21 U.S.C.section 360bbb-3(b)(1), unless the authorization is terminated  or revoked sooner.       Influenza A by PCR NEGATIVE  NEGATIVE Final   Influenza B by PCR NEGATIVE NEGATIVE Final    Comment: (NOTE) The Xpert Xpress SARS-CoV-2/FLU/RSV plus assay is intended as an aid in the diagnosis of influenza from Nasopharyngeal swab specimens and should not be used as a sole basis for treatment. Nasal washings and aspirates are unacceptable for Xpert Xpress SARS-CoV-2/FLU/RSV testing.  Fact Sheet for Patients: EntrepreneurPulse.com.au  Fact Sheet for Healthcare Providers: IncredibleEmployment.be  This test is not yet approved or cleared by the [place] FDA and has been authorized for detection and/or diagnosis of SARS-CoV-2 by FDA under an Emergency Use Authorization (EUA). This EUA will remain in effect (meaning this test can be used) for the duration of the COVID-19 declaration under Section 564(b)(1) of the Act, 21 U.S.C. section 360bbb-3(b)(1), unless the authorization is terminated or revoked.     Resp Syncytial Virus by PCR NEGATIVE NEGATIVE Final    Comment: (NOTE) Fact Sheet for Patients: EntrepreneurPulse.com.au  Fact Sheet for Healthcare Providers: IncredibleEmployment.be  This test is not yet approved or cleared by the [place] FDA and has been authorized for detection and/or diagnosis of SARS-CoV-2 by FDA under an Emergency Use Authorization (EUA). This EUA will remain in effect (meaning this test can be used) for the duration of the COVID-19 declaration under Section 564(b)(1) of the Act, 21 U.S.C. section  360bbb-3(b)(1), unless the authorization is terminated or revoked.  Performed at [facility], [place] [address], [place], [place] [address]   Urine Culture     Status: None   Collection Time: 09/09/22 12:30 AM   Specimen: Urine, Random  Result Value Ref Range Status   Specimen Description   Final    URINE, RANDOM Performed at [facility] [address], [place], [place] [address]    Special Requests   Final    NONE Reflexed from [id number] Performed at [facility], [place] [address] [place], [place] [address]    Culture   Final    NO GROWTH Performed at [facility], [place] [address], [place], [place] [address]    Report Status 09/10/2022 FINAL  Final         Radiology Studies: DG CHEST PORT 1 VIEW  Result Date: 09/09/2022 CLINICAL DATA:  Aspiration EXAM: PORTABLE CHEST 1 VIEW COMPARISON:  CXR 09/08/22 FINDINGS: No pleural effusion. No pneumothorax. Redemonstrated bilateral nodular pulmonary opacities, compatible with known metastatic disease. Unchanged asymmetric elevation of the right hemidiaphragm. No new focal airspace opacity. No radiographically apparent displaced rib fractures. Visualized upper abdomen is unremarkable. IMPRESSION: 1. Redemonstrated bilateral nodular pulmonary opacities, compatible with known metastatic disease. 2. No new focal airspace opacity. Electronically Signed   By: Marin Roberts M.D.   On: 09/09/2022 16:18   CT Head Wo Contrast  Result Date: 09/08/2022 CLINICAL DATA:  Known sellar mass. History of unspecified metastatic cancer. Seizure activity earlier today. Frequent falls. EXAM: CT HEAD WITHOUT CONTRAST TECHNIQUE: Contiguous axial images were obtained from the base of the skull through the vertex without intravenous contrast. RADIATION DOSE REDUCTION: This exam was performed according to the departmental dose-optimization program which includes automated exposure control, adjustment of the mA  and/or kV according to patient size and/or use of iterative reconstruction technique. COMPARISON:  MRI without and with contrast 08/09/2022, CT without contrast 08/07/2022. FINDINGS: Brain: Again noted is a heterogeneous sellar mass, grossly unchanged in size estimated to measure 2.1 x 1.5 x 1.3 cm. There is again noted invasion of the cavernous sinuses, erosion through the  sellar floor and protrusion into superior right sphenoid sinus. Findings of thickening of the overlap pituitary stalk and protrusion into the suprasellar cistern or better demonstrated on MRI. The cerebral and cerebellar hemispheres, midbrain and brainstem show no evidence of an acute infarct, hemorrhage or mass. The ventricles are normal in size and position. Basal cisterns are clear. Vascular: Narrowing due to mass effect of both cavernous ICAs was noted on MRI but is not well seen on CT. There are no hyperdense central vessel. Skull: Negative for fractures or focal lesions. No scalp hematoma is evident. Sinuses/Orbits: Negative visualized orbits. There is interval increased opacification of the right sphenoid air cell underlying the invading tumor. This appears to be primarily due to fluid and membrane thickening with mild membrane disease in the ethmoid and left sphenoid sinuses. Other: None. IMPRESSION: 1. No acute intracranial CT findings or interval changes. 2. 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 3. Increased opacification of the right sphenoid air cell underlying the invading tumor. This appears to be primarily due to fluid and membrane thickening with mild membrane disease in the ethmoid and left sphenoid sinuses. 4. Narrowing due to mass effect of both cavernous ICAs was noted on MRI but is not well seen on CT. There are no  hyperdense central vessels. Electronically Signed   By: Telford Nab M.D.   On: 09/08/2022 23:23   DG Chest Port 1 View  Result Date: 09/08/2022 CLINICAL DATA:  Seizure-like activity, sepsis, history of metastatic disease of unknown primary EXAM: PORTABLE CHEST 1 VIEW COMPARISON:  08/07/2022 FINDINGS: Scattered pulmonary nodules are again identified and stable. Cardiac shadow is within normal limits. Left lung is well aerated. Right lung shows elevation of the right hemidiaphragm new from the prior exam. No acute bony abnormality is noted. 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 New elevation of the right hemidiaphragm is noted. This may be related to the known mediastinal disease. Electronically Signed   By: Inez Catalina M.D.   On: 09/08/2022 22:26        Scheduled Meds:  dexamethasone  2 mg Oral Q48H   feeding supplement  237 mL Oral TID BM   megestrol  160 mg Oral Daily   morphine  15 mg Oral Q12H   multivitamin with minerals  1 tablet Oral Daily   potassium chloride  40 mEq Oral Once   senna-docusate  1 tablet Oral BID   Continuous Infusions:  aztreonam 1 g (09/10/22 0848)     LOS: 2 days    Time spent: 45 minutes spent on chart review, discussion with nursing staff, consultants, updating family and interview/physical exam; more than 50% of that time was spent in counseling and/or coordination of care.    Geradine Girt, DO [facility] Available via Epic secure chat 7am-7pm After these hours, please refer to coverage provider listed on [email or web address] 09/10/2022, 11:21 AM

## 2022-09-10 NOTE — Progress Notes (Signed)
Palliative Medicine Progress Note   Patient Name: Natalie Hinton       Date: 09/10/2022 DOB: 06-16-1986  Age: 37 y.o. MRN#: [id number] Attending Physician: Geradine Girt, DO Primary Care Physician: [name], MD Admit Date: 09/08/2022  Reason for Consultation/Follow-up: {Reason for Consult:23484}  HPI/Patient Profile: 37 y.o. 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 She was recently hospitalized at [facility]  08/07/22 - 08/16/22 with acute encephalopathy and was found to have brain mets. She underwent palliative radiation (completed 08/23/22).    She presented to [facility] ED 09/08/22 with generalized weakness and was found to have hypoglycemia, dehydration, hypercalcemia, hypotension, and lactic acidosis.    Palliative Medicine was consulted for goals of care in the setting of metastatic cancer.   Subjective: ***  Objective:  Physical Exam          Vital Signs: BP 111/78 (BP Location: Left Arm)   Pulse 100   Temp 98.7 F (37.1 C) (Oral)   Resp 17   Ht 5' (1.524 m)   Wt 37.1 kg   SpO2 92%   BMI 15.97 kg/m  SpO2: SpO2: 92 % O2 Device: O2 Device: Room Air O2 Flow Rate: O2 Flow Rate (L/min): 2 L/min  Intake/output summary:  Intake/Output Summary (Last 24 hours) at 09/10/2022 1502 Last data filed at 09/10/2022 1300 Gross per 24 hour  Intake 1640.17 ml  Output 3350 ml  Net -1709.83 ml    LBM: Last BM Date : 09/09/22     Palliative Assessment/Data: ***     Palliative Medicine Assessment & Plan   Assessment: Principal Problem:   Generalized weakness Active Problems:   Pelvic mass   Metastatic cancer (HCC)   Protein-calorie malnutrition, severe   Fall at home, initial encounter   Hypoglycemia   SIRS (systemic  inflammatory response syndrome) (HCC)   Acute hypoxic respiratory failure (HCC)   Acute hyponatremia   Dehydration   Hypercalcemia   Lactic acidosis   Prolonged QT interval    Recommendations/Plan: ***  Goals of Care and Additional Recommendations: Limitations on Scope of Treatment: {Recommended Scope and Preferences:21019}  Code Status:   Prognosis:  {Palliative Care Prognosis:23504}  Discharge Planning: {Palliative dispostion:23505}  Care plan was discussed with ***  Thank you for allowing the Palliative Medicine  Team to assist in the care of this patient.   ***   Lavena Bullion, NP   Please contact Palliative Medicine Team phone at [phone] for questions and concerns.  For individual providers, please see AMION.

## 2022-09-10 NOTE — Progress Notes (Addendum)
PHARMACY NOTE -  Aztreonam  Pharmacy has been assisting with dosing of aztreonam for suspected UTI. Dosage remains stable at 1g IV q8 hr and further renal adjustments per institutional Pharmacy antibiotic protocol  Pharmacy will sign off, following peripherally for culture results, dose adjustments, and length of therapy. Please reconsult if a change in clinical status warrants re-evaluation of dosage.  Reuel Boom, PharmD, BCPS [phone] 09/10/2022, 11:41 AM

## 2022-09-10 NOTE — TOC Initial Note (Signed)
Transition of Care (TOC) - Initial/Assessment Note    Patient Details  Name: Natalie Hinton MRN: [id number] Date of Birth: 1985-07-28  Transition [facility]) CM/SW Contact:    Henrietta Dine, RN Phone Number: 09/10/2022, 4:29 PM  Clinical Narrative:                 [facility] consult for d/c needs and home hospice; spoke w/ and [place] [phone]) in room; pt says she is from home and plans to return at d/c; pt denies IPV, food insecurity and difficulty paying utilities; pt says she does not have transportation home; pt says she does not have glasses or HA; she does have dentures (upper/lower); pt says she has a rolling walker, shower chair, and BSC; she does not have [place] services or home oxygen; pt confirms she would like home hospice; list of agencies given to pt; she requests TOC follow up w/ her tomorrow;pt understands she/SO will contact agency of choice; TOC will follow up per pt request.  Expected Discharge Plan: Home w Hospice Care Barriers to Discharge: Continued Medical Work up   Patient Goals and CMS Choice Patient states their goals for this hospitalization and ongoing recovery are:: home CMS Medicare.gov Compare Post Acute Care list provided to:: Patient Choice offered to / list presented to : Patient      Expected Discharge Plan and Services   Discharge Planning Services: CM Consult Post Acute Care Choice: Hospice (awaiting agency choice) Living arrangements for the past 2 months: Apartment                                      Prior Living Arrangements/Services Living arrangements for the past 2 months: Apartment Lives with:: Significant Other Patient language and need for interpreter reviewed:: Yes        Need for Family Participation in Patient Care: Yes (Comment) Care giver support system in place?: Yes (comment) Current home services: DME (RW, BSC, shower chair) Criminal Activity/Legal Involvement Pertinent to Current  Situation/Hospitalization: No - Comment as needed  Activities of Daily Living Home Assistive Devices/Equipment: Walker (specify type) ADL Screening (condition at time of admission) Patient's cognitive ability adequate to safely complete daily activities?: Yes Is the patient deaf or have difficulty hearing?: No Does the patient have difficulty seeing, even when wearing glasses/contacts?: No Does the patient have difficulty concentrating, remembering, or making decisions?: No Patient able to express need for assistance with ADLs?: Yes Does the patient have difficulty dressing or bathing?: Yes Independently performs ADLs?: No Communication: Appropriate for developmental age Dressing (OT): Needs assistance Is this a change from baseline?: Change from baseline, expected to last >3 days Grooming: Needs assistance Is this a change from baseline?: Change from baseline, expected to last >3 days Feeding: Appropriate for developmental age Bathing: Needs assistance Is this a change from baseline?: Pre-admission baseline Toileting: Needs assistance Is this a change from baseline?: Change from baseline, expected to last >3days In/Out Bed: Needs assistance Is this a change from baseline?: Change from baseline, expected to last >3 days Walks in Home: Needs assistance Is this a change from baseline?: Change from baseline, expected to last >3 days Does the patient have difficulty walking or climbing stairs?: Yes Weakness of Legs: Both Weakness of Arms/Hands: Both  Permission Sought/Granted Permission sought to share information with : Case Manager Permission granted to share information with : Yes, Verbal Permission Granted  Share Information with NAME: Lenor Coffin, RN, CM     Permission granted to share info w Relationship: Marcelene Butte (signficant other) [phone]     Emotional Assessment Appearance:: Appears stated age Attitude/Demeanor/Rapport: Gracious Affect (typically  observed): Accepting Orientation: : Oriented to Self, Oriented to Place, Oriented to  Time, Oriented to Situation Alcohol / Substance Use: Not Applicable Psych Involvement: No (comment)  Admission diagnosis:  Hyponatremia [E87.1] Seizure ([place]) [R56.9] Hypoxia [R09.02] 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) [A41.9] Patient Active Problem List   Diagnosis Date Noted   Fall at home, initial encounter 09/09/2022   Hypoglycemia 09/09/2022   SIRS (systemic inflammatory response syndrome) ([place]) 09/09/2022   Acute hypoxic respiratory failure ([place]) 09/09/2022   Acute hyponatremia 09/09/2022   Dehydration 09/09/2022   Hypercalcemia 09/09/2022   Lactic acidosis 09/09/2022   Prolonged QT interval 09/09/2022   Generalized weakness 09/08/2022   Fever 08/10/2022   Cancer related pain 08/10/2022   Internal carotid artery stenosis, bilateral 08/10/2022   Protein-calorie malnutrition, severe 08/09/2022   Metastatic cancer ([place]) 08/08/2022   Acute encephalopathy 08/08/2022   Genetic testing 08/04/2022   Pelvic mass 07/20/2022   PCP:  [name], MD Pharmacy:   [facility] [place], [place] [address] Phone: [phone] Fax: [place] [address] Phone: [phone] Fax: [phone]     Social Determinants of Health (SDOH) Social History: SDOH Screenings   Food Insecurity: No Food Insecurity (09/10/2022)  Housing: Low Risk  (09/10/2022)  Transportation Needs: Unmet Transportation Needs (09/10/2022)  Utilities: Not At Risk (09/10/2022)  Depression (PHQ2-9): High Risk (07/21/2022)  Tobacco Use: High Risk (09/09/2022)   SDOH Interventions: Food Insecurity Interventions: Inpatient TOC Housing  Interventions: Inpatient TOC Transportation Interventions: Inpatient TOC Utilities Interventions: Inpatient TOC   Readmission Risk Interventions    08/12/2022    3:03 PM  Readmission Risk Prevention Plan  Post Dischage Appt Complete  Medication Screening Complete  Transportation Screening Complete

## 2022-09-10 NOTE — Evaluation (Signed)
Clinical/Bedside Swallow Evaluation Patient Details  Name: Natalie Hinton MRN: [id number] Date of Birth: 02-16-1986  Today's Date: 09/10/2022 Time: SLP Start Time (ACUTE ONLY): [id number] SLP Stop Time (ACUTE ONLY): 1536 SLP Time Calculation (min) (ACUTE ONLY): 9 min  Past Medical History:  Past Medical History:  Diagnosis Date   Allergy    Anxiety    Metastatic malignant neoplasm ([place])    Thyroid disease    Past Surgical History: History reviewed. No pertinent surgical history. HPI:  Natalie Hinton is a 37 y.o. [facility] with generalized weakness and was found to have hypoglycemia, dehydration, hypercalcemia, hypotension, and lactic acidosis. 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 She was recently hospitalized at [facility]  08/07/22 - 08/16/22 with acute encephalopathy and was found to have brain mets. She underwent palliative radiation (completed 08/23/22).    Assessment / Plan / Recommendation  Clinical Impression  Pt presented with functional oropharyngeal swallow, describing poor appetite and preference for liquids over solids. She was observed to eat/drink in her preferred side-lying position. Oral mechanism exam was unremarkable; she is edentulous. Her voice is aphonic and has been that way since radiation, per her report.  She appears to be protecting her airway well with liquids and solids with no concern for aspiration nor impaired bolus transfer.  Her family was at the bedside.  Recommend continuing regular solid/thin liquids per her preferences. No further SLP f/u is needed - our service will sign off. SLP Visit Diagnosis: Dysphagia, unspecified (R13.10)    Aspiration Risk  No limitations    Diet Recommendation   Regular solids, thin liquids  Medication Administration: Crushed with puree (if easier for pt)    Other  Recommendations Oral Care Recommendations: Oral care  BID    Recommendations for follow up therapy are one component of a multi-disciplinary discharge planning process, led by the attending physician.  Recommendations may be updated based on patient status, additional functional criteria and insurance authorization.  Follow up Recommendations No SLP follow up        Swallow Study   General HPI: Natalie Hinton is a 37 y.o. female who presented to [facility] ED 09/08/22 with generalized weakness and was found to have hypoglycemia, dehydration, hypercalcemia, hypotension, and lactic acidosis. 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 She was recently hospitalized at [facility]  08/07/22 - 08/16/22 with acute encephalopathy and was found to have brain mets. She underwent palliative radiation (completed 08/23/22). Type of Study: Bedside Swallow Evaluation Previous Swallow Assessment: no Diet Prior to this Study: Regular;Thin liquids (Level 0) Temperature Spikes Noted: No Respiratory Status: Room air History of Recent Intubation: No Behavior/Cognition: Alert;Cooperative Oral Cavity Assessment: Within Functional Limits Oral Care Completed by SLP: No Oral Cavity - Dentition: Edentulous Vision: Functional for self-feeding Self-Feeding Abilities: Able to feed self Patient Positioning: Partially reclined Baseline Vocal Quality: Aphonic Volitional Cough: Weak Volitional Swallow: Able to elicit    Oral/Motor/Sensory Function Overall Oral Motor/Sensory Function: Within functional limits   Ice Chips Ice chips: Not tested   Thin Liquid Thin Liquid: Within functional limits    Nectar Thick Nectar Thick Liquid: Not tested   Honey Thick Honey Thick Liquid: Not tested   Puree Puree: Within functional limits   Solid     Solid: Within functional limits      Natalie Hinton 09/10/2022,3:43  PM  Threasa Kinch L. Tivis Ringer, MA CCC/SLP Clinical Specialist - [place] Office number [phone]

## 2022-09-10 NOTE — Progress Notes (Signed)
   09/10/22 2030  Assess: MEWS Score  Temp 97.9 F (36.6 C)  BP 118/77  MAP (mmHg) 90  Pulse Rate (!) 115  Resp (!) 25  SpO2 (!) 74 %  Assess: MEWS Score  MEWS Temp 0  MEWS Systolic 0  MEWS Pulse 2  MEWS RR 1  MEWS LOC 1  MEWS Score 4  MEWS Score Color Red  Assess: if the MEWS score is Yellow or Red  Were vital signs taken at a resting state? Yes  Focused Assessment No change from prior assessment  Does the patient meet 2 or more of the SIRS criteria? No  Does the patient have a confirmed or suspected source of infection? Yes  Provider and Rapid Response Notified? Yes  MEWS guidelines implemented  Yes, red  Treat  MEWS Interventions Considered administering scheduled or prn medications/treatments as ordered  Take Vital Signs  Increase Vital Sign Frequency  Red: Q1hr x2, continue Q4hrs until patient remains green for 12hrs  Escalate  MEWS: Escalate Red: Discuss with charge nurse and notify provider. Consider notifying RRT. If remains red for 2 hours consider need for higher level of care  Notify: Charge Nurse/RN  Name of Charge Nurse/RN Notified [place], RN  Notify: Rapid Response  Name of Rapid Response RN Notified [place], RN  Date Rapid Response Notified 09/10/22  Time Rapid Response Notified 2056  Assess: SIRS CRITERIA  SIRS Temperature  0  SIRS Pulse 1  SIRS Respirations  1  SIRS WBC 0  SIRS Score Sum  2

## 2022-09-11 ENCOUNTER — Other Ambulatory Visit (HOSPITAL_COMMUNITY): Payer: Self-pay

## 2022-09-11 DIAGNOSIS — R531 Weakness: Secondary | ICD-10-CM | POA: Diagnosis not present

## 2022-09-11 MED ORDER — MORPHINE SULFATE (CONCENTRATE) 10 MG/0.5ML PO SOLN
10.0000 mg | ORAL | Status: DC | PRN
  Administered 2022-09-11 – 2022-09-12 (×4): 10 mg via ORAL
  Filled 2022-09-11 (×4): qty 0.5

## 2022-09-11 MED ORDER — HYDROMORPHONE HCL 1 MG/ML IJ SOLN
0.5000 mg | INTRAMUSCULAR | Status: DC | PRN
  Administered 2022-09-11 – 2022-09-12 (×10): 0.5 mg via INTRAVENOUS
  Filled 2022-09-11 (×10): qty 0.5

## 2022-09-11 MED ORDER — LORAZEPAM 2 MG/ML IJ SOLN: 0.5000 mg | Freq: Four times a day (QID) | INTRAMUSCULAR | Status: DC | PRN

## 2022-09-11 NOTE — TOC Progression Note (Signed)
Transition of Care [facility]) - Progression Note   Patient Details  Name: Natalie Hinton MRN: [id number] Date of Birth: September 15, 1985  Transition of Care [facility]) CM/SW [place], LCSW Phone Number: 09/11/2022, 12:30 PM  Clinical Narrative: Patient is requesting [facility] for hospice services and will need hospital bed rails, home oxygen, and a shower chair. Misty with [facility] has ordered the DME and it will be delivered to the home. CSW spoke with patient briefly and then she requested that CSW speak with her cousin, [name]. [name] asked several questions regarding hospice services, but CSW explained that hospice would have the answers for those questions rather than TOC.    Expected Discharge Plan: Home w Hospice Care Barriers to Discharge: Continued Medical Work up, Other (must enter comment) (Awaiting delivery of DME by hospice)  Expected Discharge Plan and Services Discharge Planning Services: CM Consult Post Acute Care Choice: Hospice (awaiting agency choice) Living arrangements for the past 2 months: Apartment             DME Arranged: Other see comment (Hospice to provide rails for hospital bed, home oxygen, and shower chair.) DME Agency: Other - Comment  Social Determinants of Health (SDOH) Interventions SDOH Screenings   Food Insecurity: No Food Insecurity (09/10/2022)  Housing: Low Risk  (09/10/2022)  Transportation Needs: Unmet Transportation Needs (09/10/2022)  Utilities: Not At Risk (09/10/2022)  Depression (PHQ2-9): High Risk (07/21/2022)  Tobacco Use: High Risk (09/09/2022)   Readmission Risk Interventions    08/12/2022    3:03 PM  Readmission Risk Prevention Plan  Post Dischage Appt Complete  Medication Screening Complete  Transportation Screening Complete

## 2022-09-11 NOTE — Progress Notes (Signed)
[facility] [address] [facility])  Hospital Liaison RN note  Received request from TOC/PMT for hospice services at home after discharge. Chart and patient information under review by Hospice physician.   Spoke with patient and family at bedside to initiate education related to hospice philosophy, services, and team approach to care. Patient/family verbalized understanding of information given. Per discussion, the plan is for discharge home by PMV on 3.10  DME needs discussed. Patient has the following equipment in the home. Hospital bed  Patient/family requests the following equipment for deliver: bed rails, BSC, oxygen 2L nasal cannula, rollator and shower chair.   Address has been verified and is correct in the chart. Ardyth Gal [phone] is the family contact to arrange time of equipment delivery.   Please send signed and completed DNR with patient/family. Please provide prescriptions at discharge as needed for ongoing symptom management.   Please call with any hospice related questions or concerns.  Thank you for the opportunity to participate in this patient's care.  Jhonnie Garner, Therapist, sports, BSN [employer] [phone]

## 2022-09-11 NOTE — Progress Notes (Signed)
Daily Progress Note   Patient Name: Natalie Hinton       Date: 09/11/2022 DOB: Nov 29, 1985  Age: 37 y.o. MRN#: [id number] Attending Physician: Geradine Girt, DO Primary Care Physician: [name], MD Admit Date: 09/08/2022  Reason for Consultation/Follow-up: Goals of care clarification, patient with widely metastatic disease  Patient Profile/HPI: 37 y.o. 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 She was recently hospitalized at [facility]  08/07/22 - 08/16/22 with acute encephalopathy and was found to have brain mets. She underwent palliative radiation (completed 08/23/22).    She presented to [facility] ED 09/08/22 with generalized weakness and was found to have hypoglycemia, dehydration, hypercalcemia, hypotension, and lactic acidosis.    Palliative Medicine was consulted for goals of care in the setting of metastatic cancer.   Subjective: Extensive medical record review. Discussion with attending, palliative care team, and bedside nursing staff prior to visit with patient.  Entered room and Jilian is observed alert. She recognizes this student from her previous visits in the [facility] palliative care clinic and expresses appreciation. It is noted that patient is much thinner and has experienced functional decline over the past week. She is unable to ambulate due to weakness and dyspnea.  Visit is kept short due to patient's difficulty breathing. Respirations are rapid and accessory muscle use is noted. Patient is reassured that team will make changes to help keep her comfortable. Education provided that the same medications that are used to treat pain will be used to help her breathing. Sandhya has oxygen in place and this is relative new for  her.  Confirmed with Calleen that going home with hospice support is her desire. She is requesting [facility]. Reviewed DME needs. Annabell will need oxygen, a hospital bed with 4 rails (patient states she needs them to help her raise up-this is very important to her), and a shower chair. Patient states she already has a bedside commode.  Emotional support provided to patient through quiet presence. She is open to a hug and begins to cry and say thank you. Aisia is reassured that palliative care will remain available to support her throughout this hospitalization.  Attempted to reach family. Calls placed to husband, mother, and cousin without answer.  Review of Systems  Respiratory:  Positive for  shortness of breath.     Physical Exam Constitutional:      Appearance: She is cachectic. She is ill-appearing.  Pulmonary:     Effort: Tachypnea and accessory muscle usage present.     Breath sounds: Decreased air movement present.  Neurological:     Mental Status: She is alert.  Psychiatric:        Mood and Affect: Affect is tearful.            Vital Signs: BP 114/77   Pulse (!) 102   Temp 98 F (36.7 C) (Oral)   Resp (!) 21   Ht 5' (1.524 m)   Wt 37.5 kg   SpO2 90%   BMI 16.17 kg/m  SpO2: SpO2: 90 % O2 Device: O2 Device: Room Air O2 Flow Rate: O2 Flow Rate (L/min): 2 L/min  Intake/output summary:  Intake/Output Summary (Last 24 hours) at 09/11/2022 0910 Last data filed at 09/11/2022 0000 Gross per 24 hour  Intake 1320 ml  Output 2500 ml  Net -1180 ml   LBM: Last BM Date : 09/11/22 Baseline Weight: Weight: 40.8 kg Most recent weight: Weight: 37.5 kg       Palliative Assessment/Data: 20%      Patient Active Problem List   Diagnosis Date Noted   Fall at home, initial encounter 09/09/2022   Hypoglycemia 09/09/2022   SIRS (systemic inflammatory response syndrome) ([place]) 09/09/2022   Acute hypoxic respiratory failure ([place]) 09/09/2022   Acute hyponatremia 09/09/2022    Dehydration 09/09/2022   Hypercalcemia 09/09/2022   Lactic acidosis 09/09/2022   Prolonged QT interval 09/09/2022   Generalized weakness 09/08/2022   Fever 08/10/2022   Cancer related pain 08/10/2022   Internal carotid artery stenosis, bilateral 08/10/2022   Protein-calorie malnutrition, severe 08/09/2022   Metastatic cancer ([place]) 08/08/2022   Acute encephalopathy 08/08/2022   Genetic testing 08/04/2022   Pelvic mass 07/20/2022    Palliative Care Assessment & Plan    Assessment/Recommendations/Plan  Change PRN Dilaudid IV frequency and add indication for dyspnea. Dilaudid 0.5 mg IV q 2 hr PRN pain or dyspnea. (This originally had been scaled back in preparation for patient to go home - adjustments made however due to current symptom burden). Discontinue PRN Dilaudid PO.  Begin Morphine 10 mg PO q 3 hr PRN pain or dyspnea. Anticipate continued rapid decline and difficulty swallowing. Encouraged patient to try this and see how it works over the next 24 hours. Add indication to PRN Ativan to use for dyspnea despite opiates. [facility] Transitions of Care and [facility] notified of patient's desire to go home with hospice. Identified DME needs include: oxygen, hospital bed with 4 rails, and shower chair.   Code Status: DNR  Prognosis: Days to weeks   Discharge Planning: Home with Hospice  Care plan was discussed with patient, attending, palliative care team, transitions of care team, hospice liaison, and bedside nursing staff.   Thank you for allowing the Palliative Medicine Team to assist in the care of this patient.  Signed by: Moss Mc, RN MSN [facility] / NP Student  If patient remains symptomatic despite maximum doses, please call PMT at [phone] between 0700 and 1900. Outside of these hours, please call attending, as PMT does not have night coverage.    Addendum: Patient seen and examined, discussed with Levada Dy, discussed with PMT Gregary Signs NP as well on 09-10-22, checked in  with Dr Eliseo Squires [facility]. Discussed in detail with Levada Dy regarding adjusting pain medications, plan for home with hospice once arrangements complete  and once patient's symptoms are better controlled. BP 114/77   Pulse (!) 102   Temp 98 F (36.7 C) (Oral)   Resp (!) 21   Ht 5' (1.524 m)   Wt 37.5 kg   SpO2 90%   BMI 16.17 kg/m  Labs and imaging reviewed.  Regular work of breathing Mild distress Frail and cancer cachexia evident.  Plan: Home with hospice.  Appropriate medication orders entered.  Mod MDM Loistine Chance MD [place] palliative.

## 2022-09-11 NOTE — Progress Notes (Signed)
PROGRESS NOTE    Natalie Hinton  [id number] DOB: 08-17-1985 DOA: 09/08/2022 PCP: [name], MD    Brief Narrative:  Natalie Hinton is a 37 y.o. 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  During last hospitalization hospice/comfort care was recommended.  Plan now is home with hospice.   Assessment and Plan:  Generalized weakness:  -multifactorial -does not sound like seizures as she was coherent through all of the episodes -plan is home with hospice    Hypoglycemia: Initial CBG noted to be 58, subsequently improving to greater than 200 following interval amp of D50.  -improved   SIRS criteria present: Presentation associated with leukocytosis, tachycardia, tachypnea.  However, in the absence of e/o underlying infection at this time, including, chest x-ray showing no evidence of infiltrate, along with COVID, influenza, RSV PCR all negative, criteria for sepsis not currently met.  suspect non-infectious factors contributing to these SIRS findings, including inflammatory/steroid off exacerbation of existing leukocytosis dating back to February 2024.   -d/c abx  -right eye swelling better  Acute hypoxic respiratory distress: in the context of presenting mild shortness of breath, will albeit with a element of chronicity to this, and no known baseline supplemental O2 requirements, presenting O2 sat note to be in the high 80s- improving into the mid to high 90s on 2 L nasal cannula, thereby meeting criteria for acute hypoxic respiratory distress as opposed to acute hypoxic respiratory failure at this time.  -Appears to be on basis of known diffusely metastatic disease to the lungs, with potential contribution from  interval identification of right hemidiaphragm, which, per radiology read, appears consistent with known mediastinal disease.      Acute hypo-osmolar Hypovolemic hyponatremia:  -improved with IVF/albumin    Dehydration:  -in the setting of   recent decline in oral intake.     Hypercalcemia: Presenting labs reflect serum calcium adjusted for concomitant hypoalbuminemia, noted to be 10.9.  Suspect element of dehydration, without overt pharmacologic contribution.   -improved with IVF      Lactic Acidosis: Initial lactate noted to be 3.4, with repeat lactic acid level pending following interval IV fluids.  Suspect that this is multifactorial in nature, with likely contributions from underlying malignancy, dehydration, and relative generalized tissue hypoperfusion given presenting initial soft blood pressures -continue IVF    QTc prolongation: Presenting EKG demonstrates QTc of 566 ms. outpatient medications that may be contributing to QTc prolongation: none.  -monitor on tele    Diffusely metastatic disease: Recently discovered pelvic mass with diffusely metastatic disease to brain, bone, lungs, undergoing palliative radiation, followed by gynecologic and radiation oncology, as further detailed above.  Per her radiation oncologist, she has been undergoing a Decadron taper over the last 3 weeks, with 3 remaining doses of this Decadron taper noted.  Patient, her family, notes that the patient is DNR.   -palliative care consult -resume home pain meds    Protein calorie malnutrition: patient noted weigh 40.8 kg with associated bmi of 17.6 at time of this presentation, with contributions from recent decline in oral intake -nutrition consult.   Hypokalemia -replete    pregnancy test: Hcg: 12- ? From cancer    palliative care consult for poor overall prognosis- recommend hospice-- patient willing to do  hospice at home     DVT prophylaxis: SCDs Start: 09/08/22 2355    Code Status:  DNR   Disposition Plan:  Level of care: Med-Surg Status is: Inpatient Remains inpatient appropriate because: home with hospice once arranged    Consultants:  Palliative care TOC    Subjective: Wants to go home with hospice- does not want any more labs  Objective: Vitals:   09/10/22 2239 09/11/22 0500 09/11/22 0555 09/11/22 1025  BP: (!) 142/89  114/77 113/81  Pulse: (!) 112  (!) 102 (!) 104  Resp: 20  (!) 21 (!) 21  Temp: 98 F (36.7 C)  98 F (36.7 C) 98.7 F (37.1 C)  TempSrc: Oral  Oral Oral  SpO2: 100%  90% 98%  Weight:  37.5 kg    Height:        Intake/Output Summary (Last 24 hours) at 09/11/2022 1051 Last data filed at 09/11/2022 1043 Gross per 24 hour  Intake 1380 ml  Output 2500 ml  Net -1120 ml   Filed Weights   09/09/22 0214 09/10/22 0425 09/11/22 0500  Weight: 39.8 kg 37.1 kg 37.5 kg    Examination:   General: Appearance:    Thin/frail female in no acute distress     Lungs:      respirations unlabored  Heart:    Tachycardic.   MS:   All extremities are intact.   Neurologic:   Awake, alert       Data Reviewed: I have personally reviewed following labs and imaging studies  CBC: Recent Labs  Lab 09/08/22 2209 09/09/22 0638  WBC 41.4* 37.1*  NEUTROABS 36.9* 33.5*  HGB 11.6* 9.5*  HCT 34.8* 28.9*  MCV 84.9 86.0  PLT 305 [id number]   Basic Metabolic Panel: Recent Labs  Lab 09/08/22 0440 09/08/22 2209 09/09/22 0528 09/10/22 0553  NA  --  126* 130* 141  K  --  3.5 3.4* 3.1*  CL  --  85* 94* 101  CO2  --  '27 22 27  '$ GLUCOSE  --  58* 55* 118*  BUN  --  '20 17 16  '$ CREATININE  --  0.79 0.64 0.64  CALCIUM  --  9.7 9.0 9.4  MG 2.0  --  1.9  --   PHOS  --   --  4.5  --    GFR: Estimated Creatinine Clearance: 57.7 mL/min (by C-G formula based on SCr of 0.64 mg/dL). Liver Function Tests: Recent Labs  Lab 09/08/22 2209 09/09/22 0528  AST 42* 44*  ALT 17 14  ALKPHOS 216* 179*  BILITOT 0.7 0.8  PROT 6.4* 5.7*  ALBUMIN 2.5* 2.2*   No  results for input(s): "LIPASE", "AMYLASE" in the last 168 hours. No results for input(s): "AMMONIA" in the last 168 hours. Coagulation Profile: Recent Labs  Lab 09/08/22 2209  INR 1.2   Cardiac Enzymes: No results for input(s): "CKTOTAL", "CKMB", "CKMBINDEX", "TROPONINI" in the last 168 hours. BNP (last 3 results) No results for input(s): "PROBNP" in the last 8760 hours. HbA1C: Recent Labs    09/09/22 0638  HGBA1C 5.8*   CBG: Recent Labs  Lab 09/08/22 2143 09/08/22 2213 09/09/22 0120 09/09/22 0830 09/09/22 1252  GLUCAP 58* 209* 80 82 93   Lipid Profile: No results for input(s): "CHOL", "HDL", "LDLCALC", "TRIG", "CHOLHDL", "LDLDIRECT" in the last 72 hours. Thyroid Function Tests: Recent Labs    09/09/22 0051  TSH 0.621   Anemia Panel: No results for input(s): "VITAMINB12", "FOLATE", "FERRITIN", "TIBC", "IRON", "RETICCTPCT" in  the last 72 hours. Sepsis Labs: Recent Labs  Lab 09/08/22 0440 09/08/22 2209 09/09/22 0528 09/10/22 0553  PROCALCITON 8.05  --   --   --   LATICACIDVEN  --  3.4* 2.9* 2.1*    Recent Results (from the past 240 hour(s))  Culture, blood (Routine x 2)     Status: None (Preliminary result)   Collection Time: 09/08/22  9:48 PM   Specimen: BLOOD  Result Value Ref Range Status   Specimen Description   Final    BLOOD BLOOD RIGHT ARM Performed at [facility] [address], [place], [place] [address]    Special Requests   Final    BOTTLES DRAWN AEROBIC AND ANAEROBIC Blood Culture results may not be optimal due to an excessive volume of blood received in culture bottles Performed at [facility] [address] [place], [place] [address]    Culture   Final    NO GROWTH 3 DAYS Performed at [facility], [place] [address] [place], [place] [address]    Report Status PENDING  Incomplete  Culture, blood (Routine x 2)     Status: None (Preliminary result)   Collection Time: 09/08/22 10:03 PM    Specimen: BLOOD  Result Value Ref Range Status   Specimen Description   Final    BLOOD RIGHT ANTECUBITAL Performed at [facility] [address] [place], [place] [address]    Special Requests   Final    BOTTLES DRAWN AEROBIC AND ANAEROBIC Blood Culture results may not be optimal due to an excessive volume of blood received in culture bottles Performed at [facility] [address], [place], [place] [address]    Culture   Final    NO GROWTH 3 DAYS Performed at [facility], [place] [address], [place], [place] [address]    Report Status PENDING  Incomplete  Resp panel by RT-PCR (RSV, Flu A&B, Covid) Anterior Nasal Swab     Status: None   Collection Time: 09/08/22 10:21 PM   Specimen: Anterior Nasal Swab  Result Value Ref Range Status   SARS Coronavirus 2 by RT PCR NEGATIVE NEGATIVE Final    Comment: (NOTE) SARS-CoV-2 target nucleic acids are NOT DETECTED.  The SARS-CoV-2 RNA is generally detectable in upper respiratory specimens during the acute phase of infection. The lowest concentration of SARS-CoV-2 viral copies this assay can detect is 138 copies/mL. A negative result does not preclude SARS-Cov-2 infection and should not be used as the sole basis for treatment or other patient management decisions. A negative result may occur with  improper specimen collection/handling, submission of specimen other than nasopharyngeal swab, presence of viral mutation(s) within the areas targeted by this assay, and inadequate number of viral copies(<138 copies/mL). A negative result must be combined with clinical observations, patient history, and epidemiological information. The expected result is Negative.  Fact Sheet for Patients:  EntrepreneurPulse.com.au  Fact Sheet for Healthcare Providers:  IncredibleEmployment.be  This test is no t yet approved or cleared by the [place] FDA and  has been  authorized for detection and/or diagnosis of SARS-CoV-2 by FDA under an Emergency Use Authorization (EUA). This EUA will remain  in effect (meaning this test can be used) for the duration of the COVID-19 declaration under Section 564(b)(1) of the Act, 21 U.S.C.section 360bbb-3(b)(1), unless the authorization is terminated  or revoked sooner.       Influenza A by PCR NEGATIVE NEGATIVE Final  Influenza B by PCR NEGATIVE NEGATIVE Final    Comment: (NOTE) The Xpert Xpress SARS-CoV-2/FLU/RSV plus assay is intended as an aid in the diagnosis of influenza from Nasopharyngeal swab specimens and should not be used as a sole basis for treatment. Nasal washings and aspirates are unacceptable for Xpert Xpress SARS-CoV-2/FLU/RSV testing.  Fact Sheet for Patients: EntrepreneurPulse.com.au  Fact Sheet for Healthcare Providers: IncredibleEmployment.be  This test is not yet approved or cleared by the [place] FDA and has been authorized for detection and/or diagnosis of SARS-CoV-2 by FDA under an Emergency Use Authorization (EUA). This EUA will remain in effect (meaning this test can be used) for the duration of the COVID-19 declaration under Section 564(b)(1) of the Act, 21 U.S.C. section 360bbb-3(b)(1), unless the authorization is terminated or revoked.     Resp Syncytial Virus by PCR NEGATIVE NEGATIVE Final    Comment: (NOTE) Fact Sheet for Patients: EntrepreneurPulse.com.au  Fact Sheet for Healthcare Providers: IncredibleEmployment.be  This test is not yet approved or cleared by the [place] FDA and has been authorized for detection and/or diagnosis of SARS-CoV-2 by FDA under an Emergency Use Authorization (EUA). This EUA will remain in effect (meaning this test can be used) for the duration of the COVID-19 declaration under Section 564(b)(1) of the Act, 21 U.S.C. section 360bbb-3(b)(1), unless the  authorization is terminated or revoked.  Performed at [facility], [place] [address], [place], [place] [address]   Urine Culture     Status: None   Collection Time: 09/09/22 12:30 AM   Specimen: Urine, Random  Result Value Ref Range Status   Specimen Description   Final    URINE, RANDOM Performed at [facility] [address], [place], [place] [address]    Special Requests   Final    NONE Reflexed from [id number] Performed at [facility], [place] [address], [place], [place] [address]    Culture   Final    NO GROWTH Performed at [facility], [place] [address], [place], [place] [address]    Report Status 09/10/2022 FINAL  Final         Radiology Studies: DG CHEST PORT 1 VIEW  Result Date: 09/09/2022 CLINICAL DATA:  Aspiration EXAM: PORTABLE CHEST 1 VIEW COMPARISON:  CXR 09/08/22 FINDINGS: No pleural effusion. No pneumothorax. Redemonstrated bilateral nodular pulmonary opacities, compatible with known metastatic disease. Unchanged asymmetric elevation of the right hemidiaphragm. No new focal airspace opacity. No radiographically apparent displaced rib fractures. Visualized upper abdomen is unremarkable. IMPRESSION: 1. Redemonstrated bilateral nodular pulmonary opacities, compatible with known metastatic disease. 2. No new focal airspace opacity. Electronically Signed   By: Marin Roberts M.D.   On: 09/09/2022 16:18        Scheduled Meds:  Chlorhexidine Gluconate Cloth  6 each Topical Daily   dexamethasone  2 mg Oral Q48H   feeding supplement  237 mL Oral TID BM   magic mouthwash  5 mL Oral QID   megestrol  160 mg Oral Daily   morphine  15 mg Oral Q12H   multivitamin with minerals  1 tablet Oral Daily   senna-docusate  1 tablet Oral BID   Continuous Infusions:     LOS: 3 days    Time spent: 45 minutes spent on chart review, discussion with nursing staff, consultants, updating family and interview/physical exam; more  than 50% of that time was spent in counseling and/or coordination of care.    Geradine Girt, DO [facility]  Available via Epic secure chat 7am-7pm After these hours, please refer to coverage provider listed on [email or web address] 09/11/2022, 10:51 AM

## 2022-09-12 DIAGNOSIS — R531 Weakness: Secondary | ICD-10-CM | POA: Diagnosis not present

## 2022-09-12 MED ORDER — ORAL CARE MOUTH RINSE: 15.0000 mL | OROMUCOSAL | Status: DC | PRN

## 2022-09-12 MED ORDER — LORAZEPAM 2 MG/ML IJ SOLN: 0.5000 mg | Freq: Four times a day (QID) | INTRAMUSCULAR | 0 refills | Status: DC | PRN

## 2022-09-12 MED ORDER — HYDROMORPHONE HCL 1 MG/ML IJ SOLN: 1.0000 mg | INTRAMUSCULAR | 0 refills | Status: DC | PRN

## 2022-09-12 MED ORDER — DEXAMETHASONE 2 MG PO TABS: 2.0000 mg | ORAL_TABLET | ORAL | Status: DC

## 2022-09-12 MED ORDER — MORPHINE SULFATE (CONCENTRATE) 10 MG/0.5ML PO SOLN: 10.0000 mg | ORAL | 0 refills | Status: DC | PRN

## 2022-09-12 MED ORDER — MAGIC MOUTHWASH: 5.0000 mL | Freq: Four times a day (QID) | ORAL | 0 refills | Status: DC

## 2022-09-12 MED ORDER — HYDROMORPHONE HCL 1 MG/ML IJ SOLN
1.0000 mg | INTRAMUSCULAR | Status: DC | PRN
  Administered 2022-09-12 (×3): 1 mg via INTRAVENOUS
  Filled 2022-09-12 (×3): qty 1

## 2022-09-12 NOTE — Progress Notes (Signed)
Daily Progress Note   Patient Name: Natalie Hinton       Date: 09/12/2022 DOB: [birth year]  Age: 37 y.o. MRN#: [id number] Attending Physician: Geradine Girt, DO Primary Care Physician: [name], MD Admit Date: 09/08/2022  Reason for Consultation/Follow-up: Goals of care clarification, patient with widely metastatic disease  Patient Profile/HPI: 37 y.o. 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 She was recently hospitalized at [facility]  08/07/22 - 08/16/22 with acute encephalopathy and was found to have brain mets. She underwent palliative radiation (completed 08/23/22).    She presented to [facility] ED 09/08/22 with generalized weakness and was found to have hypoglycemia, dehydration, hypercalcemia, hypotension, and lactic acidosis.    Palliative Medicine was consulted for goals of care in the setting of metastatic cancer.   Subjective: Extensive medical record review. Discussion with attending, palliative care team, and bedside nursing staff prior to visit with patient.  Patient is resting in bed, has uncontrolled pain, cousin at bedside.   5 mg IV Dilaudid use in the past 24 hours.  MS Contin 15 mg PO BID MSIR 10 mg PO: took 3 doses in past 24 hours.   Ongoing concerns regarding the patient's worsening symptom burden have led to discussions about whether residential hospice for aggressive symptom management might be more prudent at this stage.    Review of Systems  Respiratory:  Positive for shortness of breath.     Physical Exam Constitutional:      Appearance: She is cachectic. She is ill-appearing.  Pulmonary:     Effort: Tachypnea and accessory muscle usage present.     Breath sounds: Decreased air movement present.   Neurological:     Mental Status: She is alert.  Psychiatric:        Mood and Affect: Affect is tearful.   R eye closed shut          Vital Signs: BP 117/86 (BP Location: Left Arm)   Pulse (!) 106   Temp 99 F (37.2 C) (Oral)   Resp 16   Ht 5' (1.524 m)   Wt 37.9 kg   SpO2 96%   BMI 16.30 kg/m  SpO2: SpO2: 96 % O2 Device: O2 Device: Nasal Cannula O2 Flow Rate: O2 Flow Rate (L/min): 2 L/min  Intake/output summary:  Intake/Output Summary (Last 24 hours) at 09/12/2022 1019 Last data filed at 09/12/2022 0900 Gross per 24 hour  Intake 1560 ml  Output 5075 ml  Net -3515 ml    LBM: Last BM Date : 09/11/22 Baseline Weight: Weight: 40.8 kg Most recent weight: Weight: 37.9 kg       Palliative Assessment/Data: 20%      Patient Active Problem List   Diagnosis Date Noted   Fall at home, initial encounter 09/09/2022   Hypoglycemia 09/09/2022   SIRS (systemic inflammatory response syndrome) ([place]) 09/09/2022   Acute hypoxic respiratory failure ([place]) 09/09/2022   Acute hyponatremia 09/09/2022   Dehydration 09/09/2022   Hypercalcemia 09/09/2022   Lactic acidosis 09/09/2022   Prolonged QT interval 09/09/2022   Generalized weakness 09/08/2022   Fever 08/10/2022   Cancer related pain 08/10/2022   Internal carotid artery stenosis, bilateral 08/10/2022   Protein-calorie malnutrition, severe 08/09/2022   Metastatic cancer ([place]) 08/08/2022   Acute encephalopathy 08/08/2022   Genetic testing 08/04/2022   Pelvic mass 07/20/2022    Palliative Care Assessment & Plan    Assessment/Recommendations/Plan  Increase PRN Dilaudid IV dose for pain and dyspnea. Dilaudid 1-2 mg IV q 2 hr PRN pain or dyspnea.   Continue Morphine 10 mg PO q 3 hr PRN pain or dyspnea. Anticipate continued rapid decline and difficulty swallowing. Encouraged patient to try this and see how it works over the next 24 hours. Consider residential hospice.      Code Status: DNR  Prognosis: Days to less  than 2 weeks   Discharge Planning: Residential hospice.   Care plan was discussed with patient, cousin,attending, palliative care team, transitions of care team, hospice liaison, and bedside nursing staff.   Thank you for allowing the Palliative Medicine Team to assist in the care of this patient.  Signed by:   Mod MDM Loistine Chance MD [place] [facility].

## 2022-09-12 NOTE — Discharge Summary (Signed)
Physician Discharge Summary  Ardyn M Boomsma [id number] DOB: 11-26-1985 DOA: 09/08/2022  PCP: [name], MD  Admit date: 09/08/2022 Discharge date: 09/12/2022  Admitted From: home Discharge disposition: residential hospice   Recommendations for Outpatient Follow-Up:   [place] [facility]   Discharge Diagnosis:   Principal Problem:   Generalized weakness Active Problems:   Metastatic cancer ([place])   Pelvic mass   Protein-calorie malnutrition, severe   Fall at home, initial encounter   Hypoglycemia   SIRS (systemic inflammatory response syndrome) (HCC)   Acute hypoxic respiratory failure (HCC)   Acute hyponatremia   Dehydration   Hypercalcemia   Lactic acidosis   Prolonged QT interval    Discharge Condition: terminal  Diet recommendation:  Regular.   Code status: DNR   History of Present Illness:   Natalie Hinton is a 37 y.o. 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    The patient notes progressive generalized weakness over the course the last week, in the absence of any associated acute focal weakness or any associated acute focal numbness, paresthesias.  Denies any associated subjective fever, chills, rigors, or generalized myalgias.  Reports baseline mild shortness of breath in the setting of known metastatic disease to the lungs, but without any significant recent worsening thereof.  She also denies any recent worsening of her mild nonproductive cough, and notes no recent hemoptysis.  No known baseline supplemental oxygen requirement.  No rash or new abdominal discomfort, denies any recent diarrhea, but does note 3 to 4 days of dysuria in the absence of any gross hematuria or  change in urinary urgency/frequency.  In the setting of her generalized weakness, she experienced a ground-level fall, tripping while attempting to ambulate at home.  No reported associated loss of consciousness or hitting of the head.  Denies any acute arthralgias or myalgias as a consequence of this fall.  She also denies any new headache or neck pain.   Recently diagnosed with pelvic mass and diffusely metastatic disease, including mets to the brain to bone, including her pelvis, in addition to mets to the lungs.  She follows with Dr. Bernadene Bell Of gyn-onc as well as Dr. Sondra Come of radiation oncology, having reportedly underwent most recent palliative radiation session a few weeks ago, which was focused on her mets to the pelvis.   In the setting of known metastatic disease to the brain, she is currently on a Decadron taper, with residual taper noted to consist of 2 mg every 48 hours, with final dose to occur on 09/13/2022.     Hospital Course by Problem:   Generalized weakness:  -multifactorial -does not sound like seizures as she was coherent through all of the episodes -plan per hospice- residential hospice as now comfort focused care    Hypoglycemia: Initial CBG noted to be 58, subsequently improving to greater than 200 following interval amp of D50.  -resolved   SIRS criteria present: Presentation associated with leukocytosis, tachycardia, tachypnea.  However, in the absence of e/o underlying infection at this time, including, chest x-ray showing no evidence of infiltrate, along with COVID, influenza, RSV PCR all negative, criteria for sepsis not currently met.  suspect non-infectious factors contributing to these SIRS findings, including inflammatory/steroid off exacerbation  of existing leukocytosis dating back to February 2024.   -d/c abx as cultures negative    Acute hypoxic respiratory distress: in the context of presenting mild shortness of breath, will albeit with a element of  chronicity to this, and no known baseline supplemental O2 requirements, presenting O2 sat note to be in the high 80s- improving into the mid to high 90s on 2 L nasal cannula, thereby meeting criteria for acute hypoxic respiratory distress as opposed to acute hypoxic respiratory failure at this time.  -Appears to be on basis of known diffusely metastatic disease to the lungs, with potential contribution from interval identification of right hemidiaphragm, which, per radiology read, appears consistent with known mediastinal disease.      Acute hypo-osmolar Hypovolemic hyponatremia:  -improved with IVF/albumin    Dehydration:  -in the setting of   recent decline in oral intake.   -now comfort focused care    Hypercalcemia: Presenting labs reflect serum calcium adjusted for concomitant hypoalbuminemia, noted to be 10.9.  Suspect element of dehydration, without overt pharmacologic contribution.   -improved with IVF      Lactic Acidosis: Initial lactate noted to be 3.4, with repeat lactic acid level pending following interval IV fluids.  Suspect that this is multifactorial in nature, with likely contributions from underlying malignancy, dehydration, and relative generalized tissue hypoperfusion given presenting initial soft blood pressures    QTc prolongation: Presenting EKG demonstrates QTc of 566 ms. outpatient medications that may be contributing to QTc prolongation: none.     Diffusely metastatic disease: Recently discovered pelvic mass with diffusely metastatic disease to brain, bone, lungs, undergoing palliative radiation, followed by gynecologic and radiation oncology, as further detailed above.  Per her radiation oncologist, she has been undergoing a Decadron taper over the last 3 weeks, with 3 remaining doses of this Decadron taper noted.  Patient, her family, notes that the patient is DNR.   -palliative care consult -resume home pain meds and adjust for better control    Protein calorie  malnutrition: patient noted weigh 40.8 kg with associated bmi of 17.6 at time of this presentation, with contributions from recent decline in oral intake -now comfort care   Hypokalemia -repleted    Medical Consultants:   Palliative care   Discharge Exam:   Vitals:   09/11/22 2027 09/12/22 0421  BP: 115/86 117/86  Pulse: (!) 102 (!) 106  Resp: 16 16  Temp: 98.2 F (36.8 C) 99 F (37.2 C)  SpO2: 100% 96%   Vitals:   09/11/22 1504 09/11/22 2027 09/12/22 0421 09/12/22 0500  BP: 118/86 115/86 117/86   Pulse: (!) 103 (!) 102 (!) 106   Resp: '20 16 16   '$ Temp: 98.8 F (37.1 C) 98.2 F (36.8 C) 99 F (37.2 C)   TempSrc: Oral Oral Oral   SpO2: 97% 100% 96%   Weight:    37.9 kg  Height:        General exam: Appears calm and comfortable.    The results of significant diagnostics from this hospitalization (including imaging, microbiology, ancillary and laboratory) are listed below for reference.     Procedures and Diagnostic Studies:   DG CHEST PORT 1 VIEW  Result Date: 09/09/2022 CLINICAL DATA:  Aspiration EXAM: PORTABLE CHEST 1 VIEW COMPARISON:  CXR 09/08/22 FINDINGS: No pleural effusion. No pneumothorax. Redemonstrated bilateral nodular pulmonary opacities, compatible with known metastatic disease. Unchanged asymmetric elevation of the right hemidiaphragm. No new focal airspace opacity. No radiographically apparent displaced rib fractures. Visualized upper  abdomen is unremarkable. IMPRESSION: 1. Redemonstrated bilateral nodular pulmonary opacities, compatible with known metastatic disease. 2. No new focal airspace opacity. Electronically Signed   By: Marin Roberts M.D.   On: 09/09/2022 16:18   CT Head Wo Contrast  Result Date: 09/08/2022 CLINICAL DATA:  Known sellar mass. History of unspecified metastatic cancer. Seizure activity earlier today. Frequent falls. EXAM: CT HEAD WITHOUT CONTRAST TECHNIQUE: Contiguous axial images were obtained from the base of the skull through the  vertex without intravenous contrast. RADIATION DOSE REDUCTION: This exam was performed according to the departmental dose-optimization program which includes automated exposure control, adjustment of the mA and/or kV according to patient size and/or use of iterative reconstruction technique. COMPARISON:  MRI without and with contrast 08/09/2022, CT without contrast 08/07/2022. FINDINGS: Brain: Again noted is a heterogeneous sellar mass, grossly unchanged in size estimated to measure 2.1 x 1.5 x 1.3 cm. There is again noted invasion of the cavernous sinuses, erosion through the sellar floor and protrusion into superior right sphenoid sinus. Findings of thickening of the overlap pituitary stalk and protrusion into the suprasellar cistern or better demonstrated on MRI. The cerebral and cerebellar hemispheres, midbrain and brainstem show no evidence of an acute infarct, hemorrhage or mass. The ventricles are normal in size and position. Basal cisterns are clear. Vascular: Narrowing due to mass effect of both cavernous ICAs was noted on MRI but is not well seen on CT. There are no hyperdense central vessel. Skull: Negative for fractures or focal lesions. No scalp hematoma is evident. Sinuses/Orbits: Negative visualized orbits. There is interval increased opacification of the right sphenoid air cell underlying the invading tumor. This appears to be primarily due to fluid and membrane thickening with mild membrane disease in the ethmoid and left sphenoid sinuses. Other: None. IMPRESSION: 1. No acute intracranial CT findings or interval changes. 2. 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 3. Increased opacification of the right sphenoid air cell underlying the invading tumor. This appears to be primarily due to fluid and  membrane thickening with mild membrane disease in the ethmoid and left sphenoid sinuses. 4. Narrowing due to mass effect of both cavernous ICAs was noted on MRI but is not well seen on CT. There are no hyperdense central vessels. Electronically Signed   By: Telford Nab M.D.   On: 09/08/2022 23:23   DG Chest Port 1 View  Result Date: 09/08/2022 CLINICAL DATA:  Seizure-like activity, sepsis, history of metastatic disease of unknown primary EXAM: PORTABLE CHEST 1 VIEW COMPARISON:  08/07/2022 FINDINGS: Scattered pulmonary nodules are again identified and stable. Cardiac shadow is within normal limits. Left lung is well aerated. Right lung shows elevation of the right hemidiaphragm new from the prior exam. No acute bony abnormality is noted. 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 New elevation of the right hemidiaphragm is noted. This may be related to the known mediastinal disease. Electronically Signed   By: Inez Catalina M.D.   On: 09/08/2022 22:26     Labs:   Basic Metabolic Panel: Recent Labs  Lab 09/08/22 0440 09/08/22 2209 09/08/22 2209 09/09/22 0528 09/10/22 0553  NA  --  126*  --  130* 141  K  --  3.5   < > 3.4* 3.1*  CL  --  85*  --  94* 101  CO2  --  27  --  22 27  GLUCOSE  --  58*  --  55* 118*  BUN  --  20  --  17 16  CREATININE  --  0.79  --  0.64 0.64  CALCIUM  --  9.7  --  9.0 9.4  MG 2.0  --   --  1.9  --   PHOS  --   --   --  4.5  --    < > = values in this interval not displayed.   GFR Estimated Creatinine Clearance: 58.2 mL/min (by C-G formula based on SCr of 0.64 mg/dL). Liver Function Tests: Recent Labs  Lab 09/08/22 2209 09/09/22 0528  AST 42* 44*  ALT 17 14  ALKPHOS 216* 179*  BILITOT 0.7 0.8  PROT 6.4* 5.7*  ALBUMIN 2.5* 2.2*   No results for input(s): "LIPASE", "AMYLASE" in the last 168 hours. No results for input(s): "AMMONIA" in the last 168 hours. Coagulation profile Recent Labs  Lab 09/08/22 2209  INR 1.2     CBC: Recent Labs  Lab 09/08/22 2209 09/09/22 0638  WBC 41.4* 37.1*  NEUTROABS 36.9* 33.5*  HGB 11.6* 9.5*  HCT 34.8* 28.9*  MCV 84.9 86.0  PLT 305 265   Cardiac Enzymes: No results for input(s): "CKTOTAL", "CKMB", "CKMBINDEX", "TROPONINI" in the last 168 hours. BNP: Invalid input(s): "POCBNP" CBG: Recent Labs  Lab 09/08/22 2143 09/08/22 2213 09/09/22 0120 09/09/22 0830 09/09/22 1252  GLUCAP 58* 209* 80 82 93   D-Dimer No results for input(s): "DDIMER" in the last 72 hours. Hgb A1c No results for input(s): "HGBA1C" in the last 72 hours. Lipid Profile No results for input(s): "CHOL", "HDL", "LDLCALC", "TRIG", "CHOLHDL", "LDLDIRECT" in the last 72 hours. Thyroid function studies No results for input(s): "TSH", "T4TOTAL", "T3FREE", "THYROIDAB" in the last 72 hours.  Invalid input(s): "FREET3" Anemia work up No results for input(s): "VITAMINB12", "FOLATE", "FERRITIN", "TIBC", "IRON", "RETICCTPCT" in the last 72 hours. Microbiology Recent Results (from the past 240 hour(s))  Culture, blood (Routine x 2)     Status: None (Preliminary result)   Collection Time: 09/08/22  9:48 PM   Specimen: BLOOD  Result Value Ref Range Status   Specimen Description   Final    BLOOD BLOOD RIGHT ARM Performed at [facility] [address], [place], [place] [address]    Special Requests   Final    BOTTLES DRAWN AEROBIC AND ANAEROBIC Blood Culture results may not be optimal due to an excessive volume of blood received in culture bottles Performed at [facility] [address], [place], [place] [address]    Culture   Final    NO GROWTH 4 DAYS Performed at [facility], [place] [address], [place], [place] [address]    Report Status PENDING  Incomplete  Culture, blood (Routine x 2)     Status: None (Preliminary result)   Collection Time: 09/08/22 10:03 PM   Specimen: BLOOD  Result Value Ref Range Status   Specimen Description   Final     BLOOD RIGHT ANTECUBITAL Performed at [facility] [address] [place], [place] [address]    Special Requests   Final    BOTTLES DRAWN AEROBIC AND ANAEROBIC Blood Culture results may not be optimal due to an excessive volume of blood received in culture bottles Performed at [facility] [address] [place], [place] [address]    Culture   Final    NO  GROWTH 4 DAYS Performed at [facility], [place] [address], [place], [place] [address]    Report Status PENDING  Incomplete  Resp panel by RT-PCR (RSV, Flu A&B, Covid) Anterior Nasal Swab     Status: None   Collection Time: 09/08/22 10:21 PM   Specimen: Anterior Nasal Swab  Result Value Ref Range Status   SARS Coronavirus 2 by RT PCR NEGATIVE NEGATIVE Final    Comment: (NOTE) SARS-CoV-2 target nucleic acids are NOT DETECTED.  The SARS-CoV-2 RNA is generally detectable in upper respiratory specimens during the acute phase of infection. The lowest concentration of SARS-CoV-2 viral copies this assay can detect is 138 copies/mL. A negative result does not preclude SARS-Cov-2 infection and should not be used as the sole basis for treatment or other patient management decisions. A negative result may occur with  improper specimen collection/handling, submission of specimen other than nasopharyngeal swab, presence of viral mutation(s) within the areas targeted by this assay, and inadequate number of viral copies(<138 copies/mL). A negative result must be combined with clinical observations, patient history, and epidemiological information. The expected result is Negative.  Fact Sheet for Patients:  EntrepreneurPulse.com.au  Fact Sheet for Healthcare Providers:  IncredibleEmployment.be  This test is no t yet approved or cleared by the [place] FDA and  has been authorized for detection and/or diagnosis of SARS-CoV-2 by FDA under an Emergency Use  Authorization (EUA). This EUA will remain  in effect (meaning this test can be used) for the duration of the COVID-19 declaration under Section 564(b)(1) of the Act, 21 U.S.C.section 360bbb-3(b)(1), unless the authorization is terminated  or revoked sooner.       Influenza A by PCR NEGATIVE NEGATIVE Final   Influenza B by PCR NEGATIVE NEGATIVE Final    Comment: (NOTE) The Xpert Xpress SARS-CoV-2/FLU/RSV plus assay is intended as an aid in the diagnosis of influenza from Nasopharyngeal swab specimens and should not be used as a sole basis for treatment. Nasal washings and aspirates are unacceptable for Xpert Xpress SARS-CoV-2/FLU/RSV testing.  Fact Sheet for Patients: EntrepreneurPulse.com.au  Fact Sheet for Healthcare Providers: IncredibleEmployment.be  This test is not yet approved or cleared by the [place] FDA and has been authorized for detection and/or diagnosis of SARS-CoV-2 by FDA under an Emergency Use Authorization (EUA). This EUA will remain in effect (meaning this test can be used) for the duration of the COVID-19 declaration under Section 564(b)(1) of the Act, 21 U.S.C. section 360bbb-3(b)(1), unless the authorization is terminated or revoked.     Resp Syncytial Virus by PCR NEGATIVE NEGATIVE Final    Comment: (NOTE) Fact Sheet for Patients: EntrepreneurPulse.com.au  Fact Sheet for Healthcare Providers: IncredibleEmployment.be  This test is not yet approved or cleared by the [place] FDA and has been authorized for detection and/or diagnosis of SARS-CoV-2 by FDA under an Emergency Use Authorization (EUA). This EUA will remain in effect (meaning this test can be used) for the duration of the COVID-19 declaration under Section 564(b)(1) of the Act, 21 U.S.C. section 360bbb-3(b)(1), unless the authorization is terminated or revoked.  Performed at [facility],  [place] [address], [place], [place] [address]   Urine Culture     Status: None   Collection Time: 09/09/22 12:30 AM   Specimen: Urine, Random  Result Value Ref Range Status   Specimen Description   Final    URINE, RANDOM Performed at [facility] [address] [place], [place] [address]    Special  Requests   Final    NONE Reflexed from [id number] Performed at [facility], [place] [address], [place], [place] [address]    Culture   Final    NO GROWTH Performed at [facility], [place] [address] [place], [place] [address]    Report Status 09/10/2022 FINAL  Final     Discharge Instructions:   Discharge Instructions     Diet general   Complete by: As directed    Increase activity slowly   Complete by: As directed       Allergies as of 09/12/2022       Reactions   Penicillins Hives   Gabapentin Other (See Comments)   hallucinations        Medication List     STOP taking these medications    fluconazole 100 MG tablet Commonly known as: DIFLUCAN   folic acid 1 MG tablet Commonly known as: FOLVITE   HYDROmorphone 2 MG tablet Commonly known as: DILAUDID Replaced by: HYDROmorphone 1 MG/ML injection   megestrol 40 MG tablet Commonly known as: MEGACE   nicotine 21 mg/24hr patch Commonly known as: NICODERM CQ - dosed in mg/24 hours   ondansetron 8 MG disintegrating tablet Commonly known as: ZOFRAN-ODT   senna-docusate 8.6-50 MG tablet Commonly known as: Senokot-S   thiamine 100 MG tablet Commonly known as: Vitamin B-1       TAKE these medications    dexamethasone 2 MG tablet Commonly known as: DECADRON Take 1 tablet (2 mg total) by mouth every other day. Start taking on: September 13, 2022 What changed:  medication strength how much to take when to take this Another medication with the same name was removed. Continue taking this medication, and follow the directions you see here.   HYDROmorphone 1 MG/ML  injection Commonly known as: DILAUDID Inject 1-2 mLs (1-2 mg total) into the vein every 2 (two) hours as needed for moderate pain or severe pain (or dyspnea). Replaces: HYDROmorphone 2 MG tablet   LORazepam 2 MG/ML injection Commonly known as: ATIVAN Inject 0.25 mLs (0.5 mg total) into the vein every 6 (six) hours as needed (for nausea).   magic mouthwash Soln Take 5 mLs by mouth 4 (four) times daily.   morphine 15 MG 12 hr tablet Commonly known as: MS CONTIN Take 1 tablet (15 mg total) by mouth every 12 (twelve) hours for 27 days. What changed: Another medication with the same name was added. Make sure you understand how and when to take each.   morphine CONCENTRATE 10 MG/0.5ML Soln concentrated solution Take 0.5 mLs (10 mg total) by mouth every 3 (three) hours as needed for severe pain or moderate pain (or dyspnea). What changed: You were already taking a medication with the same name, and this prescription was added. Make sure you understand how and when to take each.   polyethylene glycol 17 g packet Commonly known as: MIRALAX / GLYCOLAX Take 17 g by mouth 2 (two) times daily.               Durable Medical Equipment  (From admission, onward)           Start     Ordered   09/11/22 1049  For home use only DME oxygen  Once       Question Answer Comment  Length of Need Lifetime   Mode or (Route) Nasal cannula   Liters per Minute 2   Oxygen delivery system Gas  09/11/22 1048   09/11/22 1049  For home use only DME Hospital bed  Once       Question Answer Comment  Length of Need Lifetime   Patient has (list medical condition): hospice   The above medical condition requires: Patient requires the ability to reposition immediately   Bed type Semi-electric      09/11/22 1048              Time coordinating discharge: 45 min  Signed:  Geradine Girt DO  [facility] 09/12/2022, 1:11 PM

## 2022-09-12 NOTE — TOC Transition Note (Signed)
Transition of Care [facility]) - CM/SW Discharge Note   Patient Details  Name: Natalie Hinton MRN: [id number] Date of Birth: 1985/12/15  [facility] CM/SW Contact:  Rodney Booze, LCSW Phone Number: 09/12/2022, 1:12 PM   Clinical Narrative:         Barriers to Discharge: Continued Medical Work up, Other (must enter comment) (Awaiting delivery of DME by hospice)   Patient Goals and CMS Choice CMS Medicare.gov Compare Post Acute Care list provided to:: Patient Choice offered to / list presented to : Patient  Discharge Placement  CSW has gotten information that the patient will go to [facility], as the original plan to go home with hospice was not something the family was able to do. The patient will DC later this afternoon. TOC will help facilitate DC.                       Discharge Plan and Services Additional resources added to the After Visit Summary for     Discharge Planning Services: CM Consult Post Acute Care Choice: Hospice (awaiting agency choice)          DME Arranged: Other see comment (Hospice to provide rails for hospital bed, home oxygen, and shower chair.) DME Agency: Other - Comment                  Social Determinants of Health (SDOH) Interventions SDOH Screenings   Food Insecurity: No Food Insecurity (09/10/2022)  Housing: Low Risk  (09/10/2022)  Transportation Needs: Unmet Transportation Needs (09/10/2022)  Utilities: Not At Risk (09/10/2022)  Depression (PHQ2-9): High Risk (07/21/2022)  Tobacco Use: High Risk (09/09/2022)     Readmission Risk Interventions    08/12/2022    3:03 PM  Readmission Risk Prevention Plan  Post Dischage Appt Complete  Medication Screening Complete  Transportation Screening Complete

## 2022-09-12 NOTE — Progress Notes (Signed)
PROGRESS NOTE    Natalie Hinton  [id number] DOB: 1985/11/09 DOA: 09/08/2022 PCP: [name], MD    Brief Narrative:  Natalie Hinton is a 37 y.o. 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  During last hospitalization hospice/comfort care was recommended.  Plan per patient was home with hospice but now she is agreeable to residential hospice   Assessment and Plan:  Generalized weakness:  -multifactorial -does not sound like seizures as she was coherent through all of the episodes -plan per hospice    Hypoglycemia: Initial CBG noted to be 58, subsequently improving to greater than 200 following interval amp of D50.  -improved   SIRS criteria present: Presentation associated with leukocytosis, tachycardia, tachypnea.  However, in the absence of e/o underlying infection at this time, including, chest x-ray showing no evidence of infiltrate, along with COVID, influenza, RSV PCR all negative, criteria for sepsis not currently met.  suspect non-infectious factors contributing to these SIRS findings, including inflammatory/steroid off exacerbation of existing leukocytosis dating back to February 2024.   -d/c abx  -right eye swelling better  Acute hypoxic respiratory distress: in the context of presenting mild shortness of breath, will albeit with a element of chronicity to this, and no known baseline supplemental O2 requirements, presenting O2 sat note to be in the high 80s- improving into the mid to high 90s on 2 L nasal cannula, thereby meeting criteria for acute hypoxic respiratory distress as opposed to acute hypoxic respiratory failure at this time.  -Appears to be on basis of known diffusely metastatic disease to  the lungs, with potential contribution from interval identification of right hemidiaphragm, which, per radiology read, appears consistent with known mediastinal disease.      Acute hypo-osmolar Hypovolemic hyponatremia:  -improved with IVF/albumin    Dehydration:  -in the setting of   recent decline in oral intake.     Hypercalcemia: Presenting labs reflect serum calcium adjusted for concomitant hypoalbuminemia, noted to be 10.9.  Suspect element of dehydration, without overt pharmacologic contribution.   -improved with IVF      Lactic Acidosis: Initial lactate noted to be 3.4, with repeat lactic acid level pending following interval IV fluids.  Suspect that this is multifactorial in nature, with likely contributions from underlying malignancy, dehydration, and relative generalized tissue hypoperfusion given presenting initial soft blood pressures -continue IVF    QTc prolongation: Presenting EKG demonstrates QTc of 566 ms. outpatient medications that may be contributing to QTc prolongation: none.  -monitor on tele    Diffusely metastatic disease: Recently discovered pelvic mass with diffusely metastatic disease to brain, bone, lungs, undergoing palliative radiation, followed by gynecologic and radiation oncology, as further detailed above.  Per her radiation oncologist, she has been undergoing a Decadron taper over the last 3 weeks, with 3 remaining doses of this Decadron taper noted.  Patient, her family, notes that the patient is DNR.   -palliative care consult -resume home pain meds    Protein calorie malnutrition: patient noted weigh 40.8 kg with associated bmi of 17.6 at time of this presentation, with contributions from recent decline in oral intake -nutrition consult.   Hypokalemia -replete    pregnancy test: Hcg: 12- ? From cancer    palliative care consult for poor overall  prognosis- recommend hospice-- ? residential     DVT prophylaxis: SCDs Start: 09/08/22 2355    Code  Status: DNR   Disposition Plan:  Level of care: Med-Surg Status is: Inpatient Remains inpatient appropriate because: hospice    Consultants:  Palliative care TOC    Subjective: Now wants residential hospice  Objective: Vitals:   09/11/22 1504 09/11/22 2027 09/12/22 0421 09/12/22 0500  BP: 118/86 115/86 117/86   Pulse: (!) 103 (!) 102 (!) 106   Resp: '20 16 16   '$ Temp: 98.8 F (37.1 C) 98.2 F (36.8 C) 99 F (37.2 C)   TempSrc: Oral Oral Oral   SpO2: 97% 100% 96%   Weight:    37.9 kg  Height:        Intake/Output Summary (Last 24 hours) at 09/12/2022 1147 Last data filed at 09/12/2022 0900 Gross per 24 hour  Intake 1200 ml  Output 4225 ml  Net -3025 ml   Filed Weights   09/10/22 0425 09/11/22 0500 09/12/22 0500  Weight: 37.1 kg 37.5 kg 37.9 kg    Examination:  In bed, frail appearing-- not eating    Data Reviewed: I have personally reviewed following labs and imaging studies  CBC: Recent Labs  Lab 09/08/22 2209 09/09/22 0638  WBC 41.4* 37.1*  NEUTROABS 36.9* 33.5*  HGB 11.6* 9.5*  HCT 34.8* 28.9*  MCV 84.9 86.0  PLT 305 [id number]   Basic Metabolic Panel: Recent Labs  Lab 09/08/22 0440 09/08/22 2209 09/09/22 0528 09/10/22 0553  NA  --  126* 130* 141  K  --  3.5 3.4* 3.1*  CL  --  85* 94* 101  CO2  --  '27 22 27  '$ GLUCOSE  --  58* 55* 118*  BUN  --  '20 17 16  '$ CREATININE  --  0.79 0.64 0.64  CALCIUM  --  9.7 9.0 9.4  MG 2.0  --  1.9  --   PHOS  --   --  4.5  --    GFR: Estimated Creatinine Clearance: 58.2 mL/min (by C-G formula based on SCr of 0.64 mg/dL). Liver Function Tests: Recent Labs  Lab 09/08/22 2209 09/09/22 0528  AST 42* 44*  ALT 17 14  ALKPHOS 216* 179*  BILITOT 0.7 0.8  PROT 6.4* 5.7*  ALBUMIN 2.5* 2.2*   No results for input(s): "LIPASE", "AMYLASE" in the last 168 hours. No results for input(s): "AMMONIA" in the last 168 hours. Coagulation Profile: Recent Labs  Lab 09/08/22 2209  INR 1.2   Cardiac Enzymes: No  results for input(s): "CKTOTAL", "CKMB", "CKMBINDEX", "TROPONINI" in the last 168 hours. BNP (last 3 results) No results for input(s): "PROBNP" in the last 8760 hours. HbA1C: No results for input(s): "HGBA1C" in the last 72 hours.  CBG: Recent Labs  Lab 09/08/22 2143 09/08/22 2213 09/09/22 0120 09/09/22 0830 09/09/22 1252  GLUCAP 58* 209* 80 82 93   Lipid Profile: No results for input(s): "CHOL", "HDL", "LDLCALC", "TRIG", "CHOLHDL", "LDLDIRECT" in the last 72 hours. Thyroid Function Tests: No results for input(s): "TSH", "T4TOTAL", "FREET4", "T3FREE", "THYROIDAB" in the last 72 hours.  Anemia Panel: No results for input(s): "VITAMINB12", "FOLATE", "FERRITIN", "TIBC", "IRON", "RETICCTPCT" in the last 72 hours. Sepsis Labs: Recent Labs  Lab 09/08/22 0440 09/08/22 2209 09/09/22 0528 09/10/22 0553  PROCALCITON 8.05  --   --   --   LATICACIDVEN  --  3.4* 2.9* 2.1*    Recent Results (from the past 240 hour(s))  Culture, blood (Routine x 2)  Status: None (Preliminary result)   Collection Time: 09/08/22  9:48 PM   Specimen: BLOOD  Result Value Ref Range Status   Specimen Description   Final    BLOOD BLOOD RIGHT ARM Performed at [facility] [address], [place], [place] [address]    Special Requests   Final    BOTTLES DRAWN AEROBIC AND ANAEROBIC Blood Culture results may not be optimal due to an excessive volume of blood received in culture bottles Performed at [facility] [address] [place], [place] [address]    Culture   Final    NO GROWTH 4 DAYS Performed at [facility], [place] [address] [place], [place] [address]    Report Status PENDING  Incomplete  Culture, blood (Routine x 2)     Status: None (Preliminary result)   Collection Time: 09/08/22 10:03 PM   Specimen: BLOOD  Result Value Ref Range Status   Specimen Description   Final    BLOOD RIGHT ANTECUBITAL Performed at [facility] [address], [place], [place] [address]    Special Requests   Final    BOTTLES DRAWN AEROBIC AND ANAEROBIC Blood Culture results may not be optimal due to an excessive volume of blood received in culture bottles Performed at [facility] [address] [place], [place] [address]    Culture   Final    NO GROWTH 4 DAYS Performed at [facility], [place] [address] [place], [place] [address]    Report Status PENDING  Incomplete  Resp panel by RT-PCR (RSV, Flu A&B, Covid) Anterior Nasal Swab     Status: None   Collection Time: 09/08/22 10:21 PM   Specimen: Anterior Nasal Swab  Result Value Ref Range Status   SARS Coronavirus 2 by RT PCR NEGATIVE NEGATIVE Final    Comment: (NOTE) SARS-CoV-2 target nucleic acids are NOT DETECTED.  The SARS-CoV-2 RNA is generally detectable in upper respiratory specimens during the acute phase of infection. The lowest concentration of SARS-CoV-2 viral copies this assay can detect is 138 copies/mL. A negative result does not preclude SARS-Cov-2 infection and should not be used as the sole basis for treatment or other patient management decisions. A negative result may occur with  improper specimen collection/handling, submission of specimen other than nasopharyngeal swab, presence of viral mutation(s) within the areas targeted by this assay, and inadequate number of viral copies(<138 copies/mL). A negative result must be combined with clinical observations, patient history, and epidemiological information. The expected result is Negative.  Fact Sheet for Patients:  EntrepreneurPulse.com.au  Fact Sheet for Healthcare Providers:  IncredibleEmployment.be  This test is no t yet approved or cleared by the [place] FDA and  has been authorized for detection and/or diagnosis of SARS-CoV-2 by FDA under an Emergency Use Authorization (EUA). This EUA will remain  in effect (meaning this test can be  used) for the duration of the COVID-19 declaration under Section 564(b)(1) of the Act, 21 U.S.C.section 360bbb-3(b)(1), unless the authorization is terminated  or revoked sooner.       Influenza A by PCR NEGATIVE NEGATIVE Final   Influenza B by PCR NEGATIVE NEGATIVE Final    Comment: (NOTE) The Xpert Xpress SARS-CoV-2/FLU/RSV plus assay is intended as an aid in the diagnosis of influenza from Nasopharyngeal swab specimens and should not be used as a sole basis for treatment. Nasal washings and aspirates are unacceptable for Xpert Xpress SARS-CoV-2/FLU/RSV testing.  Fact Sheet  for Patients: EntrepreneurPulse.com.au  Fact Sheet for Healthcare Providers: IncredibleEmployment.be  This test is not yet approved or cleared by the [place] FDA and has been authorized for detection and/or diagnosis of SARS-CoV-2 by FDA under an Emergency Use Authorization (EUA). This EUA will remain in effect (meaning this test can be used) for the duration of the COVID-19 declaration under Section 564(b)(1) of the Act, 21 U.S.C. section 360bbb-3(b)(1), unless the authorization is terminated or revoked.     Resp Syncytial Virus by PCR NEGATIVE NEGATIVE Final    Comment: (NOTE) Fact Sheet for Patients: EntrepreneurPulse.com.au  Fact Sheet for Healthcare Providers: IncredibleEmployment.be  This test is not yet approved or cleared by the [place] FDA and has been authorized for detection and/or diagnosis of SARS-CoV-2 by FDA under an Emergency Use Authorization (EUA). This EUA will remain in effect (meaning this test can be used) for the duration of the COVID-19 declaration under Section 564(b)(1) of the Act, 21 U.S.C. section 360bbb-3(b)(1), unless the authorization is terminated or revoked.  Performed at [facility], [place] [address], [place], [place] [address]   Urine Culture     Status: None    Collection Time: 09/09/22 12:30 AM   Specimen: Urine, Random  Result Value Ref Range Status   Specimen Description   Final    URINE, RANDOM Performed at [facility] [address] [place], [place] [address]    Special Requests   Final    NONE Reflexed from [id number] Performed at [facility], [place] [address] [place], [place] [address]    Culture   Final    NO GROWTH Performed at [facility], [place] [address] [place], [place] [address]    Report Status 09/10/2022 FINAL  Final         Radiology Studies: No results found.      Scheduled Meds:  Chlorhexidine Gluconate Cloth  6 each Topical Daily   dexamethasone  2 mg Oral Q48H   feeding supplement  237 mL Oral TID BM   magic mouthwash  5 mL Oral QID   megestrol  160 mg Oral Daily   morphine  15 mg Oral Q12H   multivitamin with minerals  1 tablet Oral Daily   senna-docusate  1 tablet Oral BID   Continuous Infusions:     LOS: 4 days    Time spent: 45 minutes spent on chart review, discussion with nursing staff, consultants, updating family and interview/physical exam; more than 50% of that time was spent in counseling and/or coordination of care.    Geradine Girt, DO [facility] Available via Epic secure chat 7am-7pm After these hours, please refer to coverage provider listed on [email or web address] 09/12/2022, 11:47 AM

## 2022-09-13 LAB — CULTURE, BLOOD (ROUTINE X 2)
Culture: NO GROWTH
Culture: NO GROWTH

## 2022-09-21 ENCOUNTER — Telehealth: Payer: Self-pay

## 2022-09-21 ENCOUNTER — Inpatient Hospital Stay: Payer: BLUE CROSS/BLUE SHIELD

## 2022-09-21 NOTE — Telephone Encounter (Signed)
Called to check on status of patient due to upcoming follow up appointment. Per Peewee( spouse) patient expired on 09/20/2022. Dr. Sondra Come made aware.

## 2022-09-23 ENCOUNTER — Ambulatory Visit: Payer: Medicaid Other | Admitting: Radiation Oncology

## 2022-09-30 ENCOUNTER — Other Ambulatory Visit (HOSPITAL_COMMUNITY): Payer: Self-pay

## 2022-10-04 DEATH — deceased

## 2023-02-28 ENCOUNTER — Other Ambulatory Visit (HOSPITAL_COMMUNITY): Payer: Self-pay

## 2023-03-01 ENCOUNTER — Other Ambulatory Visit (HOSPITAL_BASED_OUTPATIENT_CLINIC_OR_DEPARTMENT_OTHER): Payer: Self-pay
